# Patient Record
Sex: Female | Born: 1955
Health system: Southern US, Community
[De-identification: ages and names within clinical notes are randomized; demographics above are authoritative.]

## PROBLEM LIST (undated history)

## (undated) DIAGNOSIS — C210 Malignant neoplasm of anus, unspecified: Secondary | ICD-10-CM

## (undated) DIAGNOSIS — C801 Malignant (primary) neoplasm, unspecified: Secondary | ICD-10-CM

## (undated) DIAGNOSIS — Z98811 Dental restoration status: Secondary | ICD-10-CM

## (undated) DIAGNOSIS — K219 Gastro-esophageal reflux disease without esophagitis: Secondary | ICD-10-CM

## (undated) DIAGNOSIS — M65331 Trigger finger, right middle finger: Secondary | ICD-10-CM

## (undated) DIAGNOSIS — Z923 Personal history of irradiation: Secondary | ICD-10-CM

## (undated) HISTORY — PX: TONSILLECTOMY: SUR1361

## (undated) HISTORY — DX: Malignant neoplasm of anus, unspecified: C21.0

## (undated) HISTORY — PX: TUBAL LIGATION: SHX77

## (undated) HISTORY — DX: Gastro-esophageal reflux disease without esophagitis: K21.9

## (undated) HISTORY — PX: ECTOPIC PREGNANCY SURGERY: SHX613

---

## 1999-01-05 ENCOUNTER — Other Ambulatory Visit: Admission: RE | Admit: 1999-01-05 | Discharge: 1999-01-05 | Payer: Self-pay | Admitting: Obstetrics & Gynecology

## 2000-02-07 ENCOUNTER — Other Ambulatory Visit: Admission: RE | Admit: 2000-02-07 | Discharge: 2000-02-07 | Payer: Self-pay | Admitting: Obstetrics & Gynecology

## 2001-10-15 ENCOUNTER — Other Ambulatory Visit: Admission: RE | Admit: 2001-10-15 | Discharge: 2001-10-15 | Payer: Self-pay | Admitting: Obstetrics & Gynecology

## 2002-03-29 ENCOUNTER — Encounter: Payer: Self-pay | Admitting: *Deleted

## 2002-03-29 ENCOUNTER — Ambulatory Visit (HOSPITAL_COMMUNITY): Admission: RE | Admit: 2002-03-29 | Discharge: 2002-03-29 | Payer: Self-pay | Admitting: *Deleted

## 2002-04-11 ENCOUNTER — Encounter: Payer: Self-pay | Admitting: Gastroenterology

## 2002-05-03 ENCOUNTER — Encounter: Payer: Self-pay | Admitting: Gastroenterology

## 2003-05-02 ENCOUNTER — Other Ambulatory Visit: Admission: RE | Admit: 2003-05-02 | Discharge: 2003-05-02 | Payer: Self-pay | Admitting: Obstetrics & Gynecology

## 2003-07-23 ENCOUNTER — Encounter: Payer: Self-pay | Admitting: Gastroenterology

## 2003-09-08 ENCOUNTER — Encounter: Payer: Self-pay | Admitting: Gastroenterology

## 2004-05-18 ENCOUNTER — Other Ambulatory Visit: Admission: RE | Admit: 2004-05-18 | Discharge: 2004-05-18 | Payer: Self-pay | Admitting: Obstetrics & Gynecology

## 2005-09-05 ENCOUNTER — Other Ambulatory Visit: Admission: RE | Admit: 2005-09-05 | Discharge: 2005-09-05 | Payer: Self-pay | Admitting: Obstetrics & Gynecology

## 2005-09-09 ENCOUNTER — Encounter: Payer: Self-pay | Admitting: Gastroenterology

## 2007-12-18 ENCOUNTER — Encounter
Admission: RE | Admit: 2007-12-18 | Discharge: 2007-12-18 | Payer: Self-pay | Admitting: Physical Medicine and Rehabilitation

## 2007-12-27 ENCOUNTER — Encounter
Admission: RE | Admit: 2007-12-27 | Discharge: 2007-12-27 | Payer: Self-pay | Admitting: Physical Medicine and Rehabilitation

## 2009-01-14 LAB — CONVERTED CEMR LAB: Pap Smear: NORMAL

## 2009-04-23 ENCOUNTER — Encounter (INDEPENDENT_AMBULATORY_CARE_PROVIDER_SITE_OTHER): Payer: Self-pay | Admitting: *Deleted

## 2009-04-23 ENCOUNTER — Emergency Department (HOSPITAL_BASED_OUTPATIENT_CLINIC_OR_DEPARTMENT_OTHER): Admission: EM | Admit: 2009-04-23 | Discharge: 2009-04-24 | Payer: Self-pay | Admitting: Emergency Medicine

## 2009-04-24 ENCOUNTER — Encounter (INDEPENDENT_AMBULATORY_CARE_PROVIDER_SITE_OTHER): Payer: Self-pay | Admitting: *Deleted

## 2009-04-24 ENCOUNTER — Ambulatory Visit: Payer: Self-pay | Admitting: Diagnostic Radiology

## 2009-06-08 ENCOUNTER — Ambulatory Visit: Payer: Self-pay | Admitting: Gastroenterology

## 2009-06-08 DIAGNOSIS — Z860100 Personal history of colon polyps, unspecified: Secondary | ICD-10-CM | POA: Insufficient documentation

## 2009-06-18 ENCOUNTER — Ambulatory Visit: Payer: Self-pay | Admitting: Internal Medicine

## 2009-06-18 DIAGNOSIS — K219 Gastro-esophageal reflux disease without esophagitis: Secondary | ICD-10-CM | POA: Insufficient documentation

## 2009-06-18 DIAGNOSIS — R059 Cough, unspecified: Secondary | ICD-10-CM | POA: Insufficient documentation

## 2009-06-18 DIAGNOSIS — R51 Headache: Secondary | ICD-10-CM | POA: Insufficient documentation

## 2009-06-18 DIAGNOSIS — R519 Headache, unspecified: Secondary | ICD-10-CM | POA: Insufficient documentation

## 2009-06-18 DIAGNOSIS — R05 Cough: Secondary | ICD-10-CM

## 2009-06-19 ENCOUNTER — Encounter: Payer: Self-pay | Admitting: Internal Medicine

## 2009-10-14 ENCOUNTER — Encounter: Payer: Self-pay | Admitting: Internal Medicine

## 2010-05-19 ENCOUNTER — Encounter: Payer: Self-pay | Admitting: Internal Medicine

## 2010-05-19 ENCOUNTER — Other Ambulatory Visit: Admission: RE | Admit: 2010-05-19 | Discharge: 2010-05-19 | Payer: Self-pay | Admitting: Internal Medicine

## 2010-05-19 ENCOUNTER — Telehealth: Payer: Self-pay | Admitting: Internal Medicine

## 2010-05-19 ENCOUNTER — Ambulatory Visit: Payer: Self-pay | Admitting: Internal Medicine

## 2010-05-19 DIAGNOSIS — R109 Unspecified abdominal pain: Secondary | ICD-10-CM | POA: Insufficient documentation

## 2010-05-19 LAB — CONVERTED CEMR LAB
BUN: 14 mg/dL (ref 6–23)
Basophils Absolute: 0 10*3/uL (ref 0.0–0.1)
Bilirubin, Direct: 0 mg/dL (ref 0.0–0.3)
Chloride: 108 meq/L (ref 96–112)
Cholesterol: 274 mg/dL — ABNORMAL HIGH (ref 0–200)
Creatinine, Ser: 0.7 mg/dL (ref 0.4–1.2)
Direct LDL: 174.8 mg/dL
Eosinophils Absolute: 0.1 10*3/uL (ref 0.0–0.7)
Leukocytes, UA: NEGATIVE
MCHC: 33.9 g/dL (ref 30.0–36.0)
MCV: 88.3 fL (ref 78.0–100.0)
Monocytes Absolute: 0.6 10*3/uL (ref 0.1–1.0)
Neutrophils Relative %: 63.7 % (ref 43.0–77.0)
Nitrite: NEGATIVE
Platelets: 276 10*3/uL (ref 150.0–400.0)
RDW: 14 % (ref 11.5–14.6)
Total Bilirubin: 0.6 mg/dL (ref 0.3–1.2)
Total Protein, Urine: NEGATIVE mg/dL
VLDL: 17.4 mg/dL (ref 0.0–40.0)
WBC: 6.2 10*3/uL (ref 4.5–10.5)
pH: 5 (ref 5.0–8.0)

## 2010-05-21 ENCOUNTER — Ambulatory Visit: Payer: Self-pay | Admitting: Internal Medicine

## 2010-05-21 LAB — CONVERTED CEMR LAB: Potassium: 4.5 meq/L (ref 3.5–5.1)

## 2010-05-24 ENCOUNTER — Encounter: Payer: Self-pay | Admitting: Internal Medicine

## 2010-08-22 ENCOUNTER — Encounter: Payer: Self-pay | Admitting: Physical Medicine and Rehabilitation

## 2010-09-02 NOTE — Letter (Signed)
Summary: [facility]  [facility]   Imported By: Sherian Rein 10/27/2009 09:12:37  _____________________________________________________________________  External Attachment:    Type:   Image     Comment:   External Document

## 2010-09-02 NOTE — Letter (Signed)
Summary: Lipid Letter  [facility]  [address] [place], [place] [address]   Phone: [phone]  Fax: [phone]    05/19/2010  Emily Compton [address] [place], [place]  [address]  Dear Emily Compton:  We have carefully reviewed your last lipid profile from  and the results are noted below with a summary of recommendations for lipid management.    Cholesterol:       274     Goal: <   HDL "good" Cholesterol:   57.84     Goal: >   LDL "bad" Cholesterol:       Goal: <   Triglycerides:       87.0     Goal: <        TLC Diet (Therapeutic Lifestyle Change): Saturated Fats & Transfatty acids should be kept < 7% of total calories ***Reduce Saturated Fats Polyunstaurated Fat can be up to 10% of total calories Monounsaturated Fat Fat can be up to 20% of total calories Total Fat should be no greater than 25-35% of total calories Carbohydrates should be 50-60% of total calories Protein should be approximately 15% of total calories Fiber should be at least 20-30 grams a day ***Increased fiber may help lower LDL Total Cholesterol should be < 200mg /day Consider adding plant stanol/sterols to diet (example: Benacol spread) ***A higher intake of unsaturated fat may reduce Triglycerides and Increase HDL    Adjunctive Measures (may lower LIPIDS and reduce risk of Heart Attack) include: Aerobic Exercise (20-30 minutes 3-4 times a week) Limit Alcohol Consumption Weight Reduction Aspirin 75-81 mg a day by mouth (if not allergic or contraindicated) Dietary Fiber 20-30 grams a day by mouth     Current Medications: 1)    Diazepam 10 Mg Tabs (Diazepam) .... 1/2  tablet by mouth as needed 2)    Oxycodone-acetaminophen 5-325 Mg Tabs (Oxycodone-acetaminophen) .... 1/2 tablet by mouth as needed for pain 3)    Prilosec Otc 20 Mg Tbec (Omeprazole magnesium) .... One tablet by mouth by mouth as needed for acid reflux  If you have any questions, please call. We appreciate being  able to work with you.   Sincerely,    [facility] Etta Grandchild MD

## 2010-09-02 NOTE — Letter (Signed)
Summary: Results Follow-up Letter  [facility]  [address] [place], [place] [address]   Phone: [phone]  Fax: [phone]    05/24/2010  [address] [place], [place]  [address]  Dear Ms. Ovidio Kin,   The following are the results of your recent test(s):  Test     Result     Pap Smear    Normal___XX____  Not Normal_____       Comments:    _________________________________________________________  Please call for an appointment as directed _________________________________________________________ _________________________________________________________ _________________________________________________________  Sincerely,  Sanda Linger MD [facility]

## 2010-09-02 NOTE — Progress Notes (Signed)
Summary: need lab visit.  ---- Converted from flag ---- ---- 05/19/2010 2:15 PM, Etta Grandchild MD wrote: yes  ---- 05/19/2010 2:15 PM, Rock Nephew CMA wrote: this week?  ---- 05/19/2010 1:53 PM, Etta Grandchild MD wrote: ask her to come in for a repeat K+ level  ---- 05/19/2010 1:50 PM, Rock Nephew CMA wrote: Per lab, pt potassium resulted a 6.3 ------------------------------  Phone Note Outgoing Call   Call placed by: Alysia Penna,  May 19, 2010 2:38 PM Call placed to: Patient Summary of Call: left message with husband to have pt call back.  Initial call taken by: Alysia Penna,  May 19, 2010 2:38 PM  Follow-up for Phone Call        Patient notified per MD and will come back and have lab recheck. Order placed in IDX.Marland KitchenAlvy Beal Archie CMA  May 19, 2010 2:58 PM

## 2010-09-02 NOTE — Letter (Signed)
Summary: Lipid Letter  [facility]  [address] [place], [place] [address]   Phone: [phone]  Fax: [phone]    05/19/2010  Emily Compton [address] [place], [place]  [address]  Dear Emily Compton:  We have carefully reviewed your last lipid profile from  and the results are noted below with a summary of recommendations for lipid management.    Cholesterol:       274     Goal: <200   HDL "good" Cholesterol:   16.01     Goal: >40   LDL "bad" Cholesterol:   175     Goal: <130   Triglycerides:       87.0     Goal: <150        TLC Diet (Therapeutic Lifestyle Change): Saturated Fats & Transfatty acids should be kept < 7% of total calories ***Reduce Saturated Fats Polyunstaurated Fat can be up to 10% of total calories Monounsaturated Fat Fat can be up to 20% of total calories Total Fat should be no greater than 25-35% of total calories Carbohydrates should be 50-60% of total calories Protein should be approximately 15% of total calories Fiber should be at least 20-30 grams a day ***Increased fiber may help lower LDL Total Cholesterol should be < 200mg /day Consider adding plant stanol/sterols to diet (example: Benacol spread) ***A higher intake of unsaturated fat may reduce Triglycerides and Increase HDL    Adjunctive Measures (may lower LIPIDS and reduce risk of Heart Attack) include: Aerobic Exercise (20-30 minutes 3-4 times a week) Limit Alcohol Consumption Weight Reduction Aspirin 75-81 mg a day by mouth (if not allergic or contraindicated) Dietary Fiber 20-30 grams a day by mouth     Current Medications: 1)    Diazepam 10 Mg Tabs (Diazepam) .... 1/2  tablet by mouth as needed 2)    Oxycodone-acetaminophen 5-325 Mg Tabs (Oxycodone-acetaminophen) .... 1/2 tablet by mouth as needed for pain 3)    Prilosec Otc 20 Mg Tbec (Omeprazole magnesium) .... One tablet by mouth by mouth as needed for acid reflux  If you have any questions, please call. We  appreciate being able to work with you.   Sincerely,    [facility] Emily Grandchild MD

## 2010-09-02 NOTE — Assessment & Plan Note (Signed)
Summary: Cpx/will come fasting, wants Chest xray/Cd   Vital Signs:  Patient profile:   55 year old female Menstrual status:  postmenopausal Height:      61 inches Weight:      122 pounds BMI:     23.14 O2 Sat:      98 % on Room air Temp:     97.6 degrees F oral Pulse rate:   68 / minute Pulse rhythm:   regular Resp:     16 per minute BP sitting:   100 / 68  (left arm) Cuff size:   large  Vitals Entered By: Rock Nephew CMA (May 19, 2010 8:13 AM)  O2 Flow:  Room air CC: Patient here for CPX w/ labs, Preventive Care Is Patient Diabetic? No Pain Assessment Patient in pain? no       Does patient need assistance? Functional Status Self care Ambulation Normal     Menstrual Status postmenopausal Last PAP Result Normal   Primary Care Emily Compton:  Etta Grandchild MD  CC:  Patient here for CPX w/ labs and Preventive Care.  History of Present Illness: She returns for a complete physical but she also complains of right posterior flank pain  along the lower rib cage for 2 weeks.  Dyspepsia History:      She has no alarm features of dyspepsia including no history of melena, hematochezia, dysphagia, persistent vomiting, or involuntary weight loss > 5%.  There is a prior history of GERD.  The patient does not have a prior history of documented ulcer disease.  The dominant symptom is heartburn or acid reflux.  An H-2 blocker medication is currently being taken.  She notes that the symptoms have improved with the H-2 blocker therapy.  Symptoms have not persisted after 4 weeks of H-2 blocker treatment.     Current Medications (verified): 1)  Diazepam 10 Mg Tabs (Diazepam) .... 1/2  Tablet By Mouth As Needed 2)  Oxycodone-Acetaminophen 5-325 Mg Tabs (Oxycodone-Acetaminophen) .... 1/2 Tablet By Mouth As Needed For Pain 3)  Prilosec Otc 20 Mg Tbec (Omeprazole Magnesium) .... One Tablet By Mouth By Mouth As Needed For Acid Reflux  Allergies (verified): No Known Drug  Allergies  Past History:  Past Medical History: Last updated: 06/18/2009 Adenomatous Colon Polyps Stenosis in back  Colonic polyps, hx of GERD Headache  Past Surgical History: Last updated: 06/18/2009 fallopian tube surgery  Tonsillectomy  Family History: Last updated: 06/18/2009 No FH of Colon Cancer: Family History Breast cancer 1st degree relative <50 Family History Uterine cancer  Social History: Last updated: 06/08/2009 Occupation: Office---Middle School Married No childern Patient has never smoked.  Alcohol Use - yes: one daily  Daily Caffeine Use: 2 daily  Illicit Drug Use - no  Risk Factors: Alcohol Use: 1 (06/18/2009) >5 drinks/d w/in last 3 months: no (06/18/2009)  Risk Factors: Smoking Status: never (06/18/2009)  Family History: Reviewed history from 06/18/2009 and no changes required. No FH of Colon Cancer: Family History Breast cancer 1st degree relative <50 Family History Uterine cancer  Social History: Reviewed history from 06/08/2009 and no changes required. 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  Alcohol Use - yes: one daily  Daily Caffeine Use: 2 daily  Illicit Drug Use - no  Review of Systems  The patient denies anorexia, fever, weight loss, weight gain, chest pain, syncope, dyspnea on exertion, peripheral edema, headaches, hemoptysis, abdominal pain, melena, hematochezia, severe indigestion/heartburn, hematuria, suspicious skin lesions, difficulty walking, depression, abnormal bleeding,  enlarged lymph nodes, angioedema, and breast masses.   GI:  Denies abdominal pain, bloody stools, constipation, diarrhea, excessive appetite, gas, indigestion, loss of appetite, nausea, vomiting, and yellowish skin color. GU:  Complains of dysuria and urinary hesitancy; denies abnormal vaginal bleeding, decreased libido, discharge, hematuria, incontinence, nocturia, and urinary frequency.  Physical  Exam  General:  alert, well-developed, well-nourished, well-hydrated, appropriate dress, normal appearance, healthy-appearing, cooperative to examination, good hygiene, and overweight-appearing.   Head:  normocephalic, atraumatic, no abnormalities observed, and no abnormalities palpated.   Eyes:  No corneal or conjunctival inflammation noted. EOMI. Perrla. Funduscopic exam benign, without hemorrhages, exudates or papilledema. Vision grossly normal. Mouth:  Oral mucosa and oropharynx without lesions or exudates.  Teeth in good repair. Neck:  supple, full ROM, no masses, no thyromegaly, no thyroid nodules or tenderness, no JVD, normal carotid upstroke, no cervical lymphadenopathy, and no neck tenderness.   Lungs:  normal respiratory effort, no intercostal retractions, no accessory muscle use, normal breath sounds, no dullness, no fremitus, no crackles, and no wheezes.   Heart:  normal rate, regular rhythm, no murmur, no gallop, no rub, and no JVD.   Abdomen:  soft, non-tender, normal bowel sounds, no distention, no masses, no guarding, no rigidity, no rebound tenderness, no abdominal hernia, no inguinal hernia, no hepatomegaly, and no splenomegaly.   Rectal:  No external abnormalities noted. Normal sphincter tone. No rectal masses or tenderness. Genitalia:  Normal introitus for age, no external lesions, no vaginal discharge, mucosa pink and moist, no vaginal or cervical lesions, no vaginal atrophy, no friaility or hemorrhage, normal uterus size and position, no adnexal masses or tenderness Msk:  No deformity or scoliosis noted of thoracic or lumbar spine.   Pulses:  R and L carotid,radial,femoral,dorsalis pedis and posterior tibial pulses are full and equal bilaterally Extremities:  No clubbing, cyanosis, edema, or deformity noted with normal full range of motion of all joints.   Neurologic:  No cranial nerve deficits noted. Station and gait are normal. Plantar reflexes are down-going bilaterally. DTRs  are symmetrical throughout. Sensory, motor and coordinative functions appear intact. Skin:  turgor normal, color normal, no rashes, no suspicious lesions, no ecchymoses, no petechiae, no purpura, no ulcerations, and no edema.   Cervical Nodes:  no anterior cervical adenopathy and no posterior cervical adenopathy.   Axillary Nodes:  no R axillary adenopathy and no L axillary adenopathy.   Inguinal Nodes:  no R inguinal adenopathy and no L inguinal adenopathy.   Psych:  Cognition and judgment appear intact. Alert and cooperative with normal attention span and concentration. No apparent delusions, illusions, hallucinations   Impression & Recommendations:  Problem # 1:  ROUTINE GENERAL MEDICAL EXAM@HEALTH  CARE FACL (ICD-V70.0) Assessment New  Orders: Venipuncture ([address]) TLB-Lipid Panel (80061-LIPID) TLB-BMP (Basic Metabolic Panel-BMET) (80048-METABOL) TLB-CBC Platelet - w/Differential (85025-CBCD) TLB-Hepatic/Liver Function Pnl (80076-HEPATIC) TLB-TSH (Thyroid Stimulating Hormone) (84443-TSH) TLB-Udip w/ Micro (81001-URINE) Hemoccult Guaiac-1 spec.(in office) ([address])  Mammogram: normal (04/01/2010) Pap smear: Normal (01/14/2009) Colonoscopy: Normal (09/09/2005) Td Booster: Historical (08/01/2006)   Flu Vax: Historical (05/12/2010)   Next mammogram due:: 04/2011 (05/19/2010)  Discussed using sunscreen, use of alcohol, drug use, self breast exam, routine dental care, routine eye care, schedule for GYN exam, routine physical exam, seat belts, multiple vitamins, osteoporosis prevention, adequate calcium intake in diet, recommendations for immunizations, mammograms and Pap smears.  Discussed exercise and checking cholesterol.    Problem # 2:  FLANK PAIN, RIGHT (ICD-789.09) Assessment: New will check for UTI, kidney stones, proteinuria, xray for rib  abnormalities Her updated medication list for this problem includes:    Oxycodone-acetaminophen 5-325 Mg Tabs (Oxycodone-acetaminophen) .Marland Kitchen...  1/2 tablet by mouth as needed for pain  Orders: Venipuncture ([address]) TLB-Lipid Panel (80061-LIPID) TLB-BMP (Basic Metabolic Panel-BMET) (80048-METABOL) TLB-CBC Platelet - w/Differential (85025-CBCD) TLB-Hepatic/Liver Function Pnl (80076-HEPATIC) TLB-TSH (Thyroid Stimulating Hormone) (84443-TSH) TLB-Udip w/ Micro (81001-URINE)  Problem # 3:  COUGH (ICD-786.2) Assessment: Improved  Orders: T-2 View CXR (71020TC)  Problem # 4:  GERD (ICD-530.81) Assessment: Improved  The following medications were removed from the medication list:    Hyomax-sl 0.125 Mg Subl (Hyoscyamine sulfate) .Marland Kitchen... Take 2 tablets sublingual q.4 h. p.r.n. Her updated medication list for this problem includes:    Prilosec Otc 20 Mg Tbec (Omeprazole magnesium) ..... One tablet by mouth by mouth as needed for acid reflux  Orders: Venipuncture (98119) TLB-Lipid Panel (80061-LIPID) TLB-BMP (Basic Metabolic Panel-BMET) (80048-METABOL) TLB-CBC Platelet - w/Differential (85025-CBCD) TLB-Hepatic/Liver Function Pnl (80076-HEPATIC) TLB-TSH (Thyroid Stimulating Hormone) (84443-TSH) TLB-Udip w/ Micro (81001-URINE) Hemoccult Guaiac-1 spec.(in office) (82270)  Complete Medication List: 1)  Diazepam 10 Mg Tabs (Diazepam) .... 1/2  tablet by mouth as needed 2)  Oxycodone-acetaminophen 5-325 Mg Tabs (Oxycodone-acetaminophen) .... 1/2 tablet by mouth as needed for pain 3)  Prilosec Otc 20 Mg Tbec (Omeprazole magnesium) .... One tablet by mouth by mouth as needed for acid reflux  Other Orders: Specimen Handling (14782)  Colorectal Screening:  Current Recommendations:    Hemoccult: NEG X 1 today; Given X 3    Colonoscopy recommended: scheduled with G.I.  Colonoscopy Results:    Date of Exam: 09/09/2005    Results: Normal  PAP Screening:    Hx Cervical Dysplasia in last 5 yrs? No    3 normal PAP smears in last 5 yrs? Yes    Last PAP smear:  01/14/2009    Reviewed PAP smear recommendations:  PAP smear  done  Mammogram Screening:    Last Mammogram:  04/01/2010    Reviewed Mammogram recommendations:  mammogram not due yet  Osteoporosis Risk Assessment:  Risk Factors for Fracture or Low Bone Density:   Race (White or Asian):     yes   Hx of Fractures:       no   FH of Osteoporosis:     no   Hx of falls:       no   Physically inactive:     no   Smoking status:       never   High alcohol use:     no   Low calcium/Vit. D intake:   no   Corticosteroid use:     no   Heparin use:       no   Thyroid disease:     no   Rheumatoid Arthritis:     no  Immunization & Chemoprophylaxis:    Tetanus vaccine: Historical  (08/01/2006)    Influenza vaccine: Historical  (05/12/2010)  Patient Instructions: 1)  Please schedule a follow-up appointment in 2 months. 2)  It is important that you exercise regularly at least 20 minutes 5 times a week. If you develop chest pain, have severe difficulty breathing, or feel very tired , stop exercising immediately and seek medical attention. 3)  You need to lose weight. Consider a lower calorie diet and regular exercise.  4)  Schedule your mammogram. 5)  Schedule a colonoscopy/sigmoidoscopy to help detect colon cancer. 6)  You need to have a Pap Smear to prevent cervical cancer.   Orders Added: 1)  T-2 View  CXR [71020TC] 2)  Venipuncture [36415] 3)  TLB-Lipid Panel [80061-LIPID] 4)  TLB-BMP (Basic Metabolic Panel-BMET) [80048-METABOL] 5)  TLB-CBC Platelet - w/Differential [85025-CBCD] 6)  TLB-Hepatic/Liver Function Pnl [80076-HEPATIC] 7)  TLB-TSH (Thyroid Stimulating Hormone) [84443-TSH] 8)  TLB-Udip w/ Micro [81001-URINE] 9)  Specimen Handling [99000] 10)  Hemoccult Guaiac-1 spec.(in office) [82270] 11)  Est. Patient Level IV [[address]] 12)  Est. Patient 40-64 years [99396]   Immunization History:  Influenza Immunization History:    Influenza:  historical (05/12/2010)   Immunization History:  Influenza Immunization History:    Influenza:   Historical (05/12/2010)  Preventive Care Screening  Mammogram:    Date:  04/01/2010    Next Due:  04/2011    Results:  normal

## 2010-09-23 ENCOUNTER — Emergency Department (HOSPITAL_COMMUNITY)
Admission: EM | Admit: 2010-09-23 | Discharge: 2010-09-23 | Disposition: A | Discharge status: Home / Self Care | Payer: BC Managed Care – PPO | Attending: Emergency Medicine | Admitting: Emergency Medicine

## 2010-09-23 DIAGNOSIS — R5381 Other malaise: Secondary | ICD-10-CM | POA: Insufficient documentation

## 2010-09-23 DIAGNOSIS — R002 Palpitations: Secondary | ICD-10-CM | POA: Insufficient documentation

## 2010-09-23 DIAGNOSIS — R42 Dizziness and giddiness: Secondary | ICD-10-CM | POA: Insufficient documentation

## 2010-09-23 LAB — BASIC METABOLIC PANEL
CO2: 25 mEq/L (ref 19–32)
GFR calc Af Amer: >60 mL/min (ref >60)
GFR calc non Af Amer: >60 mL/min (ref >60)
Glucose, Bld: 100 mg/dL — ABNORMAL HIGH (ref 70–99)
Potassium: 3.9 mEq/L (ref 3.5–5.1)
Sodium: 138 mEq/L (ref 135–145)

## 2010-09-23 LAB — POCT CARDIAC MARKERS: CKMB, poc: <1 ng/mL — ABNORMAL LOW (ref 1.0–8.0)

## 2010-09-23 LAB — DIFFERENTIAL
Basophils Absolute: 0 10*3/uL (ref 0.0–0.1)
Eosinophils Relative: 1 % (ref 0–5)
Lymphocytes Relative: 22 % (ref 12–46)
Lymphs Abs: 1.9 10*3/uL (ref 0.7–4.0)
Neutro Abs: 6.1 10*3/uL (ref 1.7–7.7)

## 2010-09-23 LAB — CBC
HCT: 40.2 % (ref 36.0–46.0)
Hemoglobin: 13 g/dL (ref 12.0–15.0)
MCV: 85.9 fL (ref 78.0–100.0)
RDW: 13 % (ref 11.5–15.5)
WBC: 8.8 10*3/uL (ref 4.0–10.5)

## 2010-09-28 ENCOUNTER — Encounter: Payer: Self-pay | Admitting: Internal Medicine

## 2010-10-12 NOTE — Letter (Signed)
Summary: [facility]  [facility]   Imported By: Sherian Rein 10/05/2010 15:02:39  _____________________________________________________________________  External Attachment:    Type:   Image     Comment:   External Document

## 2010-10-29 ENCOUNTER — Ambulatory Visit: Payer: Self-pay | Admitting: Gastroenterology

## 2010-11-05 LAB — DIFFERENTIAL
Basophils Absolute: 0 10*3/uL (ref 0.0–0.1)
Lymphocytes Relative: 36 % (ref 12–46)
Lymphs Abs: 2.3 10*3/uL (ref 0.7–4.0)
Monocytes Absolute: 0.6 10*3/uL (ref 0.1–1.0)
Neutro Abs: 3.5 10*3/uL (ref 1.7–7.7)

## 2010-11-05 LAB — COMPREHENSIVE METABOLIC PANEL
AST: 28 U/L (ref 0–37)
Albumin: 4.5 g/dL (ref 3.5–5.2)
BUN: 12 mg/dL (ref 6–23)
Calcium: 10.2 mg/dL (ref 8.4–10.5)
Chloride: 100 mEq/L (ref 96–112)
Creatinine, Ser: 0.8 mg/dL (ref 0.4–1.2)
GFR calc Af Amer: >60 mL/min (ref >60)
GFR calc non Af Amer: >60 mL/min (ref >60)
Total Bilirubin: 0.5 mg/dL (ref 0.3–1.2)

## 2010-11-05 LAB — URINE CULTURE: Colony Count: 2000

## 2010-11-05 LAB — URINE MICROSCOPIC-ADD ON

## 2010-11-05 LAB — URINALYSIS, ROUTINE W REFLEX MICROSCOPIC
Bilirubin Urine: NEGATIVE
Glucose, UA: NEGATIVE mg/dL
Hgb urine dipstick: NEGATIVE
Specific Gravity, Urine: 1.003 — ABNORMAL LOW (ref 1.005–1.030)
Urobilinogen, UA: 0.2 mg/dL (ref 0.0–1.0)
pH: 6.5 (ref 5.0–8.0)

## 2010-11-05 LAB — CBC
HCT: 40.5 % (ref 36.0–46.0)
MCHC: 33.5 g/dL (ref 30.0–36.0)
MCV: 88.5 fL (ref 78.0–100.0)
Platelets: 274 10*3/uL (ref 150–400)
WBC: 6.5 10*3/uL (ref 4.0–10.5)

## 2010-11-05 LAB — LIPASE, BLOOD: Lipase: 75 U/L (ref 23–300)

## 2010-11-10 ENCOUNTER — Encounter: Payer: Self-pay | Admitting: Internal Medicine

## 2010-11-10 ENCOUNTER — Ambulatory Visit (INDEPENDENT_AMBULATORY_CARE_PROVIDER_SITE_OTHER): Payer: BC Managed Care – PPO | Admitting: Internal Medicine

## 2010-11-10 VITALS — BP 106/72 | HR 59 | Temp 97.8°F | Resp 12 | Ht 61.0 in | Wt 133.2 lb

## 2010-11-10 DIAGNOSIS — M549 Dorsalgia, unspecified: Secondary | ICD-10-CM

## 2010-11-10 DIAGNOSIS — M48061 Spinal stenosis, lumbar region without neurogenic claudication: Secondary | ICD-10-CM

## 2010-11-10 MED ORDER — OXYCODONE-ACETAMINOPHEN 5-325 MG PO TABS: 1.0000 | ORAL_TABLET | Freq: Four times a day (QID) | ORAL | Status: DC | PRN

## 2010-11-10 MED ORDER — DIAZEPAM 10 MG PO TABS: 10.0000 mg | ORAL_TABLET | Freq: Four times a day (QID) | ORAL | Status: DC | PRN

## 2010-11-10 NOTE — Patient Instructions (Signed)
Back Pain & Injury Your back pain is most likely caused by a strain of the muscles or ligaments supporting the spine. Back strains cause pain and trouble moving because of muscle spasms. They may take several weeks to heal. Usually they are better in days.  Treatment for back pain includes:  Rest - Get bed rest as needed over the next day or two. Use a firm mattress and lie on your side with your knees slightly bent. If you lie on your back, put a pillow under your knees.   Early movement - Back pain improves most rapidly if you remain active. It is much more stressful on the back to sit or stand in one place. Do not sit, drive or stand in one place for more than 30 minutes at a time. Take short walks on level surfaces as soon as pain allows.   Limit bending and lifting - Do not bend over or lift anything over 20 pounds until instructed otherwise. Lift by bending your knees. Use your leg muscles to help. Keep the load close to your body and avoid twisting. Do not reach or do overhead work.   Medicines - Medicine to reduce pain and inflammation are helpful. Muscle-relaxing drugs may be prescribed.   Therapy - Put ice packs on your back every few hours for the first 2-3 days after your injury or as instructed. After that ice or heat may be alternated to reduce pain and spasm. Back exercises and gentle massage may be of some benefit. You should be examined again if your back pain is not better in one week.  SEEK IMMEDIATE MEDICAL CARE IF:  You have pain that radiates from your back into your legs.   You develop new bowel or bladder control problems.   You have unusual weakness or numbness in your arms or legs.   You develop nausea or vomiting.   You develop abdominal pain.   You feel faint.  Document Released: 07/18/2005 Document Re-Released: 04/26/2008 [employer] Patient Information 2011 [employer]. 

## 2010-11-10 NOTE — Progress Notes (Signed)
  Subjective:    Patient ID: Emily Compton, female    DOB: September 10, 1955, 54 y.o.   MRN: [id number]  HPI She returns for f/up and she tells me that her pain doctor does not prescribe meds anymore and he sent her to me for pain med prescriptions. Her low back pain is stable by her report and she is getting significant relief with ESI's but she does occasionally take meds for pain and spasm when she is very active.    Review of Systems  Constitutional: Negative for fever, chills, diaphoresis, activity change, appetite change, fatigue and unexpected weight change.  Respiratory: Negative for cough, shortness of breath, wheezing and stridor.   Cardiovascular: Negative for chest pain, palpitations and leg swelling.  Gastrointestinal: Negative for nausea, vomiting, abdominal pain, diarrhea, constipation, blood in stool and abdominal distention.  Genitourinary: Negative for dysuria, urgency, frequency, hematuria, flank pain, decreased urine volume, difficulty urinating and dyspareunia.  Musculoskeletal: Positive for back pain. Negative for joint swelling, arthralgias and gait problem.  Skin: Negative for color change, pallor and rash.  Neurological: Negative for dizziness, tremors, weakness, numbness and headaches.  Hematological: Negative for adenopathy. Does not bruise/bleed easily.  Psychiatric/Behavioral: Negative for hallucinations, behavioral problems, confusion, self-injury, dysphoric mood, decreased concentration and agitation. The patient is not nervous/anxious.    Lab Results  Component Value Date   WBC 8.8 09/23/2010   HGB 13.0 09/23/2010   HCT 40.2 09/23/2010   PLT 247 09/23/2010   CHOL 274* 05/19/2010   TRIG 87.0 05/19/2010   HDL 77.80 05/19/2010   LDLDIRECT 174.8 05/19/2010   ALT 24 05/19/2010   AST 26 05/19/2010   NA 138 09/23/2010   K 3.9 09/23/2010   CL 107 09/23/2010   CREATININE 0.69 09/23/2010   BUN 9 09/23/2010   CO2 25 09/23/2010   TSH 1.45 05/19/2010      Objective:   Physical Exam  Constitutional: She appears well-developed and well-nourished. No distress.  HENT:  Head: Normocephalic and atraumatic.  Right Ear: External ear normal.  Left Ear: External ear normal.  Nose: Nose normal.  Mouth/Throat: No oropharyngeal exudate.  Eyes: Conjunctivae and EOM are normal. Pupils are equal, round, and reactive to light. Right eye exhibits no discharge. Left eye exhibits no discharge. No scleral icterus.  Neck: Normal range of motion. Neck supple. No JVD present. No thyromegaly present.  Cardiovascular: Normal rate, regular rhythm, normal heart sounds and intact distal pulses.  Exam reveals no gallop and no friction rub.   No murmur heard. Pulmonary/Chest: Effort normal and breath sounds normal. No respiratory distress. She has no wheezes. She has no rales. She exhibits no tenderness.  Abdominal: Soft. Bowel sounds are normal. She exhibits no distension and no mass. There is no tenderness. There is no rebound and no guarding.  Musculoskeletal: Normal range of motion. She exhibits no tenderness.  Lymphadenopathy:    She has no cervical adenopathy.  Neurological: She is alert. She has normal reflexes. She displays normal reflexes. She exhibits normal muscle tone. Coordination normal.  Skin: Skin is warm and dry. No rash noted. She is not diaphoretic. No erythema. No pallor.  Psychiatric: She has a normal mood and affect. Her behavior is normal. Judgment and thought content normal.          Assessment & Plan:

## 2010-11-11 DIAGNOSIS — M48061 Spinal stenosis, lumbar region without neurogenic claudication: Secondary | ICD-10-CM | POA: Insufficient documentation

## 2010-11-11 NOTE — Assessment & Plan Note (Signed)
Continue meds for low back pain and let me know if she develops any new or worsening symptoms

## 2010-11-11 NOTE — Assessment & Plan Note (Signed)
Prescriptions were written

## 2011-02-01 ENCOUNTER — Telehealth: Payer: Self-pay

## 2011-02-01 NOTE — Telephone Encounter (Signed)
Patient called requesting refill for pain medication. She asked is there something similar to the OXY 5-325 that does not contain APAP.  She is concerned about too much bleeding. Please advise Thanks

## 2011-02-01 NOTE — Telephone Encounter (Signed)
PT NEEDS RF of OXY 5-325  Spoke w/pt - advised her to keep apt w/GI. Also told her that she is currently taking a low dose of APAP and this is safer than NSAID w/her concern about rectal bleeding. She sometimes takes asprin when oxy does not cover pain - suggested she may want to try tylenol (not to exceed 3000 mg/day) in place of Asprin w/current concerns. She will keep apt with GI and call office w/further concerns.

## 2011-02-02 ENCOUNTER — Other Ambulatory Visit: Payer: Self-pay | Admitting: Internal Medicine

## 2011-02-02 DIAGNOSIS — M549 Dorsalgia, unspecified: Secondary | ICD-10-CM

## 2011-02-02 DIAGNOSIS — M48061 Spinal stenosis, lumbar region without neurogenic claudication: Secondary | ICD-10-CM

## 2011-02-02 MED ORDER — OXYCODONE-ACETAMINOPHEN 5-325 MG PO TABS: 1.0000 | ORAL_TABLET | Freq: Four times a day (QID) | ORAL | Status: DC | PRN

## 2011-02-02 NOTE — Telephone Encounter (Signed)
done

## 2011-02-03 NOTE — Telephone Encounter (Signed)
Patient informed. 

## 2011-02-09 ENCOUNTER — Telehealth: Payer: Self-pay | Admitting: *Deleted

## 2011-02-09 NOTE — Telephone Encounter (Signed)
Pt left vm - Says that pharmacy has a problem/question w/percocet RX written last week. Spoke w/pharm and clarified sig per what patient takes 1/2 to 1 q 6 prn.

## 2011-02-14 ENCOUNTER — Ambulatory Visit
Admission: RE | Admit: 2011-02-14 | Discharge: 2011-02-14 | Disposition: A | Discharge status: Home / Self Care | Payer: BC Managed Care – PPO | Source: Ambulatory Visit | Attending: Orthopedic Surgery | Admitting: Orthopedic Surgery

## 2011-02-14 ENCOUNTER — Other Ambulatory Visit: Payer: Self-pay | Admitting: Orthopedic Surgery

## 2011-02-14 DIAGNOSIS — M545 Low back pain, unspecified: Secondary | ICD-10-CM

## 2011-02-21 ENCOUNTER — Ambulatory Visit: Payer: Self-pay | Admitting: Gastroenterology

## 2011-02-24 ENCOUNTER — Encounter (HOSPITAL_COMMUNITY)
Admission: RE | Admit: 2011-02-24 | Discharge: 2011-02-24 | Disposition: A | Discharge status: Home / Self Care | Payer: BC Managed Care – PPO | Source: Ambulatory Visit | Attending: Orthopedic Surgery | Admitting: Orthopedic Surgery

## 2011-02-24 LAB — CBC
HCT: 41.7 % (ref 36.0–46.0)
Hemoglobin: 14.1 g/dL (ref 12.0–15.0)
MCV: 84.2 fL (ref 78.0–100.0)
WBC: 6.7 10*3/uL (ref 4.0–10.5)

## 2011-02-24 LAB — URINALYSIS, ROUTINE W REFLEX MICROSCOPIC
Bilirubin Urine: NEGATIVE
Hgb urine dipstick: NEGATIVE
Ketones, ur: NEGATIVE mg/dL
Nitrite: NEGATIVE
Protein, ur: NEGATIVE mg/dL
Urobilinogen, UA: 0.2 mg/dL (ref 0.0–1.0)

## 2011-02-24 LAB — PROTIME-INR
INR: 0.95 (ref 0.00–1.49)
Prothrombin Time: 12.9 seconds (ref 11.6–15.2)

## 2011-02-24 LAB — DIFFERENTIAL
Basophils Absolute: 0 10*3/uL (ref 0.0–0.1)
Lymphocytes Relative: 33 % (ref 12–46)
Lymphs Abs: 2.2 10*3/uL (ref 0.7–4.0)
Neutro Abs: 3.4 10*3/uL (ref 1.7–7.7)

## 2011-02-24 LAB — COMPREHENSIVE METABOLIC PANEL
Albumin: 4.1 g/dL (ref 3.5–5.2)
Alkaline Phosphatase: 93 U/L (ref 39–117)
BUN: 11 mg/dL (ref 6–23)
Calcium: 10.4 mg/dL (ref 8.4–10.5)
Creatinine, Ser: 0.75 mg/dL (ref 0.50–1.10)
GFR calc Af Amer: >60 mL/min (ref >60)
Glucose, Bld: 93 mg/dL (ref 70–99)
Potassium: 4 mEq/L (ref 3.5–5.1)
Total Protein: 7.3 g/dL (ref 6.0–8.3)

## 2011-02-24 LAB — APTT: aPTT: 29 seconds (ref 24–37)

## 2011-02-24 LAB — TYPE AND SCREEN
ABO/RH(D): A POS
Antibody Screen: NEGATIVE

## 2011-02-24 LAB — SURGICAL PCR SCREEN: MRSA, PCR: NEGATIVE

## 2011-02-25 LAB — URINE CULTURE: Culture: NO GROWTH

## 2011-03-02 ENCOUNTER — Inpatient Hospital Stay (HOSPITAL_COMMUNITY): Payer: BC Managed Care – PPO

## 2011-03-02 ENCOUNTER — Inpatient Hospital Stay (HOSPITAL_COMMUNITY)
Admission: RE | Admit: 2011-03-02 | Discharge: 2011-03-04 | DRG: 755 | Disposition: A | Discharge status: Home / Self Care | Payer: BC Managed Care – PPO | Source: Ambulatory Visit | Attending: Orthopedic Surgery | Admitting: Orthopedic Surgery

## 2011-03-02 DIAGNOSIS — Q762 Congenital spondylolisthesis: Secondary | ICD-10-CM

## 2011-03-02 DIAGNOSIS — J449 Chronic obstructive pulmonary disease, unspecified: Secondary | ICD-10-CM | POA: Diagnosis present

## 2011-03-02 DIAGNOSIS — Z01812 Encounter for preprocedural laboratory examination: Secondary | ICD-10-CM

## 2011-03-02 DIAGNOSIS — J4489 Other specified chronic obstructive pulmonary disease: Secondary | ICD-10-CM | POA: Diagnosis present

## 2011-03-02 DIAGNOSIS — K219 Gastro-esophageal reflux disease without esophagitis: Secondary | ICD-10-CM | POA: Diagnosis present

## 2011-03-02 DIAGNOSIS — IMO0002 Reserved for concepts with insufficient information to code with codable children: Secondary | ICD-10-CM | POA: Diagnosis present

## 2011-03-02 DIAGNOSIS — M48061 Spinal stenosis, lumbar region without neurogenic claudication: Principal | ICD-10-CM | POA: Diagnosis present

## 2011-03-02 HISTORY — PX: LUMBAR FUSION: SHX111

## 2011-03-08 NOTE — Op Note (Signed)
NAME:  Emily Compton, Emily Compton NO.:  [id number]  MEDICAL RECORD NO.:  [id number]  LOCATION:  5021                         FACILITY:  [facility]  PHYSICIAN:  Estill Bamberg, MD      DATE OF BIRTH:  03/14/1956  DATE OF PROCEDURE:  03/02/2011 DATE OF DISCHARGE:                              OPERATIVE REPORT   PREOPERATIVE DIAGNOSES: 1. L4-L5 spinal stenosis. 2. Grade 1 L4-L5 spondylolisthesis. 3. Lateral recess and neural foraminal stenosis on the right side at     L4-L5, causing left-sided L4 and L5 radiculopathy.  POSTOPERATIVE DIAGNOSES: 1. L4-L5 spinal stenosis. 2. Grade 1 L4-L5 spondylolisthesis. 3. Lateral recess and neural foraminal stenosis on the right side at     L4-L5, causing left-sided L4 and L5 radiculopathy.  PROCEDURES: 1. Right-sided transforaminal lumbar interbody fusion L4-L5. 2. Insertion of intervertebral device L4-L5. 3. Placement of posterior instrumentation L4, L5. 4. Left-sided posterolateral fusion L4-L5. 5. Use of local autograft. 6. Intraoperative bone marrow aspiration using a separate incision     from the patient's left iliac crest. 7. Intraoperative use of fluoroscopy.  SURGEON:  Estill Bamberg, MD  ASSISTANT:  Janace Litten, OPA  ANESTHESIA:  General endotracheal anesthesia.  COMPLICATIONS:  None.  DISPOSITION:  Stable.  ESTIMATED BLOOD LOSS:  200 mL.  DRAINS:  Blake x1.  INDICATIONS FOR PROCEDURE:  Briefly, Ms. Osterberg is an extremely pleasant 55 year old female who initially presented to my office in February 2012, with right greater than left leg pain.  The patient was treated with conservative measures including injections, anti- inflammatories, and physical therapy.  She did go on to have improvement of her pain, but then her pain did recur to a rather severe degree.  I did review an MRI which is notable for a grade 1 L4-L5 spondylolisthesis with abundant fluid noted in the L4-L5 facet joints bilaterally.  There was also  noted to be a right-sided L4-L5 disk herniation causing lateral recess neural foraminal stenosis in addition to spinal stenosis.  After extensive discussion with the patient, we did agree to go forward with a right-sided L4-L5 transforaminal lumbar interbody fusion.  The patient fully understood the risks and limitations of the procedure as outlined in my preoperative note.  OPERATIVE DETAILS:  On March 02, 2011, the patient was brought to Surgery and general endotracheal anesthesia was administered.  The patient was placed prone on a well-padded Wilson frame.  SCDs were placed.  All bony prominences were meticulously padded.  Ancef was given.  The back was then prepped and draped in usual sterile fashion. I did place two 18-gauge spinal needles over approximately the L4 and L5 spinous processes.  This did help me optimize the location of my incision.  I then made an incision from approximately spinous process of L3 to approximately spinous process of L5.  The paraspinal musculature was gently swept laterally bilaterally.  Self-retaining retractors were placed.  I then exposed the posterior elements of L4-L5 and the transverse processes of L4 and L5 bilaterally.  It was readily noted that the bilateral facet joints were noted to be significantly hypertrophied.  The overgrowth of the facet joints were removed using a rongeur.  The posterolateral gutters were then packed with  Ray-Tec soaked with thrombin.  I then went forward with cannulating the L4 and L5 screws.  I did use a 4-mm bur to gain access to the pedicles on both the right and the left sides.  I then used a curved gearshift probe and at the L4  pedicle, I tapped the hole using a 5-mm tap.  A 6-mm tap was used at L5.  I then placed bone wax over the holes.  This was done bilaterally.  I then turned my attention towards the decompressive aspect of the procedure.  Using an osteotome, I did remove the inferior aspect of L4 and the  inferior articular process of L4.  I then used a Kerrison to remove the superior articular process of L5.  The ligamentum flavum was then removed.  After doing so, I was readily able to identify the intervertebral disk and the traversing L5 nerve and the exiting L4 nerve.  It was also readily apparent that there was a large-sized disk herniation located in the neural foraminal region and this was removed using a pituitary.  With assistant holding medial retraction on the traversing L5 nerve, I did use a 15 blade knife to perform an annulotomy and I did use a series of curettes and paddle scrapers to perform a thorough diskectomy.  Various pituitaries were used remove to multiple fragments of disk.  I was very happy with the final diskectomy and I did encounter punctate bleeding bone at the L4 and L5 endplates.  I then went forward to placing a series of trials.  I did feel that a 13-mm trial would be the most appropriate fit.  At this point, I made a separate incision over the patient's left iliac crest and a total of 16 mL of bone marrow aspirate was obtained.  This was mixed with 20 mL of Vitoss BA.  The Vitoss BA was mixed with the autograft obtained from the decompression and the intervertebral space was abundantly packed with bone graft and Vitoss BA with bone marrow aspirate.  I then packed the 13-mm interbody spacer with the bone marrow aspirate/Vitoss/autograft mixture and this was tamped into position under live fluoroscopy.  I was very happy with the final press fit.  I then packed the remainder of the interbody space with the Vitoss and autograft.  I then placed L4 and L5 screws on the right side.  6 x 45-mm screws were placed at L4 and 7 x 45- mm screws were placed at L5.  I then placed a 35-mm rod and a gentle compression was applied across the rod and a final tightening procedure was performed.  I then turned my attention towards the patient's left side.  At this point, I  copiously irrigated the wound with approximately 1 L of normal saline.  I then used a high-speed bur to decorticate the transverse processes of L4 and L5 in addition to the posterior elements of L4 and L5 in the lateral aspect of the pars interarticularis.  I then placed L4 and L5 screws as previously noted.  Prior to doing so, I did place remainder of the Vitoss/bone marrow aspirate/autograft mixture in the posterolateral gutter and across the posterior elements.  I then placed the screws and a 35-mm rod was placed.  I did perform a gentle compression maneuver across the interspace and a final locking procedure was performed.  I was very happy with the final appearance of the construct on both AP and lateral fluoroscopic views.  At this point, a  Blake drain was placed overlying the epidural space.  A #1 Vicryl was used to close the fascia, 2-0 Vicryl was used to close the subcutaneous layer, and 3-0 Monocryl was used to close the skin and the separate incision for the iliac crest on the left side.  I was very happy with the final appearance on both AP and lateral fluoroscopic views.  All instrument counts were correct at the termination of the procedure.  Of note, Janace Litten was my assistant throughout the procedure and aided in essential retraction and suctioning required throughout the procedure.     Estill Bamberg, MD     MD/MEDQ  D:  03/02/2011  T:  03/02/2011  Job:  [id number]  cc:   Sanda Linger, MD  Electronically Signed by Estill Bamberg  on 03/08/2011 05:44:50 PM

## 2011-03-08 NOTE — Discharge Summary (Signed)
  NAME:  Emily Compton, Emily Compton NO.:  [id number]  MEDICAL RECORD NO.:  [id number]  LOCATION:  5021                         FACILITY:  [facility]  PHYSICIAN:  Estill Bamberg, MD      DATE OF BIRTH:  August 06, 1955  DATE OF ADMISSION:  03/02/2011 DATE OF DISCHARGE:                              DISCHARGE SUMMARY   ADMISSION DIAGNOSIS:  L4-5 spinal stenosis with a grade 1 L4-5 spondylolisthesis.  DISCHARGE DIAGNOSIS:  L4-5 spinal stenosis with a grade 1 L4-5 spondylolisthesis.  ADMITTING PHYSICIAN:  Estill Bamberg, MD  ADMISSION HISTORY:  Briefly, [name] is a pleasant 55 year old female, who presented to me with severe pain in bilateral legs.  This was felt to be secondary to spinal stenosis.  We did go forward with conservative care and the patient did improve.  However, the patient went on to have a recurrence of her left-sided leg pain.  After extensive conservative care, we did make a decision to go forward with surgical intervention.  Specifically, the patient was admitted on March 02, 2011, for a left-sided L4-5 transforaminal lumbar interbody fusion with instrumentation.  HOSPITAL COURSE:  On March 02, 2011, the patient was admitted to the hospital and underwent the procedure noted above.  The patient tolerated the procedure well and was transferred to recovery in stable condition. On the morning of postoperative day #1, the patient reported complete resolution of her left leg pain.  She did have some minimal back discomfort, but did make a excellent gains with physical therapy.  On the morning of postoperative day #2, the patient was noted to be neurovascularly intact and her pain was very well-controlled with oral pain medications.  The patient was doing extremely well with physical therapy and we did make a decision to go ahead and discharge her home on the morning of postoperative day #2.  DISCHARGE INSTRUCTIONS:  The patient will take Percocet for pain  and Valium for spasms.  She will adhere to back precautions at all times.  I will see her back in my office in approximately 2 weeks for followup. She understands to contact me at any point prior to then with any questions or concerns.     Estill Bamberg, MD     MD/MEDQ  D:  03/04/2011  T:  03/04/2011  Job:  [id number]  cc:   Sanda Linger, MD  Electronically Signed by Estill Bamberg  on 03/08/2011 05:44:51 PM

## 2011-05-10 ENCOUNTER — Encounter: Payer: Self-pay | Admitting: Gastroenterology

## 2011-05-10 ENCOUNTER — Ambulatory Visit (INDEPENDENT_AMBULATORY_CARE_PROVIDER_SITE_OTHER): Payer: BC Managed Care – PPO | Admitting: Gastroenterology

## 2011-05-10 DIAGNOSIS — K625 Hemorrhage of anus and rectum: Secondary | ICD-10-CM | POA: Insufficient documentation

## 2011-05-10 DIAGNOSIS — M549 Dorsalgia, unspecified: Secondary | ICD-10-CM

## 2011-05-10 MED ORDER — PEG-KCL-NACL-NASULF-NA ASC-C 100 G PO SOLR: 1.0000 | Freq: Once | ORAL | Status: DC

## 2011-05-10 NOTE — Assessment & Plan Note (Signed)
Improve following surgery for spinal stenosis

## 2011-05-10 NOTE — Patient Instructions (Signed)
Colonoscopy A colonoscopy is an exam to evaluate your entire colon. In this exam, your colon is cleansed. A long fiberoptic tube is inserted through your rectum and into your colon. The fiberoptic scope (endoscope) is a long bundle of enclosed and very flexible fibers. These fibers transmit light to the area examined and send images from that area to your caregiver. Discomfort is usually minimal. You may be given a drug to help you sleep (sedative) during or prior to the procedure. This exam helps to detect lumps (tumors), polyps, inflammation, and areas of bleeding. Your caregiver may also take a small piece of tissue (biopsy) that will be examined under a microscope. BEFORE THE PROCEDURE  A clear liquid diet may be required for 2 days before the exam.   Liquid injections (enemas) or laxatives may be required.   A large amount of electrolyte solution may be given to you to drink over a short period of time. This solution is used to clean out your colon.   You should be present 1 prior to your procedure or as directed by your caregiver.   Check in at the admissions desk to fill out necessary forms if not preregistered. There will be consent forms to sign prior to the procedure. If accompanied by friends or family, there is a waiting area for them while you are having your procedure.  LET YOUR CAREGIVER KNOW ABOUT:  Allergies to food or medicine.  Medicines taken, including vitamins, herbs, eyedrops, over-the-counter medicines, and creams.   Use of steroids (by mouth or creams).   Previous problems with anesthetics or numbing medicines.   History of bleeding problems or blood clots.  Previous surgery.   Other health problems, including diabetes and kidney problems.   Possibility of pregnancy, if this applies.   AFTER THE PROCEDURE  If you received a sedative and/or pain medicine, you will need to arrange for someone to drive you home.   Occasionally, there is a little blood passed  with the first bowel movement. DO NOT be concerned.  HOME CARE INSTRUCTIONS  It is not unusual to pass moderate amounts of gas and experience mild abdominal cramping following the procedure. This is due to air being used to inflate your colon during the exam. Walking or a warm pack on your belly (abdomen) may help.   You may resume all normal meals and activities after sedatives and medicines have worn off.   Only take over-the-counter or prescription medicines for pain, discomfort, or fever as directed by your caregiver. DO NOT use aspirin or blood thinners if a biopsy was taken. Consult your caregiver for medicine usage if biopsies were taken.  FINDING OUT THE RESULTS OF YOUR TEST Not all test results are available during your visit. If your test results are not back during the visit, make an appointment with your caregiver to find out the results. Do not assume everything is normal if you have not heard from your caregiver or the medical facility. It is important for you to follow up on all of your test results. SEEK IMMEDIATE MEDICAL CARE IF:  You have an oral temperature above 100, not controlled by medicine.   You pass large blood clots or fill a toilet with blood following the procedure. This may also occur 10 to 14 days following the procedure. This is more likely if a biopsy was taken.   You develop abdominal pain that keeps getting worse and cannot be relieved with medicine.  Document Released: 07/15/2000 Document Re-Released: 10/12/2009   [employer] Patient Information 2011 [employer]. 

## 2011-05-10 NOTE — Progress Notes (Signed)
History of Present Illness:  Mrs. Longhi has returned for evaluation of rectal bleeding.   Over the past 6 months she has noted intermittent bright red blood per rectum consisting of blood on the toilet tissue. She is without abdominal or rectal pain or change in bowel habits. She has a history of adenomatous colon polyps. Last colonoscopy 2007 was normal. She has requested followup colonoscopy for bleeding.    Review of Systems: Pertinent positive and negative review of systems were noted in the above HPI section. All other review of systems were otherwise negative.    Current Medications, Allergies, Past Medical History, Past Surgical History, Family History and Social History were reviewed in [employer] [facility] electronic medical record  Vital signs were reviewed in today's medical record. Physical Exam: General: Well developed , well nourished, no acute distress Head: Normocephalic and atraumatic Eyes:  sclerae anicteric, EOMI Ears: Normal auditory acuity Mouth: No deformity or lesions Lungs: Clear throughout to auscultation Heart: Regular rate and rhythm; no murmurs, rubs or bruits Abdomen: Soft, non tender and non distended. No masses, hepatosplenomegaly or hernias noted. Normal Bowel sounds Rectal: There are no external rectal abnormalities Musculoskeletal: Symmetrical with no gross deformities  Pulses:  Normal pulses noted Extremities: No clubbing, cyanosis, edema or deformities noted Neurological: Alert oriented x 4, grossly nonfocal Psychological:  Alert and cooperative. Normal mood and affect

## 2011-05-10 NOTE — Assessment & Plan Note (Signed)
Patient's bleeding is most likely due to hemorrhoids. In view of the patient's history of adenomatous polyps and her request for colonoscopy I will proceed with this procedure.  Risks, alternatives, and complications of the procedure, including bleeding, perforation, and possible need for surgery, were explained to the patient.  Patient's questions were answered.

## 2011-05-11 ENCOUNTER — Other Ambulatory Visit: Payer: Self-pay | Admitting: Dermatology

## 2011-05-12 LAB — HM MAMMOGRAPHY: HM Mammogram: NORMAL

## 2011-06-07 ENCOUNTER — Encounter: Payer: Self-pay | Admitting: Gastroenterology

## 2011-06-07 ENCOUNTER — Ambulatory Visit (AMBULATORY_SURGERY_CENTER): Payer: BC Managed Care – PPO | Admitting: Gastroenterology

## 2011-06-07 DIAGNOSIS — Z1211 Encounter for screening for malignant neoplasm of colon: Secondary | ICD-10-CM

## 2011-06-07 DIAGNOSIS — Z86010 Personal history of colonic polyps: Secondary | ICD-10-CM

## 2011-06-07 DIAGNOSIS — M549 Dorsalgia, unspecified: Secondary | ICD-10-CM

## 2011-06-07 DIAGNOSIS — K5730 Diverticulosis of large intestine without perforation or abscess without bleeding: Secondary | ICD-10-CM

## 2011-06-07 DIAGNOSIS — K625 Hemorrhage of anus and rectum: Secondary | ICD-10-CM

## 2011-06-07 MED ORDER — SODIUM CHLORIDE 0.9 % IV SOLN: 500.0000 mL | INTRAVENOUS | Status: DC

## 2011-06-07 MED ORDER — HYDROCORTISONE ACETATE 25 MG RE SUPP: 25.0000 mg | Freq: Two times a day (BID) | RECTAL | Status: DC

## 2011-06-08 ENCOUNTER — Telehealth: Payer: Self-pay

## 2011-06-08 NOTE — Telephone Encounter (Signed)
Left message

## 2011-08-04 ENCOUNTER — Ambulatory Visit (INDEPENDENT_AMBULATORY_CARE_PROVIDER_SITE_OTHER)
Admission: RE | Admit: 2011-08-04 | Discharge: 2011-08-04 | Disposition: A | Discharge status: Home / Self Care | Payer: BC Managed Care – PPO | Source: Ambulatory Visit | Attending: Internal Medicine | Admitting: Internal Medicine

## 2011-08-04 ENCOUNTER — Telehealth: Payer: Self-pay | Admitting: Internal Medicine

## 2011-08-04 ENCOUNTER — Encounter: Payer: Self-pay | Admitting: Internal Medicine

## 2011-08-04 ENCOUNTER — Ambulatory Visit (INDEPENDENT_AMBULATORY_CARE_PROVIDER_SITE_OTHER): Payer: BC Managed Care – PPO | Admitting: Internal Medicine

## 2011-08-04 VITALS — BP 118/74 | HR 96 | Temp 99.0°F | Resp 16

## 2011-08-04 DIAGNOSIS — J209 Acute bronchitis, unspecified: Secondary | ICD-10-CM

## 2011-08-04 DIAGNOSIS — R05 Cough: Secondary | ICD-10-CM | POA: Insufficient documentation

## 2011-08-04 DIAGNOSIS — Z23 Encounter for immunization: Secondary | ICD-10-CM

## 2011-08-04 DIAGNOSIS — R059 Cough, unspecified: Secondary | ICD-10-CM | POA: Insufficient documentation

## 2011-08-04 MED ORDER — CEFUROXIME AXETIL 500 MG PO TABS: 500.0000 mg | ORAL_TABLET | Freq: Two times a day (BID) | ORAL | Status: AC

## 2011-08-04 MED ORDER — PSEUDOEPH-CHLORPHEN-HYDROCOD 60-4-5 MG/5ML PO SOLN: 5.0000 mL | Freq: Four times a day (QID) | ORAL | Status: DC | PRN

## 2011-08-04 NOTE — Telephone Encounter (Signed)
yes

## 2011-08-04 NOTE — Telephone Encounter (Signed)
The pt called and is requesting a same day appt for fever and cough.  Please advise if i can double book.   Thanks!

## 2011-08-04 NOTE — Patient Instructions (Signed)

## 2011-08-05 ENCOUNTER — Encounter: Payer: Self-pay | Admitting: Internal Medicine

## 2011-08-05 NOTE — Assessment & Plan Note (Signed)
Start ceftin for the infection and zutripro for the cough

## 2011-08-05 NOTE — Progress Notes (Signed)
Subjective:    Patient ID: Emily Compton, female    DOB: [birth year], 56 y.o.   MRN: [id number]  Cough This is a new problem. Episode onset: for 2-3 weeks. The problem has been gradually worsening. The problem occurs every few hours. The cough is productive of purulent sputum. Associated symptoms include chills, a fever, myalgias, nasal congestion, postnasal drip, a sore throat and sweats. Pertinent negatives include no chest pain, ear congestion, ear pain, headaches, heartburn, hemoptysis, rash, rhinorrhea, shortness of breath, weight loss or wheezing. The symptoms are aggravated by nothing. She has tried OTC cough suppressant for the symptoms. The treatment provided no relief.      Review of Systems  Constitutional: Positive for fever and chills. Negative for weight loss, diaphoresis, activity change, appetite change, fatigue and unexpected weight change.  HENT: Positive for congestion, sore throat, voice change and postnasal drip. Negative for hearing loss, ear pain, facial swelling, rhinorrhea, sneezing, drooling, mouth sores, trouble swallowing, neck pain, neck stiffness, dental problem, sinus pressure, tinnitus and ear discharge.   Eyes: Negative.   Respiratory: Positive for cough. Negative for apnea, hemoptysis, choking, chest tightness, shortness of breath, wheezing and stridor.   Cardiovascular: Negative for chest pain, palpitations and leg swelling.  Gastrointestinal: Negative for heartburn, nausea, vomiting, abdominal pain, diarrhea, constipation and blood in stool.  Genitourinary: Negative for dysuria, urgency, frequency, hematuria, decreased urine volume, enuresis, difficulty urinating and dyspareunia.  Musculoskeletal: Positive for myalgias. Negative for back pain, joint swelling, arthralgias and gait problem.  Skin: Negative for color change, pallor, rash and wound.  Neurological: Negative for dizziness, tremors, seizures, syncope, facial asymmetry, speech difficulty,  weakness, light-headedness, numbness and headaches.  Hematological: Negative for adenopathy. Does not bruise/bleed easily.  Psychiatric/Behavioral: Negative.        Objective:   Physical Exam  Vitals reviewed. Constitutional: She is oriented to person, place, and time. She appears well-developed and well-nourished. No distress.  HENT:  Head: Normocephalic and atraumatic. No trismus in the jaw.  Right Ear: Hearing, tympanic membrane, external ear and ear canal normal.  Left Ear: Hearing, tympanic membrane, external ear and ear canal normal.  Nose: Mucosal edema and rhinorrhea present. No nose lacerations, sinus tenderness, nasal deformity, septal deviation or nasal septal hematoma. No epistaxis.  No foreign bodies. Right sinus exhibits no maxillary sinus tenderness and no frontal sinus tenderness. Left sinus exhibits no maxillary sinus tenderness and no frontal sinus tenderness.  Mouth/Throat: Mucous membranes are normal. Mucous membranes are not pale, not dry and not cyanotic. No uvula swelling. Posterior oropharyngeal erythema present. No oropharyngeal exudate, posterior oropharyngeal edema or tonsillar abscesses.  Eyes: Conjunctivae are normal. Right eye exhibits no discharge. Left eye exhibits no discharge. No scleral icterus.  Neck: Normal range of motion. Neck supple. No JVD present. No tracheal deviation present. No thyromegaly present.  Cardiovascular: Normal rate, regular rhythm, normal heart sounds and intact distal pulses.  Exam reveals no gallop and no friction rub.   No murmur heard. Pulmonary/Chest: Effort normal and breath sounds normal. No stridor. No respiratory distress. She has no wheezes. She has no rales. She exhibits no tenderness.  Abdominal: Soft. Bowel sounds are normal. She exhibits no distension and no mass. There is no tenderness. There is no rebound and no guarding.  Musculoskeletal: Normal range of motion. She exhibits no edema and no tenderness.  Lymphadenopathy:      She has no cervical adenopathy.  Neurological: She is oriented to person, place, and time.  Skin: Skin is warm and  dry. No rash noted. She is not diaphoretic. No erythema. No pallor.  Psychiatric: She has a normal mood and affect. Her behavior is normal. Judgment and thought content normal.          Assessment & Plan:

## 2011-08-05 NOTE — Assessment & Plan Note (Signed)
I will check a CXR to look for pna, mass, edema, etc. 

## 2011-09-07 ENCOUNTER — Other Ambulatory Visit: Payer: Self-pay | Admitting: Internal Medicine

## 2011-09-08 ENCOUNTER — Telehealth: Payer: Self-pay | Admitting: *Deleted

## 2011-09-08 MED ORDER — DIAZEPAM 10 MG PO TABS: 10.0000 mg | ORAL_TABLET | Freq: Four times a day (QID) | ORAL | Status: DC | PRN

## 2011-09-08 NOTE — Telephone Encounter (Signed)
Pt called left msg on vm pharmacy stated diazepam was denied and she was wanting to know reason. Ask Dr. Yetta Barre pt last seen md 08/04/11. Per md was ok to call in 6 months worth.Called pharmacy spoke with Eugi/pharmacist and, notified pt will send refills into pharmacy... 09/08/11@2 :58pm/LMB

## 2011-09-27 ENCOUNTER — Other Ambulatory Visit: Payer: Self-pay | Admitting: Gastroenterology

## 2011-12-31 DIAGNOSIS — M65331 Trigger finger, right middle finger: Secondary | ICD-10-CM

## 2011-12-31 HISTORY — DX: Trigger finger, right middle finger: M65.331

## 2012-01-03 ENCOUNTER — Other Ambulatory Visit: Payer: Self-pay | Admitting: Orthopedic Surgery

## 2012-01-05 ENCOUNTER — Encounter (HOSPITAL_BASED_OUTPATIENT_CLINIC_OR_DEPARTMENT_OTHER): Payer: Self-pay | Admitting: *Deleted

## 2012-01-05 ENCOUNTER — Other Ambulatory Visit: Payer: Self-pay | Admitting: Orthopedic Surgery

## 2012-01-12 ENCOUNTER — Encounter (HOSPITAL_BASED_OUTPATIENT_CLINIC_OR_DEPARTMENT_OTHER): Payer: Self-pay | Admitting: Anesthesiology

## 2012-01-12 ENCOUNTER — Encounter (HOSPITAL_BASED_OUTPATIENT_CLINIC_OR_DEPARTMENT_OTHER): Payer: Self-pay | Admitting: Orthopedic Surgery

## 2012-01-12 ENCOUNTER — Encounter (HOSPITAL_BASED_OUTPATIENT_CLINIC_OR_DEPARTMENT_OTHER): Payer: Self-pay | Admitting: *Deleted

## 2012-01-12 ENCOUNTER — Ambulatory Visit (HOSPITAL_BASED_OUTPATIENT_CLINIC_OR_DEPARTMENT_OTHER)
Admission: RE | Admit: 2012-01-12 | Discharge: 2012-01-12 | Disposition: A | Discharge status: Home / Self Care | Payer: BC Managed Care – PPO | Source: Ambulatory Visit | Attending: Orthopedic Surgery | Admitting: Orthopedic Surgery

## 2012-01-12 ENCOUNTER — Encounter (HOSPITAL_BASED_OUTPATIENT_CLINIC_OR_DEPARTMENT_OTHER)
Admission: RE | Disposition: A | Discharge status: Home / Self Care | Payer: Self-pay | Source: Ambulatory Visit | Attending: Orthopedic Surgery

## 2012-01-12 ENCOUNTER — Ambulatory Visit (HOSPITAL_BASED_OUTPATIENT_CLINIC_OR_DEPARTMENT_OTHER): Payer: BC Managed Care – PPO | Admitting: Anesthesiology

## 2012-01-12 DIAGNOSIS — M6530 Trigger finger, unspecified finger: Secondary | ICD-10-CM | POA: Insufficient documentation

## 2012-01-12 DIAGNOSIS — K219 Gastro-esophageal reflux disease without esophagitis: Secondary | ICD-10-CM | POA: Insufficient documentation

## 2012-01-12 HISTORY — PX: TRIGGER FINGER RELEASE: SHX641

## 2012-01-12 HISTORY — DX: Dental restoration status: Z98.811

## 2012-01-12 HISTORY — DX: Trigger finger, right middle finger: M65.331

## 2012-01-12 SURGERY — RELEASE, A1 PULLEY, FOR TRIGGER FINGER
Anesthesia: Monitor Anesthesia Care | Site: Hand | Laterality: Right | Wound class: Clean

## 2012-01-12 MED ORDER — METOCLOPRAMIDE HCL 5 MG/ML IJ SOLN: 10.0000 mg | Freq: Once | INTRAMUSCULAR | Status: DC | PRN

## 2012-01-12 MED ORDER — CHLORHEXIDINE GLUCONATE 4 % EX LIQD: 60.0000 mL | Freq: Once | CUTANEOUS | Status: DC

## 2012-01-12 MED ORDER — ONDANSETRON HCL 4 MG/2ML IJ SOLN
INTRAMUSCULAR | Status: DC | PRN
  Administered 2012-01-12: 4 mg via INTRAVENOUS

## 2012-01-12 MED ORDER — FENTANYL CITRATE 0.05 MG/ML IJ SOLN
INTRAMUSCULAR | Status: DC | PRN
  Administered 2012-01-12: 50 ug via INTRAVENOUS

## 2012-01-12 MED ORDER — OXYCODONE-ACETAMINOPHEN 5-325 MG PO TABS: ORAL_TABLET | ORAL | Status: DC

## 2012-01-12 MED ORDER — PROPOFOL 10 MG/ML IV EMUL: INTRAVENOUS | Status: DC | PRN

## 2012-01-12 MED ORDER — LACTATED RINGERS IV SOLN
INTRAVENOUS | Status: DC
  Administered 2012-01-12 (×2): via INTRAVENOUS

## 2012-01-12 MED ORDER — PROPOFOL 10 MG/ML IV EMUL
INTRAVENOUS | Status: DC | PRN
  Administered 2012-01-12: 65 ug/kg/min via INTRAVENOUS

## 2012-01-12 MED ORDER — LIDOCAINE HCL (CARDIAC) 20 MG/ML IV SOLN
INTRAVENOUS | Status: DC | PRN
  Administered 2012-01-12: 10 mg via INTRAVENOUS

## 2012-01-12 MED ORDER — OXYCODONE HCL 5 MG PO TABS: 5.0000 mg | ORAL_TABLET | Freq: Once | ORAL | Status: DC | PRN

## 2012-01-12 MED ORDER — CEFAZOLIN SODIUM 1-5 GM-% IV SOLN
1.0000 g | INTRAVENOUS | Status: AC
  Administered 2012-01-12: 1 g via INTRAVENOUS

## 2012-01-12 MED ORDER — LIDOCAINE HCL (PF) 0.5 % IJ SOLN
INTRAMUSCULAR | Status: DC | PRN
  Administered 2012-01-12: 30 mL via INTRATHECAL

## 2012-01-12 MED ORDER — FENTANYL CITRATE 0.05 MG/ML IJ SOLN: 25.0000 ug | INTRAMUSCULAR | Status: DC | PRN

## 2012-01-12 MED ORDER — MIDAZOLAM HCL 5 MG/5ML IJ SOLN
INTRAMUSCULAR | Status: DC | PRN
  Administered 2012-01-12: 1 mg via INTRAVENOUS

## 2012-01-12 MED ORDER — BUPIVACAINE HCL (PF) 0.25 % IJ SOLN
INTRAMUSCULAR | Status: DC | PRN
  Administered 2012-01-12: 5 mL

## 2012-01-12 SURGICAL SUPPLY — 38 items
BANDAGE COBAN STERILE 2 (GAUZE/BANDAGES/DRESSINGS) ×2 IMPLANT
BANDAGE CONFORM 2  STR LF (GAUZE/BANDAGES/DRESSINGS) ×2 IMPLANT
BLADE MINI RND TIP GREEN BEAV (BLADE) IMPLANT
BLADE SURG 15 STRL LF DISP TIS (BLADE) ×2 IMPLANT
BLADE SURG 15 STRL SS (BLADE) ×4
BNDG CMPR 9X4 STRL LF SNTH (GAUZE/BANDAGES/DRESSINGS)
BNDG CMPR MD 5X2 ELC HKLP STRL (GAUZE/BANDAGES/DRESSINGS)
BNDG ELASTIC 2 VLCR STRL LF (GAUZE/BANDAGES/DRESSINGS) IMPLANT
BNDG ESMARK 4X9 LF (GAUZE/BANDAGES/DRESSINGS) IMPLANT
CHLORAPREP W/TINT 26ML (MISCELLANEOUS) ×2 IMPLANT
CLOTH BEACON ORANGE TIMEOUT ST (SAFETY) ×2 IMPLANT
CORDS BIPOLAR (ELECTRODE) ×2 IMPLANT
COVER MAYO STAND STRL (DRAPES) ×2 IMPLANT
COVER TABLE BACK 60X90 (DRAPES) ×2 IMPLANT
CUFF TOURNIQUET SINGLE 18IN (TOURNIQUET CUFF) ×2 IMPLANT
DRAPE EXTREMITY T 121X128X90 (DRAPE) ×2 IMPLANT
DRAPE SURG 17X23 STRL (DRAPES) ×2 IMPLANT
GAUZE XEROFORM 1X8 LF (GAUZE/BANDAGES/DRESSINGS) ×2 IMPLANT
GLOVE BIO SURGEON STRL SZ7.5 (GLOVE) ×3 IMPLANT
GLOVE BIOGEL M 7.0 STRL (GLOVE) ×1 IMPLANT
GLOVE BIOGEL PI IND STRL 7.5 (GLOVE) ×1 IMPLANT
GLOVE BIOGEL PI INDICATOR 7.5 (GLOVE) ×1
GOWN PREVENTION PLUS XLARGE (GOWN DISPOSABLE) ×2 IMPLANT
GOWN STRL REIN XL XLG (GOWN DISPOSABLE) ×2 IMPLANT
NDL HYPO 25X1 1.5 SAFETY (NEEDLE) IMPLANT
NEEDLE HYPO 25X1 1.5 SAFETY (NEEDLE) ×2 IMPLANT
NS IRRIG 1000ML POUR BTL (IV SOLUTION) ×2 IMPLANT
PACK BASIN DAY SURGERY FS (CUSTOM PROCEDURE TRAY) ×2 IMPLANT
PADDING CAST ABS 4INX4YD NS (CAST SUPPLIES) ×1
PADDING CAST ABS COTTON 4X4 ST (CAST SUPPLIES) ×1 IMPLANT
SPONGE GAUZE 4X4 12PLY (GAUZE/BANDAGES/DRESSINGS) ×2 IMPLANT
STOCKINETTE 4X48 STRL (DRAPES) ×2 IMPLANT
SUT ETHILON 4 0 PS 2 18 (SUTURE) ×2 IMPLANT
SYR BULB 3OZ (MISCELLANEOUS) ×2 IMPLANT
SYR CONTROL 10ML LL (SYRINGE) ×1 IMPLANT
TOWEL OR 17X24 6PK STRL BLUE (TOWEL DISPOSABLE) ×3 IMPLANT
UNDERPAD 30X30 INCONTINENT (UNDERPADS AND DIAPERS) ×2 IMPLANT
WATER STERILE IRR 1000ML POUR (IV SOLUTION) ×1 IMPLANT

## 2012-01-12 NOTE — Op Note (Signed)
Dictation [id number]

## 2012-01-12 NOTE — Discharge Instructions (Addendum)

## 2012-01-12 NOTE — Op Note (Signed)
NAME:  Emily Compton, Emily Compton NO.:  [id number]  MEDICAL RECORD NO.:  [id number]  LOCATION:                                 FACILITY:  PHYSICIAN:  Betha Loa, MD             DATE OF BIRTH:  DATE OF PROCEDURE:  01/12/2012 DATE OF DISCHARGE:                              OPERATIVE REPORT   PREOPERATIVE DIAGNOSIS:  Right long finger trigger digit.  POSTOP DIAGNOSIS:  Right long finger trigger digit.  PROCEDURE:  Right long finger trigger release.  SURGEON:  Betha Loa, MD.  ASSISTANT:  None.  ANESTHESIA:  Bier block.  IV FLUIDS:  Per anesthesia flow sheet.  ESTIMATED BLOOD LOSS:  Minimal.  COMPLICATIONS:  None.  SPECIMENS:  None.  TOURNIQUET TIME:  31 minutes.  DISPOSITION:  Stable to PACU.  INDICATIONS:  Ms. Mogg is a 56 year old right-hand dominant female, who has had triggering of the right long finger for many months.  She has tried injections twice with relief after the first but recurrence of the triggering.  She wished to have a right long finger trigger digit release for management of symptoms.  Risks, benefits, alternatives of surgery were discussed including the risk of blood loss, infection, damage to nerves, vessels, tendons, ligaments, bone, failure of surgery, need for additional surgery, complications with wound healing, continued pain, continued triggering.  She voiced understanding of these risks and elected to proceed.  OPERATIVE COURSE:  After being identified preoperatively by myself, the Patient and I agreed upon procedure and site of procedure.  Surgical site was marked.  The risks, benefits, alternatives of surgery were reviewed, and she wished to proceed.  Surgical consent was signed.  She was given 1 g of IV Ancef as preoperative antibiotic prophylaxis.  She was transferred to the operative room, placed on the operating table in supine position with the right upper extremity on arm board.  Bier block anesthesia was induced by  the anesthesiologist.  Right upper extremity was prepped and draped in the normal sterile orthopedic fashion.  A surgical pause was performed between surgeons, anesthesia, operating staff, and all were in agreement with the patient, procedure, and site of procedure.  Incision was made between the proximal and distal palmar flexion creases.  This was carried into subcutaneous tissues by spreading technique.  The flexor tendons  were easily identified.  The Radial and ulnar neurovascular bundles were protected throughout the case.  The A1 pulley was identified and incised sharply.  The proximal aspect of the A2 pulley was incised for a couple of millimeters to aid in tendon gliding.  The tendons were expressed through the wound, and there was no triggering.  The patient was awoken enough to flex her hand down into a tight fist.  She was able to make a complete fist.  She held this tightly and then slowly opened her fingers, and there was no triggering. She was able to fully extend her fingers.  The wound was copiously irrigated.  It was closed with 4-0 nylon in a horizontal mattress fashion.  She was injected with 5 mL of 0.25% plain Marcaine to aid in postoperative analgesia.  It was then dressed with  sterile Xeroform, 4x4s, and wrapped with a Coban dressing lightly.  Tourniquet was deflated at 31 minutes.  The fingertips were pink with brisk capillary refill after deflation of tourniquet.  Operative drapes were broken down.  The patient was awoken from anesthesia safely.  She was transferred back to stretcher and taken to PACU in stable condition.  I will see her back in the office in 1 week for postoperative followup.  I will give Percocet 5/325 one to two p.o. q.6 hours p.r.n. pain, dispensed #30.     Betha Loa, MD     KK/MEDQ  D:  01/12/2012  T:  01/12/2012  Job:  [id number]

## 2012-01-12 NOTE — Anesthesia Preprocedure Evaluation (Signed)
Anesthesia Evaluation  Patient identified by MRN, date of birth, ID band Patient awake    Reviewed: Allergy & Precautions, H&P , NPO status , Patient's Chart, lab work & pertinent test results, reviewed documented beta blocker date and time   Airway Mallampati: II TM Distance: >3 FB Neck ROM: full    Dental   Pulmonary neg pulmonary ROS,          Cardiovascular negative cardio ROS      Neuro/Psych  Neuromuscular disease negative psych ROS   GI/Hepatic Neg liver ROS, GERD-  Medicated and Controlled,  Endo/Other  negative endocrine ROS  Renal/GU negative Renal ROS  negative genitourinary   Musculoskeletal   Abdominal   Peds  Hematology negative hematology ROS (+)   Anesthesia Other Findings See surgeon's H&P   Reproductive/Obstetrics negative OB ROS                           Anesthesia Physical Anesthesia Plan  ASA: I  Anesthesia Plan: MAC and Bier Block   Post-op Pain Management:    Induction: Intravenous  Airway Management Planned: Simple Face Mask  Additional Equipment:   Intra-op Plan:   Post-operative Plan:   Informed Consent: I have reviewed the patients History and Physical, chart, labs and discussed the procedure including the risks, benefits and alternatives for the proposed anesthesia with the patient or authorized representative who has indicated his/her understanding and acceptance.   Dental Advisory Given  Plan Discussed with: CRNA and Surgeon  Anesthesia Plan Comments:         Anesthesia Quick Evaluation

## 2012-01-12 NOTE — Transfer of Care (Signed)
Immediate Anesthesia Transfer of Care Note  Patient: Emily Compton  Procedure(s) Performed: Procedure(s) (LRB): RELEASE TRIGGER FINGER/A-1 PULLEY (Right)  Patient Location: PACU  Anesthesia Type: Bier block  Level of Consciousness: awake, alert , oriented and patient cooperative  Airway & Oxygen Therapy: Patient Spontanous Breathing and Patient connected to face mask oxygen  Post-op Assessment: Report given to PACU RN and Post -op Vital signs reviewed and stable  Post vital signs: Reviewed and stable  Complications: No apparent anesthesia complications

## 2012-01-12 NOTE — Anesthesia Postprocedure Evaluation (Signed)
Anesthesia Post Note  Patient: Emily Compton  Procedure(s) Performed: Procedure(s) (LRB): RELEASE TRIGGER FINGER/A-1 PULLEY (Right)  Anesthesia type: MAC  Patient location: PACU  Post pain: Pain level controlled  Post assessment: Patient's Cardiovascular Status Stable  Last Vitals:  Filed Vitals:   01/12/12 1100  BP: 107/58  Pulse: 60  Temp: 36.7 C  Resp: 14    Post vital signs: Reviewed and stable  Level of consciousness: alert  Complications: No apparent anesthesia complications

## 2012-01-12 NOTE — H&P (Signed)
  Emily Compton is an 56 y.o. female.   Chief Complaint: right long trigger digit HPI: 56 yo rhd female with triggering of right long finger.  Very bothersome to her.  Has had injections x 2 with relief from first, but recurrence of triggering.  She would like a trigger release of the right long finger.  Past Medical History  Diagnosis Date  . Dental crowns present     and caps  . Trigger middle finger of right hand 12/2011  . GERD (gastroesophageal reflux disease)     prn OTC    Past Surgical History  Procedure Date  . Tubal ligation   . Tonsillectomy age 45  . Lumbar fusion 03/02/2011    right transforaminal lumbar fusion L4-5; left posterolateral fusion L4-5    Family History  Problem Relation Age of Onset  . Breast cancer Mother   . Uterine cancer Mother   . Colon cancer Neg Hx   . Brain cancer Mother    Social History:  reports that she has never smoked. She has never used smokeless tobacco. She reports that she drinks alcohol. She reports that she does not use illicit drugs.  Allergies:  Allergies  Allergen Reactions  . Adhesive (Tape) Rash    Medications Prior to Admission  Medication Sig Dispense Refill  . Multiple Vitamin (MULTIVITAMIN) tablet Take 1 tablet by mouth daily.      Marland Kitchen omeprazole (PRILOSEC OTC) 20 MG tablet Take 20 mg by mouth daily. Take one tablet as needed for acid reflux         Results for orders placed during the hospital encounter of 01/12/12 (from the past 48 hour(s))  POCT HEMOGLOBIN-HEMACUE     Status: Normal   Collection Time   01/12/12  8:12 AM      Component Value Range Comment   Hemoglobin 13.7  12.0 - 15.0 g/dL     No results found.   A comprehensive review of systems was negative except for: Eyes: positive for contacts/glasses  Blood pressure 105/76, pulse 73, temperature 97.8 F (36.6 C), temperature source Oral, resp. rate 16, height 5\' 1"  (1.549 m), weight 56.246 kg (124 lb), SpO2 97.00%.  General appearance: alert,  cooperative and appears stated age Head: Normocephalic, without obvious abnormality, atraumatic Neck: supple, symmetrical, trachea midline Resp: clear to auscultation bilaterally Cardio: regular rate and rhythm GI: soft, non-tender; bowel sounds normal; no masses,  no organomegaly Extremities: light touch sensation and capillary refill intact all digits.  +epl/fpl/io.  ttp volar aspect right long finger. demonstrable triggering. Pulses: 2+ and symmetric Skin: Skin color, texture, turgor normal. No rashes or lesions Neurologic: Grossly normal Incision/Wound: na  Assessment/Plan Right long finger trigger digit.  Discussed non operative and operative treatment options with patient.  She would like a trigger release of right long finger.  Risks, benefits, and alternatives of surgery were discussed and the patient agrees with the plan of care.   Kaylen Motl R 01/12/2012, 8:50 AM

## 2012-01-13 ENCOUNTER — Encounter (HOSPITAL_BASED_OUTPATIENT_CLINIC_OR_DEPARTMENT_OTHER): Payer: Self-pay | Admitting: Orthopedic Surgery

## 2012-08-22 ENCOUNTER — Encounter: Payer: Self-pay | Admitting: Gastroenterology

## 2012-08-22 ENCOUNTER — Ambulatory Visit (INDEPENDENT_AMBULATORY_CARE_PROVIDER_SITE_OTHER): Payer: Self-pay | Admitting: Gastroenterology

## 2012-08-22 ENCOUNTER — Telehealth: Payer: Self-pay | Admitting: Gastroenterology

## 2012-08-22 VITALS — BP 90/64 | HR 76 | Ht 60.0 in | Wt 133.1 lb

## 2012-08-22 DIAGNOSIS — K625 Hemorrhage of anus and rectum: Secondary | ICD-10-CM

## 2012-08-22 MED ORDER — DIAZEPAM 10 MG PO TABS: 10.0000 mg | ORAL_TABLET | ORAL | Status: DC | PRN

## 2012-08-22 NOTE — Telephone Encounter (Signed)
Pt has been having increasing problems with her hemorrhoids. She requested to be seen to discuss her options for treatment and possible banding. Pt scheduled to see Dr. Arlyce Dice today at 2:30pm. Pt aware of appt date and time.

## 2012-08-22 NOTE — Progress Notes (Signed)
History of Present Illness:  Pleasant 57 year old white female referred at the request of Dr. Yetta Barre for evaluation of rectal bleeding. She was seen for similar complaints in October, 2012 where is was determined that she was bleeding from internal hemorrhoids. Colonoscopy confirmed this. Despite various medications including topicals and suppositories she has continued to experience frequent rectal bleeding consisting of bright red blood with a bowel movement. She occasionally has rectal discomfort.  Colonoscopy in October, 2012 demonstrated diverticulosis and internal hemorrhoids.    Review of Systems: Pertinent positive and negative review of systems were noted in the above HPI section. All other review of systems were otherwise negative.    Current Medications, Allergies, Past Medical History, Past Surgical History, Family History and Social History were reviewed in [employer] electronic medical record  Vital signs were reviewed in today's medical record. Physical Exam: General: Well developed , well nourished, no acute distress Visual inspection the rectum shows no abnormalities

## 2012-08-22 NOTE — Patient Instructions (Addendum)
You have been scheduled for a flexible sigmoidoscopy. Please follow the written instructions given to you at your visit today. If you use inhalers (even only as needed), please bring them with you on the day of your procedure. CC:  Sanda Linger MD

## 2012-08-22 NOTE — Assessment & Plan Note (Addendum)
Patient continues to have symptomatic internal hemorrhoids characterized by frequent heavy rectal bleeding despite medical therapy. Therapeutic options were discussed with the patient including hemorrhoidal banding and surgical hemorrhoidectomy. She has decided to proceed with banding as a first step  Risks, alternatives, and complications of the procedure, including bleeding, perforation, and possible need for surgery, were explained to the patient.  Patient's questions were answered.

## 2012-08-29 ENCOUNTER — Ambulatory Visit (HOSPITAL_COMMUNITY)
Admission: RE | Admit: 2012-08-29 | Discharge: 2012-08-29 | Disposition: A | Discharge status: Home / Self Care | Payer: Self-pay | Source: Ambulatory Visit | Attending: Gastroenterology | Admitting: Gastroenterology

## 2012-08-29 ENCOUNTER — Encounter (HOSPITAL_COMMUNITY): Payer: Self-pay

## 2012-08-29 ENCOUNTER — Encounter (HOSPITAL_COMMUNITY)
Admission: RE | Disposition: A | Discharge status: Home / Self Care | Payer: Self-pay | Source: Ambulatory Visit | Attending: Gastroenterology

## 2012-08-29 DIAGNOSIS — K625 Hemorrhage of anus and rectum: Secondary | ICD-10-CM

## 2012-08-29 DIAGNOSIS — K648 Other hemorrhoids: Secondary | ICD-10-CM

## 2012-08-29 HISTORY — PX: FLEXIBLE SIGMOIDOSCOPY: SHX5431

## 2012-08-29 HISTORY — PX: HEMORRHOID BANDING: SHX5850

## 2012-08-29 SURGERY — LIGATION, HEMORRHOID
Anesthesia: Moderate Sedation

## 2012-08-29 MED ORDER — FENTANYL CITRATE 0.05 MG/ML IJ SOLN
INTRAMUSCULAR | Status: AC
  Filled 2012-08-29: qty 2

## 2012-08-29 MED ORDER — DIPHENHYDRAMINE HCL 50 MG/ML IJ SOLN
INTRAMUSCULAR | Status: DC | PRN
  Administered 2012-08-29 (×2): 25 mg via INTRAVENOUS

## 2012-08-29 MED ORDER — FENTANYL CITRATE 0.05 MG/ML IJ SOLN
INTRAMUSCULAR | Status: DC | PRN
  Administered 2012-08-29 (×3): 25 ug via INTRAVENOUS

## 2012-08-29 MED ORDER — SODIUM CHLORIDE 0.9 % IV SOLN
INTRAVENOUS | Status: DC
  Administered 2012-08-29: 20 mL/h via INTRAVENOUS

## 2012-08-29 MED ORDER — DIPHENHYDRAMINE HCL 50 MG/ML IJ SOLN
INTRAMUSCULAR | Status: AC
  Filled 2012-08-29: qty 1

## 2012-08-29 MED ORDER — MIDAZOLAM HCL 10 MG/2ML IJ SOLN
INTRAMUSCULAR | Status: DC | PRN
  Administered 2012-08-29 (×4): 2 mg via INTRAVENOUS

## 2012-08-29 MED ORDER — MIDAZOLAM HCL 10 MG/2ML IJ SOLN
INTRAMUSCULAR | Status: AC
  Filled 2012-08-29: qty 2

## 2012-08-29 NOTE — Interval H&P Note (Signed)
History and Physical Interval Note:  08/29/2012 12:44 PM  Emily Compton  has presented today for surgery, with the diagnosis of Hemorrhoids [455.6]  The various methods of treatment have been discussed with the patient and family. After consideration of risks, benefits and other options for treatment, the patient has consented to  Procedure(s) (LRB) with comments: HEMORRHOID BANDING (N/A) FLEXIBLE SIGMOIDOSCOPY (N/A) as a surgical intervention .  The patient's history has been reviewed, patient examined, no change in status, stable for surgery.  I have reviewed the patient's chart and labs.  Questions were answered to the patient's satisfaction.    The recent H&P (dated *08/22/12**) was reviewed, the patient was examined and there is no change in the patients condition since that H&P was completed.   [name]  08/29/2012, 12:44 PM    [name]

## 2012-08-29 NOTE — H&P (View-Only) (Signed)
History of Present Illness:  Pleasant 57-year-old white female referred at the request of Dr. Jones for evaluation of rectal bleeding. She was seen for similar complaints in October, 2012 where is was determined that she was bleeding from internal hemorrhoids. Colonoscopy confirmed this. Despite various medications including topicals and suppositories she has continued to experience frequent rectal bleeding consisting of bright red blood with a bowel movement. She occasionally has rectal discomfort.  Colonoscopy in October, 2012 demonstrated diverticulosis and internal hemorrhoids.    Review of Systems: Pertinent positive and negative review of systems were noted in the above HPI section. All other review of systems were otherwise negative.    Current Medications, Allergies, Past Medical History, Past Surgical History, Family History and Social History were reviewed in [facility] electronic medical record  Vital signs were reviewed in today's medical record. Physical Exam: General: Well developed , well nourished, no acute distress Visual inspection the rectum shows no abnormalities    

## 2012-08-29 NOTE — Op Note (Signed)
[facility] [address] [place], [address]   FLEXIBLE SIGMOIDOSCOPY PROCEDURE REPORT  PATIENT: Emily Compton, Emily Compton  MR#: [id number] BIRTHDATE: 1955/11/16 , 56  yrs. old GENDER: Female ENDOSCOPIST: Louis Meckel, MD REFERRED BY: Etta Grandchild, M.D. PROCEDURE DATE:  08/29/2012 PROCEDURE:   Hemorrhoidectomy via banding, clips or ligation ASA CLASS:   Class II INDICATIONS:rectal bleeding.   therapy of for previously diagnosed hemorrhoids. MEDICATIONS: These medications were titrated to patient response per physician's verbal order, Versed 8 mg IV, Fentanyl 75 mcg IV, and Benadryl 50 mg IV  DESCRIPTION OF PROCEDURE:   After the risks benefits and alternatives of the procedure were thoroughly explained, informed consent was obtained.  revealed no abnormalities of the rectum. The endoscope was introduced through the anus  and advanced to the sigmoid colon , limited by No adverse events experienced.   The quality of the prep was    .  The instrument was then slowly withdrawn as the mucosa was fully examined.         COLON FINDINGS: Internal hemorrhoids were found.  2 large internal hemorrhoid bundles were identified. Hemorrhoids were friable. 3 bands were placed over the 2 bundles just above the dentate line The scope was then withdrawn from the patient and the procedure terminated.  COMPLICATIONS: There were no complications.  ENDOSCOPIC IMPRESSION: Bleeding internal hemorrhoids-status post band ligation  RECOMMENDATIONS: Office visit one month  REPEAT EXAM:   _______________________________ eSignedLouis Meckel, MD 08/29/2012 1:01 PM   CC:

## 2012-08-30 ENCOUNTER — Encounter (HOSPITAL_COMMUNITY): Payer: Self-pay | Admitting: Gastroenterology

## 2012-09-15 ENCOUNTER — Other Ambulatory Visit: Payer: Self-pay

## 2012-10-09 LAB — HM MAMMOGRAPHY: HM Mammogram: NORMAL

## 2012-10-29 ENCOUNTER — Encounter: Payer: Self-pay | Admitting: Gastroenterology

## 2012-10-29 ENCOUNTER — Ambulatory Visit (INDEPENDENT_AMBULATORY_CARE_PROVIDER_SITE_OTHER): Payer: BC Managed Care – PPO | Admitting: Gastroenterology

## 2012-10-29 VITALS — BP 108/62 | HR 60 | Ht 60.0 in | Wt 127.8 lb

## 2012-10-29 DIAGNOSIS — K648 Other hemorrhoids: Secondary | ICD-10-CM

## 2012-10-29 DIAGNOSIS — K625 Hemorrhage of anus and rectum: Secondary | ICD-10-CM

## 2012-10-29 NOTE — Patient Instructions (Addendum)
Follow up as needed

## 2012-10-29 NOTE — Assessment & Plan Note (Signed)
Status post band ligation with excellent results.

## 2012-10-29 NOTE — Progress Notes (Signed)
  History of Present Illness:  Emily Compton has returned following band ligation of internal hemorrhoids. She was having severe bleeding prior to the procedure. At this point she has occasional spotting on the tissue only. She is without pain.    Review of Systems: Pertinent positive and negative review of systems were noted in the above HPI section. All other review of systems were otherwise negative.    Current Medications, Allergies, Past Medical History, Past Surgical History, Family History and Social History were reviewed in [employer] electronic medical record  Vital signs were reviewed in today's medical record. Physical Exam: General: Well developed , well nourished, no acute distress

## 2013-02-25 ENCOUNTER — Encounter: Payer: Self-pay | Admitting: Internal Medicine

## 2013-02-25 ENCOUNTER — Ambulatory Visit (INDEPENDENT_AMBULATORY_CARE_PROVIDER_SITE_OTHER): Payer: BC Managed Care – PPO

## 2013-02-25 ENCOUNTER — Ambulatory Visit (INDEPENDENT_AMBULATORY_CARE_PROVIDER_SITE_OTHER): Payer: BC Managed Care – PPO | Admitting: Internal Medicine

## 2013-02-25 VITALS — BP 96/60 | HR 58 | Temp 97.7°F | Resp 16 | Wt 134.8 lb

## 2013-02-25 DIAGNOSIS — M549 Dorsalgia, unspecified: Secondary | ICD-10-CM

## 2013-02-25 DIAGNOSIS — Z0000 Encounter for general adult medical examination without abnormal findings: Secondary | ICD-10-CM | POA: Insufficient documentation

## 2013-02-25 DIAGNOSIS — K648 Other hemorrhoids: Secondary | ICD-10-CM

## 2013-02-25 DIAGNOSIS — K219 Gastro-esophageal reflux disease without esophagitis: Secondary | ICD-10-CM

## 2013-02-25 LAB — LIPID PANEL
HDL: 90.6 mg/dL (ref >39.00)
Total CHOL/HDL Ratio: 3
VLDL: 12 mg/dL (ref 0.0–40.0)

## 2013-02-25 LAB — CBC WITH DIFFERENTIAL/PLATELET
Basophils Relative: 0.6 % (ref 0.0–3.0)
Eosinophils Absolute: 0.2 10*3/uL (ref 0.0–0.7)
MCHC: 33.9 g/dL (ref 30.0–36.0)
MCV: 86.4 fl (ref 78.0–100.0)
Monocytes Absolute: 0.5 10*3/uL (ref 0.1–1.0)
Neutrophils Relative %: 48.4 % (ref 43.0–77.0)
Platelets: 244 10*3/uL (ref 150.0–400.0)

## 2013-02-25 LAB — COMPREHENSIVE METABOLIC PANEL
AST: 22 U/L (ref 0–37)
Albumin: 4.2 g/dL (ref 3.5–5.2)
Alkaline Phosphatase: 57 U/L (ref 39–117)
Potassium: 4 mEq/L (ref 3.5–5.1)
Sodium: 139 mEq/L (ref 135–145)
Total Protein: 7.3 g/dL (ref 6.0–8.3)

## 2013-02-25 LAB — HEPATITIS C ANTIBODY: HCV Ab: NEGATIVE

## 2013-02-25 MED ORDER — DIAZEPAM 10 MG PO TABS: 10.0000 mg | ORAL_TABLET | Freq: Two times a day (BID) | ORAL | Status: DC | PRN

## 2013-02-25 MED ORDER — OXYCODONE-ACETAMINOPHEN 5-325 MG PO TABS: ORAL_TABLET | ORAL | Status: DC

## 2013-02-25 NOTE — Patient Instructions (Signed)
Preventive Care for Adults, Female A healthy lifestyle and preventive care can promote health and wellness. Preventive health guidelines for women include the following key practices.  A routine yearly physical is a good way to check with your caregiver about your health and preventive screening. It is a chance to share any concerns and updates on your health, and to receive a thorough exam.  Visit your dentist for a routine exam and preventive care every 6 months. Brush your teeth twice a day and floss once a day. Good oral hygiene prevents tooth decay and gum disease.  The frequency of eye exams is based on your age, health, family medical history, use of contact lenses, and other factors. Follow your caregiver's recommendations for frequency of eye exams.  Eat a healthy diet. Foods like vegetables, fruits, whole grains, low-fat dairy products, and lean protein foods contain the nutrients you need without too many calories. Decrease your intake of foods high in solid fats, added sugars, and salt. Eat the right amount of calories for you.Get information about a proper diet from your caregiver, if necessary.  Regular physical exercise is one of the most important things you can do for your health. Most adults should get at least 150 minutes of moderate-intensity exercise (any activity that increases your heart rate and causes you to sweat) each week. In addition, most adults need muscle-strengthening exercises on 2 or more days a week.  Maintain a healthy weight. The body mass index (BMI) is a screening tool to identify possible weight problems. It provides an estimate of body fat based on height and weight. Your caregiver can help determine your BMI, and can help you achieve or maintain a healthy weight.For adults 20 years and older:  A BMI below 18.5 is considered underweight.  A BMI of 18.5 to 24.9 is normal.  A BMI of 25 to 29.9 is considered overweight.  A BMI of 30 and above is  considered obese.  Maintain normal blood lipids and cholesterol levels by exercising and minimizing your intake of saturated fat. Eat a balanced diet with plenty of fruit and vegetables. Blood tests for lipids and cholesterol should begin at age 20 and be repeated every 5 years. If your lipid or cholesterol levels are high, you are over 50, or you are at high risk for heart disease, you may need your cholesterol levels checked more frequently.Ongoing high lipid and cholesterol levels should be treated with medicines if diet and exercise are not effective.  If you smoke, find out from your caregiver how to quit. If you do not use tobacco, do not start.  If you are pregnant, do not drink alcohol. If you are breastfeeding, be very cautious about drinking alcohol. If you are not pregnant and choose to drink alcohol, do not exceed 1 drink per day. One drink is considered to be 12 ounces (355 mL) of beer, 5 ounces (148 mL) of wine, or 1.5 ounces (44 mL) of liquor.  Avoid use of street drugs. Do not share needles with anyone. Ask for help if you need support or instructions about stopping the use of drugs.  High blood pressure causes heart disease and increases the risk of stroke. Your blood pressure should be checked at least every 1 to 2 years. Ongoing high blood pressure should be treated with medicines if weight loss and exercise are not effective.  If you are 55 to 59 years old, ask your caregiver if you should take aspirin to prevent strokes.  Diabetes   screening involves taking a blood sample to check your fasting blood sugar level. This should be done once every 3 years, after age 45, if you are within normal weight and without risk factors for diabetes. Testing should be considered at a younger age or be carried out more frequently if you are overweight and have at least 1 risk factor for diabetes.  Breast cancer screening is essential preventive care for women. You should practice "breast  self-awareness." This means understanding the normal appearance and feel of your breasts and may include breast self-examination. Any changes detected, no matter how small, should be reported to a caregiver. Women in their 20s and 30s should have a clinical breast exam (CBE) by a caregiver as part of a regular health exam every 1 to 3 years. After age 40, women should have a CBE every year. Starting at age 40, women should consider having a mammography (breast X-ray test) every year. Women who have a family history of breast cancer should talk to their caregiver about genetic screening. Women at a high risk of breast cancer should talk to their caregivers about having magnetic resonance imaging (MRI) and a mammography every year.  The Pap test is a screening test for cervical cancer. A Pap test can show cell changes on the cervix that might become cervical cancer if left untreated. A Pap test is a procedure in which cells are obtained and examined from the lower end of the uterus (cervix).  Women should have a Pap test starting at age 21.  Between ages 21 and 29, Pap tests should be repeated every 2 years.  Beginning at age 30, you should have a Pap test every 3 years as long as the past 3 Pap tests have been normal.  Some women have medical problems that increase the chance of getting cervical cancer. Talk to your caregiver about these problems. It is especially important to talk to your caregiver if a new problem develops soon after your last Pap test. In these cases, your caregiver may recommend more frequent screening and Pap tests.  The above recommendations are the same for women who have or have not gotten the vaccine for human papillomavirus (HPV).  If you had a hysterectomy for a problem that was not cancer or a condition that could lead to cancer, then you no longer need Pap tests. Even if you no longer need a Pap test, a regular exam is a good idea to make sure no other problems are  starting.  If you are between ages 65 and 70, and you have had normal Pap tests going back 10 years, you no longer need Pap tests. Even if you no longer need a Pap test, a regular exam is a good idea to make sure no other problems are starting.  If you have had past treatment for cervical cancer or a condition that could lead to cancer, you need Pap tests and screening for cancer for at least 20 years after your treatment.  If Pap tests have been discontinued, risk factors (such as a new sexual partner) need to be reassessed to determine if screening should be resumed.  The HPV test is an additional test that may be used for cervical cancer screening. The HPV test looks for the virus that can cause the cell changes on the cervix. The cells collected during the Pap test can be tested for HPV. The HPV test could be used to screen women aged 30 years and older, and should   be used in women of any age who have unclear Pap test results. After the age of 30, women should have HPV testing at the same frequency as a Pap test.  Colorectal cancer can be detected and often prevented. Most routine colorectal cancer screening begins at the age of 50 and continues through age 75. However, your caregiver may recommend screening at an earlier age if you have risk factors for colon cancer. On a yearly basis, your caregiver may provide home test kits to check for hidden blood in the stool. Use of a small camera at the end of a tube, to directly examine the colon (sigmoidoscopy or colonoscopy), can detect the earliest forms of colorectal cancer. Talk to your caregiver about this at age 50, when routine screening begins. Direct examination of the colon should be repeated every 5 to 10 years through age 75, unless early forms of pre-cancerous polyps or small growths are found.  Hepatitis C blood testing is recommended for all people born from 1945 through 1965 and any individual with known risks for hepatitis C.  Practice  safe sex. Use condoms and avoid high-risk sexual practices to reduce the spread of sexually transmitted infections (STIs). STIs include gonorrhea, chlamydia, syphilis, trichomonas, herpes, HPV, and human immunodeficiency virus (HIV). Herpes, HIV, and HPV are viral illnesses that have no cure. They can result in disability, cancer, and death. Sexually active women aged 25 and younger should be checked for chlamydia. Older women with new or multiple partners should also be tested for chlamydia. Testing for other STIs is recommended if you are sexually active and at increased risk.  Osteoporosis is a disease in which the bones lose minerals and strength with aging. This can result in serious bone fractures. The risk of osteoporosis can be identified using a bone density scan. Women ages 65 and over and women at risk for fractures or osteoporosis should discuss screening with their caregivers. Ask your caregiver whether you should take a calcium supplement or vitamin D to reduce the rate of osteoporosis.  Menopause can be associated with physical symptoms and risks. Hormone replacement therapy is available to decrease symptoms and risks. You should talk to your caregiver about whether hormone replacement therapy is right for you.  Use sunscreen with sun protection factor (SPF) of 30 or more. Apply sunscreen liberally and repeatedly throughout the day. You should seek shade when your shadow is shorter than you. Protect yourself by wearing long sleeves, pants, a wide-brimmed hat, and sunglasses year round, whenever you are outdoors.  Once a month, do a whole body skin exam, using a mirror to look at the skin on your back. Notify your caregiver of new moles, moles that have irregular borders, moles that are larger than a pencil eraser, or moles that have changed in shape or color.  Stay current with required immunizations.  Influenza. You need a dose every fall (or winter). The composition of the flu vaccine  changes each year, so being vaccinated once is not enough.  Pneumococcal polysaccharide. You need 1 to 2 doses if you smoke cigarettes or if you have certain chronic medical conditions. You need 1 dose at age 65 (or older) if you have never been vaccinated.  Tetanus, diphtheria, pertussis (Tdap, Td). Get 1 dose of Tdap vaccine if you are younger than age 65, are over 65 and have contact with an infant, are a healthcare worker, are pregnant, or simply want to be protected from whooping cough. After that, you need a Td   booster dose every 10 years. Consult your caregiver if you have not had at least 3 tetanus and diphtheria-containing shots sometime in your life or have a deep or dirty wound.  HPV. You need this vaccine if you are a woman age 26 or younger. The vaccine is given in 3 doses over 6 months.  Measles, mumps, rubella (MMR). You need at least 1 dose of MMR if you were born in 1957 or later. You may also need a second dose.  Meningococcal. If you are age 19 to 21 and a first-year college student living in a residence hall, or have one of several medical conditions, you need to get vaccinated against meningococcal disease. You may also need additional booster doses.  Zoster (shingles). If you are age 60 or older, you should get this vaccine.  Varicella (chickenpox). If you have never had chickenpox or you were vaccinated but received only 1 dose, talk to your caregiver to find out if you need this vaccine.  Hepatitis A. You need this vaccine if you have a specific risk factor for hepatitis A virus infection or you simply wish to be protected from this disease. The vaccine is usually given as 2 doses, 6 to 18 months apart.  Hepatitis B. You need this vaccine if you have a specific risk factor for hepatitis B virus infection or you simply wish to be protected from this disease. The vaccine is given in 3 doses, usually over 6 months. Preventive Services / Frequency Ages 19 to 39  Blood  pressure check.** / Every 1 to 2 years.  Lipid and cholesterol check.** / Every 5 years beginning at age 20.  Clinical breast exam.** / Every 3 years for women in their 20s and 30s.  Pap test.** / Every 2 years from ages 21 through 29. Every 3 years starting at age 30 through age 65 or 70 with a history of 3 consecutive normal Pap tests.  HPV screening.** / Every 3 years from ages 30 through ages 65 to 70 with a history of 3 consecutive normal Pap tests.  Hepatitis C blood test.** / For any individual with known risks for hepatitis C.  Skin self-exam. / Monthly.  Influenza immunization.** / Every year.  Pneumococcal polysaccharide immunization.** / 1 to 2 doses if you smoke cigarettes or if you have certain chronic medical conditions.  Tetanus, diphtheria, pertussis (Tdap, Td) immunization. / A one-time dose of Tdap vaccine. After that, you need a Td booster dose every 10 years.  HPV immunization. / 3 doses over 6 months, if you are 26 and younger.  Measles, mumps, rubella (MMR) immunization. / You need at least 1 dose of MMR if you were born in 1957 or later. You may also need a second dose.  Meningococcal immunization. / 1 dose if you are age 19 to 21 and a first-year college student living in a residence hall, or have one of several medical conditions, you need to get vaccinated against meningococcal disease. You may also need additional booster doses.  Varicella immunization.** / Consult your caregiver.  Hepatitis A immunization.** / Consult your caregiver. 2 doses, 6 to 18 months apart.  Hepatitis B immunization.** / Consult your caregiver. 3 doses usually over 6 months. Ages 40 to 64  Blood pressure check.** / Every 1 to 2 years.  Lipid and cholesterol check.** / Every 5 years beginning at age 20.  Clinical breast exam.** / Every year after age 40.  Mammogram.** / Every year beginning at age 40   and continuing for as long as you are in good health. Consult with your  caregiver.  Pap test.** / Every 3 years starting at age 30 through age 65 or 70 with a history of 3 consecutive normal Pap tests.  HPV screening.** / Every 3 years from ages 30 through ages 65 to 70 with a history of 3 consecutive normal Pap tests.  Fecal occult blood test (FOBT) of stool. / Every year beginning at age 50 and continuing until age 75. You may not need to do this test if you get a colonoscopy every 10 years.  Flexible sigmoidoscopy or colonoscopy.** / Every 5 years for a flexible sigmoidoscopy or every 10 years for a colonoscopy beginning at age 50 and continuing until age 75.  Hepatitis C blood test.** / For all people born from 1945 through 1965 and any individual with known risks for hepatitis C.  Skin self-exam. / Monthly.  Influenza immunization.** / Every year.  Pneumococcal polysaccharide immunization.** / 1 to 2 doses if you smoke cigarettes or if you have certain chronic medical conditions.  Tetanus, diphtheria, pertussis (Tdap, Td) immunization.** / A one-time dose of Tdap vaccine. After that, you need a Td booster dose every 10 years.  Measles, mumps, rubella (MMR) immunization. / You need at least 1 dose of MMR if you were born in 1957 or later. You may also need a second dose.  Varicella immunization.** / Consult your caregiver.  Meningococcal immunization.** / Consult your caregiver.  Hepatitis A immunization.** / Consult your caregiver. 2 doses, 6 to 18 months apart.  Hepatitis B immunization.** / Consult your caregiver. 3 doses, usually over 6 months. Ages 65 and over  Blood pressure check.** / Every 1 to 2 years.  Lipid and cholesterol check.** / Every 5 years beginning at age 20.  Clinical breast exam.** / Every year after age 40.  Mammogram.** / Every year beginning at age 40 and continuing for as long as you are in good health. Consult with your caregiver.  Pap test.** / Every 3 years starting at age 30 through age 65 or 70 with a 3  consecutive normal Pap tests. Testing can be stopped between 65 and 70 with 3 consecutive normal Pap tests and no abnormal Pap or HPV tests in the past 10 years.  HPV screening.** / Every 3 years from ages 30 through ages 65 or 70 with a history of 3 consecutive normal Pap tests. Testing can be stopped between 65 and 70 with 3 consecutive normal Pap tests and no abnormal Pap or HPV tests in the past 10 years.  Fecal occult blood test (FOBT) of stool. / Every year beginning at age 50 and continuing until age 75. You may not need to do this test if you get a colonoscopy every 10 years.  Flexible sigmoidoscopy or colonoscopy.** / Every 5 years for a flexible sigmoidoscopy or every 10 years for a colonoscopy beginning at age 50 and continuing until age 75.  Hepatitis C blood test.** / For all people born from 1945 through 1965 and any individual with known risks for hepatitis C.  Osteoporosis screening.** / A one-time screening for women ages 65 and over and women at risk for fractures or osteoporosis.  Skin self-exam. / Monthly.  Influenza immunization.** / Every year.  Pneumococcal polysaccharide immunization.** / 1 dose at age 65 (or older) if you have never been vaccinated.  Tetanus, diphtheria, pertussis (Tdap, Td) immunization. / A one-time dose of Tdap vaccine if you are over   65 and have contact with an infant, are a Research scientist (physical sciences), or simply want to be protected from whooping cough. After that, you need a Td booster dose every 10 years.  Varicella immunization.** / Consult your caregiver.  Meningococcal immunization.** / Consult your caregiver.  Hepatitis A immunization.** / Consult your caregiver. 2 doses, 6 to 18 months apart.  Hepatitis B immunization.** / Check with your caregiver. 3 doses, usually over 6 months. ** Family history and personal history of risk and conditions may change your caregiver's recommendations. Document Released: 09/13/2001 Document Revised: 10/10/2011  Document Reviewed: 12/13/2010 [facility] Patient Information 2014 [place], [place]. Back Pain, Adult Low back pain is very common. About 1 in 5 people have back pain.The cause of low back pain is rarely dangerous. The pain often gets better over time.About half of people with a sudden onset of back pain feel better in just 2 weeks. About 8 in 10 people feel better by 6 weeks.  CAUSES Some common causes of back pain include:  Strain of the muscles or ligaments supporting the spine.  Wear and tear (degeneration) of the spinal discs.  Arthritis.  Direct injury to the back. DIAGNOSIS Most of the time, the direct cause of low back pain is not known.However, back pain can be treated effectively even when the exact cause of the pain is unknown.Answering your caregiver's questions about your overall health and symptoms is one of the most accurate ways to make sure the cause of your pain is not dangerous. If your caregiver needs more information, he or she may order lab work or imaging tests (X-rays or MRIs).However, even if imaging tests show changes in your back, this usually does not require surgery. HOME CARE INSTRUCTIONS For many people, back pain returns.Since low back pain is rarely dangerous, it is often a condition that people can learn to [facility] their own.   Remain active. It is stressful on the back to sit or stand in one place. Do not sit, drive, or stand in one place for more than 30 minutes at a time. Take short walks on level surfaces as soon as pain allows.Try to increase the length of time you walk each day.  Do not stay in bed.Resting more than 1 or 2 days can delay your recovery.  Do not avoid exercise or work.Your body is made to move.It is not dangerous to be active, even though your back may hurt.Your back will likely heal faster if you return to being active before your pain is gone.  Pay attention to your body when you bend and lift. Many people have less  discomfortwhen lifting if they bend their knees, keep the load close to their bodies,and avoid twisting. Often, the most comfortable positions are those that put less stress on your recovering back.  Find a comfortable position to sleep. Use a firm mattress and lie on your side with your knees slightly bent. If you lie on your back, put a pillow under your knees.  Only take over-the-counter or prescription medicines as directed by your caregiver. Over-the-counter medicines to reduce pain and inflammation are often the most helpful.Your caregiver may prescribe muscle relaxant drugs.These medicines help dull your pain so you can more quickly return to your normal activities and healthy exercise.  Put ice on the injured area.  Put ice in a plastic bag.  Place a towel between your skin and the bag.  Leave the ice on for 15-20 minutes, 3-4 times a day for the first 2 to  3 days. After that, ice and heat may be alternated to reduce pain and spasms.  Ask your caregiver about trying back exercises and gentle massage. This may be of some benefit.  Avoid feeling anxious or stressed.Stress increases muscle tension and can worsen back pain.It is important to recognize when you are anxious or stressed and learn ways to manage it.Exercise is a great option. SEEK MEDICAL CARE IF:  You have pain that is not relieved with rest or medicine.  You have pain that does not improve in 1 week.  You have new symptoms.  You are generally not feeling well. SEEK IMMEDIATE MEDICAL CARE IF:   You have pain that radiates from your back into your legs.  You develop new bowel or bladder control problems.  You have unusual weakness or numbness in your arms or legs.  You develop nausea or vomiting.  You develop abdominal pain.  You feel faint. Document Released: 07/18/2005 Document Revised: 01/17/2012 Document Reviewed: 12/06/2010 [facility] Patient Information 2014 [place], [place].

## 2013-02-25 NOTE — Progress Notes (Signed)
Subjective:    Patient ID: Emily Compton, female    DOB: Dec 02, 1955, 57 y.o.   MRN: [id number]  Back Pain This is a chronic problem. The current episode started more than 1 year ago. The problem occurs intermittently. The problem is unchanged. The pain is present in the lumbar spine. The quality of the pain is described as aching. The pain does not radiate. The pain is at a severity of 5/10. The pain is moderate. The pain is worse during the day. The symptoms are aggravated by bending, position and standing. Pertinent negatives include no abdominal pain, bladder incontinence, bowel incontinence, chest pain, dysuria, fever, headaches, leg pain, numbness, paresis, paresthesias, pelvic pain, perianal numbness, tingling, weakness or weight loss. Treatments tried: valium and percocet provide excellent relief. The treatment provided significant relief.      Review of Systems  Constitutional: Negative.  Negative for fever, chills, weight loss, diaphoresis, activity change, appetite change, fatigue and unexpected weight change.  HENT: Negative.   Eyes: Negative.   Respiratory: Negative.  Negative for cough, chest tightness, shortness of breath, wheezing and stridor.   Cardiovascular: Negative.  Negative for chest pain, palpitations and leg swelling.  Gastrointestinal: Negative.  Negative for nausea, vomiting, abdominal pain, diarrhea, constipation and bowel incontinence.  Endocrine: Negative.   Genitourinary: Negative.  Negative for bladder incontinence, dysuria and pelvic pain.  Musculoskeletal: Positive for back pain. Negative for myalgias, joint swelling and gait problem.  Skin: Negative.   Allergic/Immunologic: Negative.   Neurological: Negative for dizziness, tingling, weakness, numbness, headaches and paresthesias.  Hematological: Negative.  Negative for adenopathy. Does not bruise/bleed easily.  Psychiatric/Behavioral: Negative.        Objective:   Physical Exam  Vitals  reviewed. Constitutional: She is oriented to person, place, and time. She appears well-developed and well-nourished. No distress.  HENT:  Head: Normocephalic and atraumatic.  Mouth/Throat: Oropharynx is clear and moist. No oropharyngeal exudate.  Eyes: Conjunctivae are normal. Right eye exhibits no discharge. Left eye exhibits no discharge. No scleral icterus.  Neck: Normal range of motion. Neck supple. No JVD present. No tracheal deviation present. No thyromegaly present.  Cardiovascular: Normal rate, regular rhythm, normal heart sounds and intact distal pulses.  Exam reveals no gallop and no friction rub.   No murmur heard. Pulmonary/Chest: Effort normal and breath sounds normal. No stridor. No respiratory distress. She has no wheezes. She has no rales. She exhibits no tenderness.  Abdominal: Soft. Bowel sounds are normal. She exhibits no distension and no mass. There is no tenderness. There is no rebound and no guarding.  Musculoskeletal: Normal range of motion. She exhibits no edema and no tenderness.       Lumbar back: Normal. She exhibits normal range of motion, no tenderness, no bony tenderness and no swelling.  Lymphadenopathy:    She has no cervical adenopathy.  Neurological: She is alert and oriented to person, place, and time. She has normal strength. She displays no atrophy, no tremor and normal reflexes. No cranial nerve deficit or sensory deficit. She exhibits normal muscle tone. She displays a negative Romberg sign. She displays no seizure activity. Coordination and gait normal. She displays no Babinski's sign on the right side. She displays no Babinski's sign on the left side.  Reflex Scores:      Tricep reflexes are 1+ on the right side and 1+ on the left side.      Bicep reflexes are 1+ on the right side and 1+ on the left side.  Brachioradialis reflexes are 1+ on the right side and 1+ on the left side.      Patellar reflexes are 1+ on the right side and 1+ on the left  side.      Achilles reflexes are 1+ on the right side and 1+ on the left side. Neg SLR in BLE  Skin: Skin is warm and dry. No rash noted. She is not diaphoretic. No erythema. No pallor.  Psychiatric: She has a normal mood and affect. Her behavior is normal. Judgment and thought content normal.      Lab Results  Component Value Date   WBC 6.7 02/24/2011   HGB 13.7 01/12/2012   HCT 41.7 02/24/2011   PLT 317 02/24/2011   GLUCOSE 93 02/24/2011   CHOL 274* 05/19/2010   TRIG 87.0 05/19/2010   HDL 77.80 05/19/2010   LDLDIRECT 174.8 05/19/2010   ALT 26 02/24/2011   AST 25 02/24/2011   NA 139 02/24/2011   K 4.0 02/24/2011   CL 100 02/24/2011   CREATININE 0.75 02/24/2011   BUN 11 02/24/2011   CO2 29 02/24/2011   TSH 1.45 05/19/2010   INR 0.95 02/24/2011      Assessment & Plan:

## 2013-02-25 NOTE — Assessment & Plan Note (Signed)
She has intermittent pain with no worsening s/s or radicular s/s Will continue percocet and valium as needed whenever she has a flare of pain

## 2013-02-25 NOTE — Assessment & Plan Note (Signed)
She has persistent rectal bleeding and has decided to see Dr. Madilyn Fireman about this, she has an appt. Next week

## 2013-02-25 NOTE — Assessment & Plan Note (Signed)
Exam done Vaccines were reviewed She was referred for a DEXA scan Labs ordered Pt ed material was given

## 2013-02-25 NOTE — Assessment & Plan Note (Signed)
She is doing well in PPI therapy

## 2013-02-26 ENCOUNTER — Encounter: Payer: Self-pay | Admitting: Internal Medicine

## 2013-02-26 LAB — VITAMIN D 25 HYDROXY (VIT D DEFICIENCY, FRACTURES): Vit D, 25-Hydroxy: 61 ng/mL (ref 30–89)

## 2013-04-08 ENCOUNTER — Encounter: Payer: Self-pay | Admitting: Gastroenterology

## 2013-04-08 ENCOUNTER — Ambulatory Visit (INDEPENDENT_AMBULATORY_CARE_PROVIDER_SITE_OTHER): Payer: BC Managed Care – PPO | Admitting: Gastroenterology

## 2013-04-08 VITALS — BP 100/80 | HR 64 | Ht 60.0 in | Wt 136.6 lb

## 2013-04-08 DIAGNOSIS — K625 Hemorrhage of anus and rectum: Secondary | ICD-10-CM

## 2013-04-08 NOTE — Patient Instructions (Addendum)
You have been scheduled for an appointment with ___________ at [facility]. Your appointment is on __________ at ___________. Please arrive at ______________ for registration. Make certain to bring a list of current medications, including any over the counter medications or vitamins. Also bring your co-pay if you have one as well as your insurance cards. [facility] is located at [address], [address]. Should you need to reschedule your appointment, please contact them at [phone].  

## 2013-04-08 NOTE — Assessment & Plan Note (Addendum)
Rectal bleeding is clearly from the lesion in the rectum which I believe is a protruding hemorrhoid.  I am not certain that this is amenable to band ligation.  I will seek a second opinion with general surgery regarding diagnosis and treatment.

## 2013-04-08 NOTE — Progress Notes (Signed)
History of Present Illness:  The patient has returned for reevaluation of rectal bleeding.  She underwent band ligation of 2 hemorrhoidal bundles in January, 2014.  Bleeding was significantly improved but has since returned.  Bleeding consists of blood on the toilet tissue and in minimal amounts coating the stool.  She denies rectal pain or pruritus.  Colonoscopy in 2012 demonstrated enlarged anal papillae and internal hemorrhoids as well as diverticulosis.  She moves her bowels regularly.    Review of Systems: Pertinent positive and negative review of systems were noted in the above HPI section. All other review of systems were otherwise negative.    Current Medications, Allergies, Past Medical History, Past Surgical History, Family History and Social History were reviewed in [employer] electronic medical record  Vital signs were reviewed in today's medical record. Physical Exam: General: Well developed , well nourished, no acute distress On rectal exam there is a grade 3-4 prolapsing slightly friable hemorrhoid

## 2013-04-30 ENCOUNTER — Other Ambulatory Visit (INDEPENDENT_AMBULATORY_CARE_PROVIDER_SITE_OTHER): Payer: Self-pay

## 2013-04-30 ENCOUNTER — Ambulatory Visit (INDEPENDENT_AMBULATORY_CARE_PROVIDER_SITE_OTHER): Payer: BC Managed Care – PPO | Admitting: General Surgery

## 2013-04-30 ENCOUNTER — Encounter (INDEPENDENT_AMBULATORY_CARE_PROVIDER_SITE_OTHER): Payer: Self-pay | Admitting: General Surgery

## 2013-04-30 VITALS — BP 120/66 | HR 64 | Temp 97.8°F | Resp 14 | Ht 60.0 in | Wt 135.8 lb

## 2013-04-30 DIAGNOSIS — K625 Hemorrhage of anus and rectum: Secondary | ICD-10-CM

## 2013-04-30 DIAGNOSIS — C801 Malignant (primary) neoplasm, unspecified: Secondary | ICD-10-CM

## 2013-04-30 HISTORY — DX: Malignant (primary) neoplasm, unspecified: C80.1

## 2013-04-30 NOTE — Patient Instructions (Signed)
We will call you with the biopsy results.

## 2013-04-30 NOTE — Progress Notes (Signed)
Chief Complaint  Patient presents with  . New Evaluation    eval bleeding hems    HISTORY: Emily Compton is a 57 y.o. female who presents to the office with rectal bleeding.  Other symptoms include occasional pain and burning.  This had been occurring for since 2012.  She has tried Rx creams and suppositories in the past with no success.  High fiber diet makes the symptoms better.   It is intermittent in nature.  Her bowel habits are regular and her bowel movements are soft.  Her fiber intake is dietary.  Her last colonoscopy was in 2012 and showed int hemorrhoids and diverticulosis.  She underwent banding with Dr Arlyce Dice in Jan of 2014.   She has been using a high fiber smoothie every day.       Past Medical History  Diagnosis Date  . Dental crowns present     and caps  . GERD (gastroesophageal reflux disease)     prn OTC  . Trigger middle finger of right hand 12/2011      Past Surgical History  Procedure Laterality Date  . Tubal ligation    . Tonsillectomy  age 31  . Lumbar fusion  03/02/2011    right transforaminal lumbar fusion L4-5; left posterolateral fusion L4-5  . Trigger finger release  01/12/2012    Procedure: RELEASE TRIGGER FINGER/A-1 PULLEY;  Surgeon: Tami Ribas, MD;  Location: [facility];  Service: Orthopedics;  Laterality: Right;  right long trigger release  . Hemorrhoid banding  08/29/2012    Procedure: HEMORRHOID BANDING;  Surgeon: Louis Meckel, MD;  Location: [facility];  Service: Endoscopy;  Laterality: N/A;  . Flexible sigmoidoscopy  08/29/2012    Procedure: FLEXIBLE SIGMOIDOSCOPY;  Surgeon: Louis Meckel, MD;  Location: [facility];  Service: Endoscopy;  Laterality: N/A;        Current Outpatient Prescriptions  Medication Sig Dispense Refill  . diazepam (VALIUM) 10 MG tablet Take 1 tablet (10 mg total) by mouth every 12 (twelve) hours as needed.  60 tablet  3  . omeprazole (PRILOSEC OTC) 20 MG tablet Take 20 mg by mouth daily. Take one  tablet as needed for acid reflux       . oxyCODONE-acetaminophen (PERCOCET) 10-325 MG per tablet Take 1 tablet by mouth every 4 (four) hours as needed for pain.       No current facility-administered medications for this visit.      Allergies  Allergen Reactions  . Adhesive [Tape] Rash      Family History  Problem Relation Age of Onset  . Breast cancer Mother   . Uterine cancer Mother   . Brain cancer Mother   . Cancer Mother     breast & uterin & brain  . Colon cancer Neg Hx     History   Social History  . Marital Status: Married    Spouse Name: N/A    Number of Children: 0  . Years of Education: N/A   Occupational History  . office     middle school  .  [employer]   Social History Main Topics  . Smoking status: Never Smoker   . Smokeless tobacco: Never Used  . Alcohol Use: 0.0 oz/week     Comment: occasionally  . Drug Use: No  . Sexual Activity: Not Currently   Other Topics Concern  . None   Social History Narrative  . None      REVIEW OF SYSTEMS -  PERTINENT POSITIVES ONLY: Review of Systems - General ROS: negative for - chills, fever or weight loss Hematological and Lymphatic ROS: negative for - bleeding problems, blood clots or bruising Respiratory ROS: no cough, shortness of breath, or wheezing Cardiovascular ROS: no chest pain or dyspnea on exertion Gastrointestinal ROS: positive for - blood in stools negative for - abdominal pain, change in bowel habits, constipation or diarrhea Genito-Urinary ROS: no dysuria, trouble voiding, or hematuria  EXAM: Filed Vitals:   04/30/13 1054  BP: 120/66  Pulse: 64  Temp: 97.8 F (36.6 C)  Resp: 14    General appearance: alert and cooperative Resp: clear to auscultation bilaterally Cardio: regular rate and rhythm GI: normal findings: soft, non-tender   Procedure: Anoscopy Surgeon: Maisie Fus Diagnosis: rectal bleeding  Assistant: Meghan MA After the risks and benefits were explained, verbal  consent was obtained for above procedure  Anesthesia: none Findings: L lateral anal mass, concerning for neoplasia, fixed to underlying tissue, biopsy performed.  Hemostasis achieved with silver nitrate    ASSESSMENT AND PLAN: Emily Compton is a 57 y.o. F with rectal bleeding and has been treated for internal hemorrhoids with banding and medical management.  On exam she has a L lateral anal mass that is fixed to underlying tissue.  I am concerned that this is an anal cancer.  I will await biopsy results.  If this is inconclusive, she will need an EUA with further biopsies.  If this shows anal cancer, then she will needs referrals to oncology and radiation oncology as well as staging PET CT Scans.      Vanita Panda, MD Colon and Rectal Surgery / General Surgery [facility]      Visit Diagnoses: 1. Anal bleeding     Primary Care Physician: Sanda Linger, MD

## 2013-05-02 ENCOUNTER — Other Ambulatory Visit (INDEPENDENT_AMBULATORY_CARE_PROVIDER_SITE_OTHER): Payer: Self-pay | Admitting: General Surgery

## 2013-05-02 ENCOUNTER — Telehealth (INDEPENDENT_AMBULATORY_CARE_PROVIDER_SITE_OTHER): Payer: Self-pay | Admitting: General Surgery

## 2013-05-02 DIAGNOSIS — C210 Malignant neoplasm of anus, unspecified: Secondary | ICD-10-CM

## 2013-05-02 NOTE — Telephone Encounter (Signed)
Referral and test information given to the referral coordinator to arrange.

## 2013-05-02 NOTE — Telephone Encounter (Signed)
Discussed anal cancer results with pt.  Told her we will set up CT scans and radiation onc and oncology apts will be made.  She should hear back from [place] within the next 24 h.  She appeared to understand and asked appropriate questions.

## 2013-05-03 ENCOUNTER — Telehealth (INDEPENDENT_AMBULATORY_CARE_PROVIDER_SITE_OTHER): Payer: Self-pay | Admitting: *Deleted

## 2013-05-03 ENCOUNTER — Encounter: Payer: Self-pay | Admitting: Radiation Oncology

## 2013-05-03 NOTE — Telephone Encounter (Signed)
I spoke with pt to inform her of her appt for her CT scan at [facility] radiology on 10/6 with an arrival time of 2:15pm.  Instructed pt to pick up her contrast today from [facility] radiology.  Pt states understanding.

## 2013-05-03 NOTE — Progress Notes (Signed)
GU Location of Tumor / Histology:Rectal left lateral anal canal  Patient presented  months ago with signs/symptoms of: 2 years rectal bleeding since 2012,occasioanl pain and burning,intermittent in nature  Biopsies of 04/30/13: Diagnosis Rectum, biopsy, left lateral anal canal- POORLY DIFFERENTIATED SQUAMOUS CELL CARCINOMA.  Past/Anticipated interventions by urology, if any: no  Past/Anticipated interventions by medical oncology, if any: seen 04/30/13, Lonna Cobb, MD, appt for CT chest/abd/pelvis 05/06/13  Results in   Weight changes, if any: no Bowel/Bladder complaints, if any: bowels regular /soft,on high fiber diet, ,  Nausea/Vomiting, if any:no  Pain issues, if any: 1-2 soreness SAFETY ISSUES:  Prior radiation?no  Pacemaker/ICD? no Is the patient on methotrexate? no Current Complaints / other details: Married, no children,   last colonoscopy 2012 =enlarged anal papillae and internal hemorrhoids, , hemorrhoid  banding with Dr.Kaplan January 1/29/ 2014, sigmoidoscopy 08/29/12 Never smoker, occasional alcohol use weekly. No drug use Mother deceased, breast/uterine and brain  cancer

## 2013-05-06 ENCOUNTER — Other Ambulatory Visit (HOSPITAL_COMMUNITY): Payer: BC Managed Care – PPO

## 2013-05-06 ENCOUNTER — Encounter (HOSPITAL_COMMUNITY): Payer: Self-pay

## 2013-05-06 ENCOUNTER — Ambulatory Visit
Admission: RE | Admit: 2013-05-06 | Discharge: 2013-05-06 | Disposition: A | Discharge status: Home / Self Care | Payer: BC Managed Care – PPO | Source: Ambulatory Visit | Attending: Radiation Oncology | Admitting: Radiation Oncology

## 2013-05-06 ENCOUNTER — Telehealth (INDEPENDENT_AMBULATORY_CARE_PROVIDER_SITE_OTHER): Payer: Self-pay

## 2013-05-06 ENCOUNTER — Telehealth: Payer: Self-pay | Admitting: *Deleted

## 2013-05-06 ENCOUNTER — Ambulatory Visit (HOSPITAL_COMMUNITY): Payer: BC Managed Care – PPO

## 2013-05-06 ENCOUNTER — Ambulatory Visit (HOSPITAL_COMMUNITY)
Admission: RE | Admit: 2013-05-06 | Discharge: 2013-05-06 | Disposition: A | Discharge status: Home / Self Care | Payer: BC Managed Care – PPO | Source: Ambulatory Visit | Attending: General Surgery | Admitting: General Surgery

## 2013-05-06 ENCOUNTER — Encounter: Payer: Self-pay | Admitting: Radiation Oncology

## 2013-05-06 VITALS — BP 111/79 | HR 72 | Temp 98.3°F | Resp 20 | Ht 60.0 in | Wt 134.9 lb

## 2013-05-06 DIAGNOSIS — K5730 Diverticulosis of large intestine without perforation or abscess without bleeding: Secondary | ICD-10-CM | POA: Insufficient documentation

## 2013-05-06 DIAGNOSIS — C210 Malignant neoplasm of anus, unspecified: Secondary | ICD-10-CM | POA: Insufficient documentation

## 2013-05-06 DIAGNOSIS — K219 Gastro-esophageal reflux disease without esophagitis: Secondary | ICD-10-CM | POA: Insufficient documentation

## 2013-05-06 DIAGNOSIS — C20 Malignant neoplasm of rectum: Secondary | ICD-10-CM

## 2013-05-06 DIAGNOSIS — C211 Malignant neoplasm of anal canal: Secondary | ICD-10-CM | POA: Insufficient documentation

## 2013-05-06 HISTORY — DX: Malignant (primary) neoplasm, unspecified: C80.1

## 2013-05-06 MED ORDER — IOHEXOL 300 MG/ML  SOLN
100.0000 mL | Freq: Once | INTRAMUSCULAR | Status: AC | PRN
  Administered 2013-05-06: 100 mL via INTRAVENOUS

## 2013-05-06 NOTE — Progress Notes (Addendum)
Radiation Oncology         [phone] ________________________________  Name: Emily Compton MRN: [id number]  Date: 05/06/2013  DOB: 08-23-1955  CC:Sanda Linger, MD  Etta Grandchild, MD     REFERRING PHYSICIAN: Etta Grandchild, MD   DIAGNOSIS: Squamous cell carcinoma of the anal canal  HISTORY OF PRESENT ILLNESS::Emily Compton is a 57 y.o. female who is seen for an initial consultation visit. The patient describes a history of rectal bleeding and as well as some occasional pain and burning. The patient indicates that she has had a fairly long history of rectal bleeding extending back to 2012. She had a colonoscopy at that time which was negative for malignancy. She had some hemorrhoids and diverticulosis. More recently the patient was treated for hemorrhoids with a banding procedure. She indicates that her bleeding did significantly improved. However over the last several months rectal bleeding has returned and she sought additional medical attention for this.  Evaluation of the patient has revealed a left lateral anal tumor which was suspicious for malignancy. A biopsy was performed and the pathology revealed poorly differentiated squamous cell carcinoma  CT imaging for staging purposes were performed earlier today. A CT scan of the chest abdomen and pelvis was completed. There were no acute findings and no evidence of metastatic disease. A PET scan is pending.   PREVIOUS RADIATION THERAPY: No   PAST MEDICAL HISTORY:  has a past medical history of Dental crowns present; GERD (gastroesophageal reflux disease); Trigger middle finger of right hand (12/2011); and Cancer (04/30/13).     PAST SURGICAL HISTORY: Past Surgical History  Procedure Laterality Date  . Tubal ligation    . Tonsillectomy  age 44  . Lumbar fusion  03/02/2011    right transforaminal lumbar fusion L4-5; left posterolateral fusion L4-5  . Trigger finger release  01/12/2012    Procedure: RELEASE TRIGGER  FINGER/A-1 PULLEY;  Surgeon: Tami Ribas, MD;  Location: [facility];  Service: Orthopedics;  Laterality: Right;  right long trigger release  . Hemorrhoid banding  08/29/2012    Procedure: HEMORRHOID BANDING;  Surgeon: Louis Meckel, MD;  Location: [facility];  Service: Endoscopy;  Laterality: N/A;  . Flexible sigmoidoscopy  08/29/2012    Procedure: FLEXIBLE SIGMOIDOSCOPY;  Surgeon: Louis Meckel, MD;  Location: [facility];  Service: Endoscopy;  Laterality: N/A;     FAMILY HISTORY: family history includes Brain cancer in her mother; Breast cancer in her mother; Cancer in her mother; Uterine cancer in her mother. There is no history of Colon cancer.   SOCIAL HISTORY:  reports that she has never smoked. She has never used smokeless tobacco. She reports that  drinks alcohol. She reports that she does not use illicit drugs.   ALLERGIES: Adhesive   MEDICATIONS:  Current Outpatient Prescriptions  Medication Sig Dispense Refill  . diazepam (VALIUM) 10 MG tablet Take 1 tablet (10 mg total) by mouth every 12 (twelve) hours as needed.  60 tablet  3  . omeprazole (PRILOSEC OTC) 20 MG tablet Take 20 mg by mouth daily. Take one tablet as needed for acid reflux       . oxyCODONE-acetaminophen (PERCOCET) 10-325 MG per tablet Take 1 tablet by mouth every 4 (four) hours as needed for pain.       No current facility-administered medications for this encounter.     REVIEW OF SYSTEMS:  A 15 point review of systems is documented in the electronic medical record. This was  obtained by the nursing staff. However, I reviewed this with the patient to discuss relevant findings and make appropriate changes.  Pertinent items are noted in HPI.    PHYSICAL EXAM:  height is 5' (1.524 m) and weight is 134 lb 14.4 oz (61.19 kg). Her oral temperature is 98.3 F (36.8 C). Her blood pressure is 111/79 and her pulse is 72. Her respiration is 20.   General: Well-developed, in no acute  distress HEENT: Normocephalic, atraumatic; oral cavity clear Neck: Supple without any lymphadenopathy Cardiovascular: Regular rate and rhythm Respiratory: Clear to auscultation bilaterally GI: Soft, nontender, normal bowel sounds Extremities: No edema present Neuro: No focal deficits Lymph nodes: No inguinal lymphadenopathy present Rectal exam:  A palpable tumor is present within the anal canal on the left laterally. This extends for approximately 2 cm. No other suspicious findings externally or on digital rectal exam. Immobilized on exam glove.  LABORATORY DATA:  Lab Results  Component Value Date   WBC 4.5 02/25/2013   HGB 13.6 02/25/2013   HCT 40.1 02/25/2013   MCV 86.4 02/25/2013   PLT 244.0 02/25/2013   Lab Results  Component Value Date   NA 139 02/25/2013   K 4.0 02/25/2013   CL 104 02/25/2013   CO2 27 02/25/2013   Lab Results  Component Value Date   ALT 14 02/25/2013   AST 22 02/25/2013   ALKPHOS 57 02/25/2013   BILITOT 0.5 02/25/2013      RADIOGRAPHY: Ct Chest W Contrast  05/06/2013   CLINICAL DATA:  Recent diagnosis of anal cancer  EXAM: CT CHEST, ABDOMEN, AND PELVIS WITH CONTRAST  TECHNIQUE: Multidetector CT imaging of the chest, abdomen and pelvis was performed following the standard protocol during bolus administration of intravenous contrast.  CONTRAST:  OMNIPAQUE IOHEXOL 300 MG/ML  SOLN  COMPARISON:  None  FINDINGS: CT CHEST FINDINGS  No pleural effusion identified. There is no airspace consolidation identified. No suspicious pulmonary nodule or mass identified. No airspace consolidation identified.  The trachea appears patent and is midline. The heart size is normal. No pericardial effusion. No enlarged mediastinal or hilar lymph nodes.  There is no supraclavicular or axillary adenopathy identified. Review of the visualized bony structures shows no aggressive lytic or sclerotic bone lesions.  CT ABDOMEN AND PELVIS FINDINGS  There is a small hypo attenuating structure  within the anterior left hepatic lobe. This is too small to characterize but likely represents a small cysts. Gallbladder is normal. No biliary dilatation. Normal appearance of the pancreas. The spleen is unremarkable.  The adrenal glands are both normal. Normal appearance of the kidneys. The urinary bladder is normal. The uterus and adnexal structures are on unremarkable.  Normal caliber of the abdominal aorta. There is no retroperitoneal adenopathy identified. No small bowel mesenteric adenopathy noted. There is no pelvic or inguinal adenopathy identified. No ascites. No peritoneal nodule or mass identified.  The stomach is normal. The small bowel loops are on unremarkable. Normal appearance of the colon. The rectum appears grossly unremarkable. No mass identified.  Review of the visualized osseous structures is remarkable for post off change from posterior lumbar fusion at the L4-5 level.  IMPRESSION: CT CHEST IMPRESSION  1. No acute findings. No evidence for metastatic disease.  CT ABDOMEN AND PELVIS IMPRESSION  1. No acute findings.  2. No evidence for metastatic disease.   Electronically Signed   By: Signa Kell M.D.   On: 05/06/2013 13:29   Ct Abdomen Pelvis W Contrast  05/06/2013  CLINICAL DATA:  Recent diagnosis of anal cancer  EXAM: CT CHEST, ABDOMEN, AND PELVIS WITH CONTRAST  TECHNIQUE: Multidetector CT imaging of the chest, abdomen and pelvis was performed following the standard protocol during bolus administration of intravenous contrast.  CONTRAST:  OMNIPAQUE IOHEXOL 300 MG/ML  SOLN  COMPARISON:  None  FINDINGS: CT CHEST FINDINGS  No pleural effusion identified. There is no airspace consolidation identified. No suspicious pulmonary nodule or mass identified. No airspace consolidation identified.  The trachea appears patent and is midline. The heart size is normal. No pericardial effusion. No enlarged mediastinal or hilar lymph nodes.  There is no supraclavicular or axillary adenopathy  identified. Review of the visualized bony structures shows no aggressive lytic or sclerotic bone lesions.  CT ABDOMEN AND PELVIS FINDINGS  There is a small hypo attenuating structure within the anterior left hepatic lobe. This is too small to characterize but likely represents a small cysts. Gallbladder is normal. No biliary dilatation. Normal appearance of the pancreas. The spleen is unremarkable.  The adrenal glands are both normal. Normal appearance of the kidneys. The urinary bladder is normal. The uterus and adnexal structures are on unremarkable.  Normal caliber of the abdominal aorta. There is no retroperitoneal adenopathy identified. No small bowel mesenteric adenopathy noted. There is no pelvic or inguinal adenopathy identified. No ascites. No peritoneal nodule or mass identified.  The stomach is normal. The small bowel loops are on unremarkable. Normal appearance of the colon. The rectum appears grossly unremarkable. No mass identified.  Review of the visualized osseous structures is remarkable for post off change from posterior lumbar fusion at the L4-5 level.  IMPRESSION: CT CHEST IMPRESSION  1. No acute findings. No evidence for metastatic disease.  CT ABDOMEN AND PELVIS IMPRESSION  1. No acute findings.  2. No evidence for metastatic disease.   Electronically Signed   By: Signa Kell M.D.   On: 05/06/2013 13:29       IMPRESSION: The patient has a diagnosis of squamous cell carcinoma of the anus. Staging studies at this time reveal squamous cell carcinoma of the anal canal. I would stage this is a small T2, N0, M0 tumor. Further workup includes a PET scan which is pending.  Notable aspects of the case include a history of rectal bleeding extending back a couple of years. I suspect that this was due to hemorrhoids and the more remote past, more recently anal cancer which has been diagnosed and appears to represent an early stage tumor. On CT imaging, and no regional disease.  Based on the  current information available, the patient appears to be an appropriate candidate for definitve chemoradiotherapy. I therefore discussed a potential 5 1/2 - 6 week course of radiation with the patient. We discussed the rationale of this treatment, including the expected potential benefit. We also discussed the potential side effects and risks of such a treatment as well. All of her questions were answered.   PLAN: Simulation in the near future such that we can proceed with treatment planning. A PET scan will also be completed as soon as possible. At this time, I anticipate beginning the patient's treatment on 05/20/2013.     I spent 60 minutes face to face with the patient and more than 50% of that time was spent in counseling and/or coordination of care.     Addendum:  The patient's PET scan is scheduled for 05/20/2013. We will therefore tentatively plan to start the patient's treatment on 05/27/2013. ________________________________  Radene Gunning, MD, PhD

## 2013-05-06 NOTE — Progress Notes (Signed)
Please see the Nurse Progress Note in the MD Initial Consult Encounter for this patient. 

## 2013-05-06 NOTE — Telephone Encounter (Signed)
Spoke with patient by phone and confirmed all of her appointments with Dr. Truett Perna and Dr. Mitzi Hansen.  Contact names and numbers were provided.  She was without questions.

## 2013-05-06 NOTE — Telephone Encounter (Signed)
Message copied by Ivory Broad on Mon May 06, 2013  4:31 PM ------      Message from: [place], Broadus John.      Created: Mon May 06, 2013  3:47 PM       Please let her know the CT's showed no evidence of metastatic disease.             AT      ----- Message -----         From: Rad Results In Interface         Sent: 05/06/2013   1:31 PM           To: Romie Levee, MD                   ------

## 2013-05-06 NOTE — Telephone Encounter (Signed)
Left message for pt to call and get message below from Dr Maisie Fus about her CT results.

## 2013-05-07 ENCOUNTER — Telehealth: Payer: Self-pay | Admitting: *Deleted

## 2013-05-07 NOTE — Telephone Encounter (Signed)
Called patient to inform of test, lvm for a return call 

## 2013-05-07 NOTE — Telephone Encounter (Signed)
Patient aware of Ct results . Patient states Dr. Mitzi Hansen thought Dr. Maisie Fus was going to order Pet Scan when she had the Ct Please call pt

## 2013-05-09 ENCOUNTER — Ambulatory Visit: Payer: BC Managed Care – PPO | Admitting: Radiation Oncology

## 2013-05-14 ENCOUNTER — Encounter: Payer: Self-pay | Admitting: Oncology

## 2013-05-14 ENCOUNTER — Encounter: Payer: BC Managed Care – PPO | Admitting: Nutrition

## 2013-05-14 ENCOUNTER — Ambulatory Visit: Payer: BC Managed Care – PPO

## 2013-05-14 ENCOUNTER — Other Ambulatory Visit: Payer: Self-pay | Admitting: *Deleted

## 2013-05-14 ENCOUNTER — Telehealth: Payer: Self-pay | Admitting: Oncology

## 2013-05-14 ENCOUNTER — Ambulatory Visit (HOSPITAL_BASED_OUTPATIENT_CLINIC_OR_DEPARTMENT_OTHER): Payer: BC Managed Care – PPO | Admitting: Oncology

## 2013-05-14 ENCOUNTER — Encounter: Payer: Self-pay | Admitting: *Deleted

## 2013-05-14 VITALS — BP 115/76 | HR 68 | Temp 98.2°F | Resp 20 | Ht 60.0 in | Wt 137.3 lb

## 2013-05-14 DIAGNOSIS — K625 Hemorrhage of anus and rectum: Secondary | ICD-10-CM

## 2013-05-14 DIAGNOSIS — C210 Malignant neoplasm of anus, unspecified: Secondary | ICD-10-CM

## 2013-05-14 NOTE — Telephone Encounter (Signed)
gv and printed appt sched an avs for pt for OCT adn NOV...MDal add tx.

## 2013-05-14 NOTE — Progress Notes (Signed)
Clinical Social Worker received referral from physician regarding anxiety associated with a new diagnosis.  CSW met with pt in the exam room to offer support and assess for needs.  Pt was visibly anxious and expressed concerns for the "stigma" associated with her cancer diagnosis.  CSW provided pt with a place to express her concerns, and validated her feelings.  CSW informed pt of the support team and support services available at [facility].  Pt acknowledged the need for emotional support, and expressed interest in "talking with someone" who has had a similar experience.  CSw and pt discussed support group and pt plans to attend the next meeting.  Pt was also open to counseling and other support service, and will contact CSW if she wishes to participate.  CSW provided contact information and encouraged pt to call with any questions or concerns.    Tamala Julian, MSW, LCSW Clinical Social Worker [facility] [phone]

## 2013-05-14 NOTE — Progress Notes (Signed)
Met with Emily Compton. Explained role of nurse navigator. Educational information provided on anal cancer  Referral made to dietician for diet education and to Outpatient Rehab. PT for pre-rehabilitation consult.Marland Kitchen [facility] resources provided to patient, including SW service information.  She was seen by SW today for counseling.  Contact names and phone numbers were provided for entire [facility] team.  Teach back method was used.  No barriers to care identified.  Will continue to follow as needed.

## 2013-05-14 NOTE — Progress Notes (Signed)
Checked in new pt with no financial concerns. °

## 2013-05-14 NOTE — Progress Notes (Signed)
[facility] New Patient Consult   Referring MD: Dayanara Panzy Bubeck Hrivnak 57 y.o.  1956/04/28    Reason for Referral: Anal cancer     HPI: She reports developing rectal bleeding beginning in August of 2012. In January of 2014 she underwent banding of hemorrhoids with partial improvement in the bleeding. The bleeding persisted and she saw Dr. Arlyce Dice on 04/08/2013. A prolapsing hemorrhoid was noted. She was referred to Dr. Maisie Fus on 04/30/2013 and a left lateral anal mass was noted. A biopsy was performed and the pathology ([id number]) revealed a poorly differentiated squamous cell carcinoma, p16 positive.  She was referred to Dr. Mitzi Hansen and is scheduled for radiation simulation later this week.  CTs of the chest, abdomen, and pelvis on 05/06/2013 revealed no evidence of metastatic disease in the chest. Too small to characterize hypoattenuation structure in the anterior left liver. No rectal peritoneal adenopathy. No pelvic or inguinal adenopathy. No ascites. No rectal mass identified.  Past Medical History  Diagnosis Date  . Dental crowns present     and caps  . GERD (gastroesophageal reflux disease)     prn OTC  . Trigger middle finger of right hand 12/2011  . Cancer 04/30/13    anal =poorly diff squamous cell ca     .   G2 P1, tubal pregnancy, one child died at an early age with spina bifida  Past Surgical History  Procedure Laterality Date  . Tubal ligation    . Tonsillectomy  age 29  . Lumbar fusion  03/02/2011    right transforaminal lumbar fusion L4-5; left posterolateral fusion L4-5  . Trigger finger release  01/12/2012    Procedure: RELEASE TRIGGER FINGER/A-1 PULLEY;  Surgeon: Tami Ribas, MD;  Location: [facility];  Service: Orthopedics;  Laterality: Right;  right long trigger release  . Hemorrhoid banding  08/29/2012    Procedure: HEMORRHOID BANDING;  Surgeon: Louis Meckel, MD;  Location: [facility];  Service: Endoscopy;   Laterality: N/A;  . Flexible sigmoidoscopy  08/29/2012    Procedure: FLEXIBLE SIGMOIDOSCOPY;  Surgeon: Louis Meckel, MD;  Location: [facility];  Service: Endoscopy;  Laterality: N/A;    Family History  Problem Relation Age of Onset  . Breast cancer Mother  26   . Uterine cancer Mother  21   . Brain cancer Mother  78   . Cancer Mother     breast & uterin & brain  . Colon cancer Neg Hx   Precancerous bladder lesion in her father. 2 sisters. No other family history of cancer.  Current outpatient prescriptions:diazepam (VALIUM) 10 MG tablet, Take 1 tablet (10 mg total) by mouth every 12 (twelve) hours as needed., Disp: 60 tablet, Rfl: 3;  omeprazole (PRILOSEC OTC) 20 MG tablet, Take 20 mg by mouth daily. Take one tablet as needed for acid reflux , Disp: , Rfl: ;  oxyCODONE-acetaminophen (PERCOCET) 10-325 MG per tablet, Take 1 tablet by mouth every 4 (four) hours as needed for pain., Disp: , Rfl:   Allergies:  Allergies  Allergen Reactions  . Adhesive [Tape] Rash    Social History: She lives with her husband and Willis. She is currently unemployed and previously worked in an office occupation. No cigarette use. Social alcohol use. She was transfused with red cells at the time of the tubal pregnancy. No risk factor for HIV or hepatitis.   ROS:   Positives include: Rectal "pressure ", rectal bleeding  A complete ROS was  otherwise negative.  Physical Exam:  Blood pressure 115/76, pulse 68, temperature 98.2 F (36.8 C), temperature source Oral, resp. rate 20, height 5' (1.524 m), weight 137 lb 4.8 oz (62.279 kg).  HEENT: Oropharynx without visible mass, neck without mass Lungs: Clear bilaterally Cardiac: Regular rate and rhythm Abdomen: No hepatosplenomegaly, nontender, no mass  Vascular: No leg edema Lymph nodes: No cervical, supraclavicular, axillary, or inguinal nodes Neurologic: Alert and oriented, the motor exam appears intact in the upper and lower extremities Skin:  No rash Rectal: Small external hemorrhoids, mass at the left anal canal extending over approximately 2 cm   LAB:  CBC  Lab Results  Component Value Date   WBC 4.5 02/25/2013   HGB 13.6 02/25/2013   HCT 40.1 02/25/2013   MCV 86.4 02/25/2013   PLT 244.0 02/25/2013     CMP      Component Value Date/Time   NA 139 02/25/2013 0919   K 4.0 02/25/2013 0919   CL 104 02/25/2013 0919   CO2 27 02/25/2013 0919   GLUCOSE 90 02/25/2013 0919   BUN 15 02/25/2013 0919   CREATININE 0.8 02/25/2013 0919   CALCIUM 9.4 02/25/2013 0919   PROT 7.3 02/25/2013 0919   ALBUMIN 4.2 02/25/2013 0919   AST 22 02/25/2013 0919   ALT 14 02/25/2013 0919   ALKPHOS 57 02/25/2013 0919   BILITOT 0.5 02/25/2013 0919   GFRNONAA >60 02/24/2011 1228   GFRAA >60 02/24/2011 1228     Radiology: As per history of present illness    Assessment/Plan:   1. Squamous cell carcinoma of the anus, clinical stage II (T2 N0)  2. History of hemorrhoids 3. Rectal bleeding secondary to #1   Disposition:   Ms. Groesbeck has been diagnosed with squamous cell carcinoma of the anus. She appears to have localized disease based on the clinical evaluation to date. She is scheduled for a staging PET scan.  I discussed the treatment of anal cancer with her today. I recommend concurrent chemotherapy and radiation. She is scheduled to begin radiation on 05/27/2013. The plan is to begin concurrent 5-fluorouracil/mitomycin-C on that day. She will receive chemotherapy during weeks 1 and 5 of radiation.  We discussed the potential toxicities associated with the 5-fluorouracil/mitomycin C. regimen including the chance for nausea/vomiting, mucositis, diarrhea, alopecia, and hematologic toxicity. We reviewed the rash, hyperpigmentation, and hand/foot syndrome associated with 5-fluorouracil. We discussed the hemolytic uremic syndrome rarely seen with mitomycin-C. We also discussed the likelihood of skin toxicity at the perineum and labia.  She will attend a  chemotherapy teaching class. We will arrange for placement of a PICC prior to beginning chemotherapy on 05/27/2013. She will be scheduled for an office visit and nadir CBC on 06/06/2013.  She met with the Cancer Center social worker today to discuss support groups and individual counseling. We referred her to the pelvic physical therapy clinic.   Approximately 60 minutes were spent with patient today. The majority of the time was used for counseling and coordination of care. Ladina Shutters 05/14/2013, 5:34 PM

## 2013-05-15 ENCOUNTER — Other Ambulatory Visit: Payer: Self-pay | Admitting: Radiation Oncology

## 2013-05-15 ENCOUNTER — Ambulatory Visit
Admission: RE | Admit: 2013-05-15 | Discharge: 2013-05-15 | Disposition: A | Discharge status: Home / Self Care | Payer: BC Managed Care – PPO | Source: Ambulatory Visit | Attending: Radiation Oncology | Admitting: Radiation Oncology

## 2013-05-15 DIAGNOSIS — L259 Unspecified contact dermatitis, unspecified cause: Secondary | ICD-10-CM | POA: Insufficient documentation

## 2013-05-15 DIAGNOSIS — K644 Residual hemorrhoidal skin tags: Secondary | ICD-10-CM | POA: Insufficient documentation

## 2013-05-15 DIAGNOSIS — Z79899 Other long term (current) drug therapy: Secondary | ICD-10-CM | POA: Insufficient documentation

## 2013-05-15 DIAGNOSIS — Z510 Encounter for antineoplastic radiation therapy: Secondary | ICD-10-CM | POA: Insufficient documentation

## 2013-05-15 DIAGNOSIS — C210 Malignant neoplasm of anus, unspecified: Secondary | ICD-10-CM

## 2013-05-16 ENCOUNTER — Ambulatory Visit: Payer: BC Managed Care – PPO | Attending: Oncology | Admitting: Physical Therapy

## 2013-05-16 DIAGNOSIS — IMO0002 Reserved for concepts with insufficient information to code with codable children: Secondary | ICD-10-CM | POA: Insufficient documentation

## 2013-05-16 DIAGNOSIS — IMO0001 Reserved for inherently not codable concepts without codable children: Secondary | ICD-10-CM | POA: Insufficient documentation

## 2013-05-16 DIAGNOSIS — M62838 Other muscle spasm: Secondary | ICD-10-CM | POA: Insufficient documentation

## 2013-05-20 ENCOUNTER — Encounter (HOSPITAL_COMMUNITY)
Admission: RE | Admit: 2013-05-20 | Discharge: 2013-05-20 | Disposition: A | Discharge status: Home / Self Care | Payer: BC Managed Care – PPO | Source: Ambulatory Visit | Attending: Radiation Oncology | Admitting: Radiation Oncology

## 2013-05-20 ENCOUNTER — Other Ambulatory Visit (HOSPITAL_BASED_OUTPATIENT_CLINIC_OR_DEPARTMENT_OTHER): Payer: BC Managed Care – PPO

## 2013-05-20 ENCOUNTER — Other Ambulatory Visit: Payer: Self-pay | Admitting: *Deleted

## 2013-05-20 ENCOUNTER — Other Ambulatory Visit: Payer: BC Managed Care – PPO

## 2013-05-20 DIAGNOSIS — C210 Malignant neoplasm of anus, unspecified: Secondary | ICD-10-CM

## 2013-05-20 LAB — CBC WITH DIFFERENTIAL/PLATELET
BASO%: 0.5 % (ref 0.0–2.0)
Basophils Absolute: 0 10*3/uL (ref 0.0–0.1)
EOS%: 1.6 % (ref 0.0–7.0)
Eosinophils Absolute: 0.1 10*3/uL (ref 0.0–0.5)
HGB: 13.8 g/dL (ref 11.6–15.9)
LYMPH%: 32.2 % (ref 14.0–49.7)
MCH: 28.7 pg (ref 25.1–34.0)
MCHC: 33 g/dL (ref 31.5–36.0)
MCV: 86.9 fL (ref 79.5–101.0)
MONO#: 0.6 10*3/uL (ref 0.1–0.9)
MONO%: 9.6 % (ref 0.0–14.0)
Platelets: 239 10*3/uL (ref 145–400)
RBC: 4.81 10*6/uL (ref 3.70–5.45)
RDW: 13.8 % (ref 11.2–14.5)
WBC: 5.8 10*3/uL (ref 3.9–10.3)
lymph#: 1.9 10*3/uL (ref 0.9–3.3)

## 2013-05-20 LAB — COMPREHENSIVE METABOLIC PANEL (CC13)
AST: 19 U/L (ref 5–34)
Albumin: 3.8 g/dL (ref 3.5–5.0)
Alkaline Phosphatase: 63 U/L (ref 40–150)
BUN: 12.3 mg/dL (ref 7.0–26.0)
Chloride: 107 mEq/L (ref 98–109)
Glucose: 93 mg/dl (ref 70–140)
Potassium: 4.8 mEq/L (ref 3.5–5.1)
Sodium: 142 mEq/L (ref 136–145)
Total Bilirubin: 0.32 mg/dL (ref 0.20–1.20)
Total Protein: 7.4 g/dL (ref 6.4–8.3)

## 2013-05-20 MED ORDER — FLUDEOXYGLUCOSE F - 18 (FDG) INJECTION
17.3000 | Freq: Once | INTRAVENOUS | Status: AC | PRN
  Administered 2013-05-20: 17.3 via INTRAVENOUS

## 2013-05-20 MED ORDER — PROCHLORPERAZINE MALEATE 10 MG PO TABS: 10.0000 mg | ORAL_TABLET | Freq: Four times a day (QID) | ORAL | Status: DC | PRN

## 2013-05-20 NOTE — Progress Notes (Signed)
  Radiation Oncology         [phone] ________________________________  Name: Emily Compton MRN: [id number]  Date: 05/15/2013  DOB: Mar 07, 1956  SIMULATION AND TREATMENT PLANNING NOTE   CONSENT VERIFIED: yes   SET UP: Patient is set-up supine   IMMOBILIZATION: The following immobilization is used: alpha-cradle. This complex treatment device will be used on a daily basis during the patient's treatment.   Diagnosis: Anal cancer   NARRATIVE: The patient was brought to the CT Simulation planning suite. Identity was confirmed. All relevant records and images related to the planned course of therapy were reviewed. Then, the patient was positioned in a stable reproducible clinical set-up for radiation therapy using an aquaplast mask. Skin markings were placed. The CT images were loaded into the planning software where the target and avoidance structures were contoured.The radiation prescription was entered and confirmed.   The patient will receive 50.4 Gy in 28 fractions to the high-dose target region.  Daily image guidance is ordered, and this will be used on a daily basis. This is necessary to ensure accurate and precise localization of the target in addition to accurate alignment of the normal tissue structures in this region. This is particularly important given the possible motion of the high-dose target.  Treatment planning then occurred.   I have requested : Intensity Modulated Radiotherapy (IMRT) is medically necessary for this case for the following reason: Dose homogeneity; the target is in close proximity to critical normal structures, including the femoral heads, bladder, and small bowel. IMRT is thus medically to appropriately treat the patient.   Special treatment procedure  The patient will receive chemotherapy during the course of radiation treatment. The patient may experience increased or overlapping toxicity due to this combined-modality approach and the patient will  be monitored for such problems. This may include extra lab work as necessary. This therefore constitutes a special treatment procedure.     ________________________________  Radene Gunning, MD, PhD

## 2013-05-21 ENCOUNTER — Ambulatory Visit: Payer: BC Managed Care – PPO | Admitting: Physical Therapy

## 2013-05-22 ENCOUNTER — Other Ambulatory Visit: Payer: BC Managed Care – PPO | Admitting: Lab

## 2013-05-22 ENCOUNTER — Encounter: Payer: Self-pay | Admitting: Radiation Oncology

## 2013-05-22 ENCOUNTER — Encounter: Payer: Self-pay | Admitting: Oncology

## 2013-05-22 ENCOUNTER — Telehealth (INDEPENDENT_AMBULATORY_CARE_PROVIDER_SITE_OTHER): Payer: Self-pay | Admitting: General Surgery

## 2013-05-22 ENCOUNTER — Ambulatory Visit: Payer: BC Managed Care – PPO | Admitting: Physical Therapy

## 2013-05-22 NOTE — Telephone Encounter (Signed)
Pt called to ask for reassurance she is taking chemo and radiation for good reason.  She has recently had a staged CT/ PET scan, so given permission by Dr. Maurine Minister nurse to share PET scan results: Confirmed anal cancer and no evidence of mets.  She was quite relieved and states she will continue the chemo gladly.

## 2013-05-23 ENCOUNTER — Telehealth: Payer: Self-pay | Admitting: *Deleted

## 2013-05-23 NOTE — Telephone Encounter (Signed)
Called patient per Dr.Moody," per Pet scan, is consistent nothing else to biopsy and to start treatment on Monday ", she stated "she had gotten PET scan results from Dr.thomas's office and is ready to roll with this radiation treatment on Monday and to kick its Butt", thanked [place] for the call  9:25 AM

## 2013-05-24 ENCOUNTER — Other Ambulatory Visit: Payer: Self-pay | Admitting: *Deleted

## 2013-05-27 ENCOUNTER — Ambulatory Visit (HOSPITAL_BASED_OUTPATIENT_CLINIC_OR_DEPARTMENT_OTHER): Payer: BC Managed Care – PPO

## 2013-05-27 ENCOUNTER — Ambulatory Visit: Payer: BC Managed Care – PPO | Admitting: Nutrition

## 2013-05-27 ENCOUNTER — Ambulatory Visit (HOSPITAL_COMMUNITY)
Admission: RE | Admit: 2013-05-27 | Discharge: 2013-05-27 | Disposition: A | Discharge status: Home / Self Care | Payer: BC Managed Care – PPO | Source: Ambulatory Visit | Attending: Oncology | Admitting: Oncology

## 2013-05-27 ENCOUNTER — Other Ambulatory Visit: Payer: Self-pay | Admitting: Oncology

## 2013-05-27 ENCOUNTER — Ambulatory Visit
Admission: RE | Admit: 2013-05-27 | Discharge: 2013-05-27 | Disposition: A | Discharge status: Home / Self Care | Payer: BC Managed Care – PPO | Source: Ambulatory Visit | Attending: Radiation Oncology | Admitting: Radiation Oncology

## 2013-05-27 ENCOUNTER — Other Ambulatory Visit: Payer: BC Managed Care – PPO | Admitting: Lab

## 2013-05-27 VITALS — BP 98/72 | HR 97 | Temp 97.8°F

## 2013-05-27 DIAGNOSIS — C210 Malignant neoplasm of anus, unspecified: Secondary | ICD-10-CM | POA: Insufficient documentation

## 2013-05-27 DIAGNOSIS — Z5111 Encounter for antineoplastic chemotherapy: Secondary | ICD-10-CM

## 2013-05-27 MED ORDER — ONDANSETRON 8 MG/50ML IVPB (CHCC)
8.0000 mg | Freq: Once | INTRAVENOUS | Status: AC
  Administered 2013-05-27: 8 mg via INTRAVENOUS

## 2013-05-27 MED ORDER — ONDANSETRON 8 MG/NS 50 ML IVPB
INTRAVENOUS | Status: AC
  Filled 2013-05-27: qty 8

## 2013-05-27 MED ORDER — DEXAMETHASONE SODIUM PHOSPHATE 10 MG/ML IJ SOLN
10.0000 mg | Freq: Once | INTRAMUSCULAR | Status: AC
  Administered 2013-05-27: 10 mg via INTRAVENOUS

## 2013-05-27 MED ORDER — MITOMYCIN CHEMO IV INJECTION 20 MG
10.0000 mg/m2 | Freq: Once | INTRAVENOUS | Status: AC
  Administered 2013-05-27: 16 mg via INTRAVENOUS
  Filled 2013-05-27: qty 32

## 2013-05-27 MED ORDER — LIDOCAINE HCL 1 % IJ SOLN
INTRAMUSCULAR | Status: AC
  Filled 2013-05-27: qty 20

## 2013-05-27 MED ORDER — SODIUM CHLORIDE 0.9 % IV SOLN
1000.0000 mg/m2/d | INTRAVENOUS | Status: DC
  Administered 2013-05-27: 6500 mg via INTRAVENOUS
  Filled 2013-05-27: qty 130

## 2013-05-27 MED ORDER — SODIUM CHLORIDE 0.9 % IV SOLN
Freq: Once | INTRAVENOUS | Status: AC
  Administered 2013-05-27: 15:00:00 via INTRAVENOUS

## 2013-05-27 MED ORDER — DEXAMETHASONE SODIUM PHOSPHATE 10 MG/ML IJ SOLN
INTRAMUSCULAR | Status: AC
  Filled 2013-05-27: qty 1

## 2013-05-27 NOTE — Progress Notes (Signed)
Patient education done, sitz bath and radiation book given to patient, discusses skin irritation, pain, diarrhea,(imodium ad prn) fatigue, bladder changes,  diet,increase protein and water,hydration, use baby wipes,  Water bottle to spray bottom when becomes irritated, for sitz bath only use warm water,showed how to use, teach back given, to see MD weekly/prn, can call for any concerns questions,gave my business card,printed new schedule calander 11:36 AM

## 2013-05-27 NOTE — Progress Notes (Signed)
This is a 57 year old female diagnosed with anal cancer to receive concurrent chemoradiation therapy.  Past medical history includes GERD.  Medications include Prilosec, oxycodone, Valium, Percocet, and Compazine.  Labs were reviewed.  Height: 60 inches. Weight: 137.3 pounds on October 14. Usual body weight between 125-135 pounds. BMI: 26.81.  Patient is receiving her first chemotherapy today.  She reports no issues currently with oral intake.  She eating normally.  She states she typically loses approximately 10 pounds over the winter.  This is because she stops drinking beer in the winter.  Patient typically consumes a smoothie in the morning.  She does not eat a lot throughout the day but does have a regular dinner.  Nutrition diagnosis: Food and nutrition related knowledge deficit related to new diagnosis of anal cancer and associated treatments as evidenced by no prior need for nutrition related information.  Intervention: Patient was educated to consume smaller amounts of food more often to promote weight stability.  Protein foods were encouraged and recommended.  Patient was educated on a low fiber diet if she develops diarrhea.  Fact sheet was provided for reference.  Contact information was given.  Teach back method used.  Monitoring, evaluation, goals:  Patient will tolerate adequate calories and protein to minimize weight loss throughout treatment.  Next visit: Patient agrees to contact me with questions or concerns.

## 2013-05-27 NOTE — Progress Notes (Signed)
Great blood return via PICC. Blood return checked very 5 mls during mitomycin push. Discussed the take home chemo spill kit with pt and her husband. All questions answered. We discussed the infusion pump and how to call for help after closing hours.

## 2013-05-27 NOTE — Patient Instructions (Signed)
[facility] Discharge Instructions for Patients Receiving Chemotherapy  Today you received the following chemotherapy agents mitomycin and fluorouracil continuous infusion.  To help prevent nausea and vomiting after your treatment, we encourage you to take your nausea medication, compazine every 6 hours as needed for nausea.   If you develop nausea and vomiting that is not controlled by your nausea medication, call the clinic.   BELOW ARE SYMPTOMS THAT SHOULD BE REPORTED IMMEDIATELY:  *FEVER GREATER THAN 100.5 F  *CHILLS WITH OR WITHOUT FEVER  NAUSEA AND VOMITING THAT IS NOT CONTROLLED WITH YOUR NAUSEA MEDICATION  *UNUSUAL SHORTNESS OF BREATH  *UNUSUAL BRUISING OR BLEEDING  TENDERNESS IN MOUTH AND THROAT WITH OR WITHOUT PRESENCE OF ULCERS  *URINARY PROBLEMS  *BOWEL PROBLEMS  UNUSUAL RASH Items with * indicate a potential emergency and should be followed up as soon as possible.  Feel free to call the clinic you have any questions or concerns. The clinic phone number is [phone].

## 2013-05-27 NOTE — Procedures (Signed)
Placement of right arm PICC.  Tip in SVC and ready to use.

## 2013-05-28 ENCOUNTER — Ambulatory Visit (HOSPITAL_BASED_OUTPATIENT_CLINIC_OR_DEPARTMENT_OTHER): Payer: BC Managed Care – PPO

## 2013-05-28 ENCOUNTER — Ambulatory Visit
Admission: RE | Admit: 2013-05-28 | Discharge: 2013-05-28 | Disposition: A | Discharge status: Home / Self Care | Payer: BC Managed Care – PPO | Source: Ambulatory Visit | Attending: Radiation Oncology | Admitting: Radiation Oncology

## 2013-05-28 DIAGNOSIS — C801 Malignant (primary) neoplasm, unspecified: Secondary | ICD-10-CM

## 2013-05-28 DIAGNOSIS — C210 Malignant neoplasm of anus, unspecified: Secondary | ICD-10-CM

## 2013-05-28 MED ORDER — HEPARIN SOD (PORK) LOCK FLUSH 100 UNIT/ML IV SOLN
500.0000 [IU] | Freq: Once | INTRAVENOUS | Status: DC
  Filled 2013-05-28: qty 5

## 2013-05-28 MED ORDER — SODIUM CHLORIDE 0.9 % IJ SOLN
10.0000 mL | INTRAMUSCULAR | Status: DC | PRN
  Filled 2013-05-28: qty 10

## 2013-05-28 NOTE — Patient Instructions (Signed)
Peripherally Inserted Central Catheter (PICC) Home Guide A peripherally inserted central catheter (PICC) is a long, thin, flexible tube that is inserted into a vein in the upper arm. It is a form of intravenous (IV) access. It is considered to be a "central" line because the tip of the PICC ends in a large vein in your chest. This large vein is called the superior vena cava (SVC). The PICC tip ends in the SVC because there is a lot of blood flow in the SVC. This allows medicines and IV fluids to be quickly distributed throughout the body. The PICC is inserted using a sterile technique by a specially trained nurse or physician. After the PICC is inserted, a chest X-ray is done to be sure it is in the correct place.  A PICC may be placed for different reasons, such as:  To give medicines and liquid nutrition that can only be given through a central line. Examples are:  Certain antibiotic treatments.  Chemotherapy.  Total parenteral nutrition (TPN).  To take frequent blood samples.  To give IV fluids and blood products.  If there is difficulty placing a peripheral intravenous (PIV) catheter. If taken care of properly, a PICC can remain in place for several months. A PICC can also allow patients to go home early. Medicine and PICC care can be managed at home by a family member or home healthcare team. RISKS AND COMPLICATIONS Possible problems with a PICC can occasionally occur. This may include:  A clot (thrombus) forming in or at the tip of the PICC. This can cause the PICC to become clogged. A "clot-busting" medicine called tissue plasminogen activator (tPA) can be inserted into the PICC to help break up the clot.  Inflammation of the vein (phlebitis) in which the PICC is placed. Signs of inflammation may include redness, pain at the insertion site, red streaks, or being able to feel a "cord" in the vein where the PICC is located.  Infection in the PICC or at the insertion site. Signs of  infection may include fever, chills, redness, swelling, or pus drainage from the PICC insertion site.  PICC movement (malposition). The PICC tip may migrate from its original position due to excessive physical activity, forceful coughing, sneezing, or vomiting.  A break or cut in the PICC. It is important to not use scissors near the PICC.  Nerve or tendon irritation or injury during PICC insertion. HOME CARE INSTRUCTIONS Activity  You may bend your arm and move it freely. If your PICC is near or at the bend of your elbow, avoid activity with repeated motion at the elbow.  Avoid lifting heavy objects as instructed by your caregiver.  Avoid using a crutch with the arm on the same side as your PICC. You may need to use a walker. PICC Dressing  Keep your PICC bandage (dressing) clean and dry to prevent infection.  Ask your caregiver when you may shower. Ask your caregiver to teach you how to wrap the PICC when you do take a shower.  Do not bathe, swim, or use hot tubs when you have a PICC.  Change the PICC dressing as instructed by your caregiver.  Change your PICC dressing if it becomes loose or wet. General PICC Care  Check the PICC insertion site daily for leakage, redness, swelling, or pain.  Flush the PICC as directed by your caregiver. Let your caregiver know right away if the PICC is difficult to flush or does not flush. Do not use force   to flush the PICC.  Do not use a syringe that is less than 10 mLs to flush the PICC.  Never pull or tug on the PICC.  Avoid blood pressure checks on the arm with the PICC.  Keep your PICC identification card with you at all times.  Do not take the PICC out yourself. Only a trained clinical professional should remove the PICC. SEEK IMMEDIATE MEDICAL CARE IF:  Your PICC is accidently pulled all the way out. If this happens, cover the insertion site with a bandage or gauze dressing. Do not throw the PICC away. Your caregiver will need to  inspect it.  Your PICC was tugged or pulled and has partially come out. Do not  push the PICC back in.  There is any type of drainage, redness, or swelling where the PICC enters the skin.  You cannot flush the PICC, it is difficult to flush, or the PICC leaks around the insertion site when it is flushed.  You hear a "flushing" sound when the PICC is flushed.  You have pain, discomfort, or numbness in your arm, shoulder, or jaw on the same side as the PICC .  You feel your heart "racing" or skipping beats.  You notice a hole or tear in the PICC.  You develop chills or a fever. MAKE SURE YOU:   Understand these instructions.  Will watch your condition.  Will get help right away if you are not doing well or get worse. Document Released: 01/22/2003 Document Revised: 10/10/2011 Document Reviewed: 11/22/2010 Exit[employer] Patient Information 2014 [employer].  

## 2013-05-29 ENCOUNTER — Ambulatory Visit
Admission: RE | Admit: 2013-05-29 | Discharge: 2013-05-29 | Disposition: A | Discharge status: Home / Self Care | Payer: BC Managed Care – PPO | Source: Ambulatory Visit | Attending: Radiation Oncology | Admitting: Radiation Oncology

## 2013-05-30 ENCOUNTER — Ambulatory Visit
Admission: RE | Admit: 2013-05-30 | Discharge: 2013-05-30 | Disposition: A | Discharge status: Home / Self Care | Payer: BC Managed Care – PPO | Source: Ambulatory Visit | Attending: Radiation Oncology | Admitting: Radiation Oncology

## 2013-05-30 ENCOUNTER — Encounter: Payer: Self-pay | Admitting: *Deleted

## 2013-05-30 ENCOUNTER — Encounter: Payer: Self-pay | Admitting: Radiation Oncology

## 2013-05-30 VITALS — BP 103/75 | HR 90 | Temp 98.4°F | Resp 20

## 2013-05-30 DIAGNOSIS — C210 Malignant neoplasm of anus, unspecified: Secondary | ICD-10-CM

## 2013-05-30 NOTE — Progress Notes (Unsigned)
[facility] Psychosocial Distress Screening Clinical Social Work  Clinical Social Work was referred by distress screening protocol.  The patient scored a 5 on the Psychosocial Distress Thermometer which indicates moderate distress. Clinical Social Worker met with Pt on 10/14./14 to assess for distress and other psychosocial needs.  CSW provided support on 05/14/13. No other needs noted currently.    Clinical Social Worker follow up needed: no  If yes, follow up plan:   Doreen Salvage, LCSW Clinical Social Worker [facility] Wednesday, Thursday and Friday Phone: [phone] Fax: [phone]

## 2013-05-30 NOTE — Progress Notes (Signed)
Emily Bible has had 4 rad txs anal/pelvis has c/o pain requesting crema already,informed MD usually doesn't give cream to that area because of moistness, will have MD see Compton 10:55 AM ient

## 2013-05-30 NOTE — Progress Notes (Signed)
MD to see Tomorrow after discussing with him his schedule is full at present unless patient wants to wait, she will come back tomorrow, suggested to try her sitz bath as well 11:11 AM

## 2013-05-30 NOTE — Progress Notes (Signed)
    Emily Compton has had 4 rad txs anal/pelvis has c/o pain requesting crema already,informed MD usually doesn't give cream to that area because of moistness, will have MD see patient, checked patient's bottom, reddened between cheeks, no skin breakdown, she is using baby wipes, and spray bottle to cleanse area after bm's, had bm today almost constipated stated patient, has 5FU infusing  Also, no c/o nausea 10:55 AM

## 2013-05-31 ENCOUNTER — Ambulatory Visit (HOSPITAL_BASED_OUTPATIENT_CLINIC_OR_DEPARTMENT_OTHER): Payer: BC Managed Care – PPO

## 2013-05-31 ENCOUNTER — Encounter: Payer: Self-pay | Admitting: Radiation Oncology

## 2013-05-31 ENCOUNTER — Ambulatory Visit
Admission: RE | Admit: 2013-05-31 | Discharge: 2013-05-31 | Disposition: A | Discharge status: Home / Self Care | Payer: BC Managed Care – PPO | Source: Ambulatory Visit | Attending: Radiation Oncology | Admitting: Radiation Oncology

## 2013-05-31 VITALS — BP 102/69 | HR 86

## 2013-05-31 VITALS — Wt 133.5 lb

## 2013-05-31 DIAGNOSIS — C210 Malignant neoplasm of anus, unspecified: Secondary | ICD-10-CM

## 2013-05-31 MED ORDER — LIDOCAINE HCL 2 % EX GEL: CUTANEOUS | Status: DC | PRN

## 2013-05-31 MED ORDER — SODIUM CHLORIDE 0.9 % IJ SOLN
10.0000 mL | INTRAMUSCULAR | Status: DC | PRN
  Filled 2013-05-31: qty 10

## 2013-05-31 NOTE — Progress Notes (Signed)
Pt in for 5FU pump disconnect. Reports Dr. Truett Perna stated she would have PICC removed with pump DC. No order noted in chart. Pt states she does not want to keep PICC line for next treatment in 4 weeks. Reviewed with Dr. Cyndie Chime, OK to DC PICC today.

## 2013-05-31 NOTE — Progress Notes (Signed)
   Department of Radiation Oncology  Phone:  [phone] Fax:        [phone]  Weekly Treatment Note    Name: Emily Compton Date: 05/31/2013 MRN: [id number] DOB: 1956-06-02   Current dose: 9 Gy  Current fraction: 5   MEDICATIONS: Current Outpatient Prescriptions  Medication Sig Dispense Refill  . diazepam (VALIUM) 10 MG tablet Take 1 tablet (10 mg total) by mouth every 12 (twelve) hours as needed.  60 tablet  3  . emollient (BIAFINE) cream Apply topically as needed.      Marland Kitchen omeprazole (PRILOSEC OTC) 20 MG tablet Take 20 mg by mouth daily. Take one tablet as needed for acid reflux       . oxyCODONE-acetaminophen (PERCOCET) 10-325 MG per tablet Take 1 tablet by mouth every 4 (four) hours as needed for pain.      Marland Kitchen prochlorperazine (COMPAZINE) 10 MG tablet Take 1 tablet (10 mg total) by mouth every 6 (six) hours as needed (nausea).  60 tablet  1   No current facility-administered medications for this encounter.     ALLERGIES: Adhesive   LABORATORY DATA:  Lab Results  Component Value Date   WBC 5.8 05/20/2013   HGB 13.8 05/20/2013   HCT 41.8 05/20/2013   MCV 86.9 05/20/2013   PLT 239 05/20/2013   Lab Results  Component Value Date   NA 142 05/20/2013   K 4.8 05/20/2013   CL 104 02/25/2013   CO2 28 05/20/2013   Lab Results  Component Value Date   ALT 11 05/20/2013   AST 19 05/20/2013   ALKPHOS 63 05/20/2013   BILITOT 0.32 05/20/2013     NARRATIVE: Emily Compton was seen today for weekly treatment management. The chart was checked and the patient's films were reviewed. The patient has noticed some redness/irritation thus far. She is interested in some skin cream. She is using baby wipes. No nausea. No complaints of mucositis currently.  PHYSICAL EXAMINATION: weight is 133 lb 8 oz (60.555 kg).        ASSESSMENT: The patient is doing satisfactorily with treatment.  PLAN: We will continue with the patient's radiation treatment as planned. I've  given a prescription for some cream which she can begin using. We discussed the normal timeframe of using this but this will be available if she feels that she needs this to her.

## 2013-05-31 NOTE — Progress Notes (Signed)
PICC removed from right upper arm per protocol. Vaseline guaze and pressure dressing applied. Instructed pt to leave dressing on for 24 hours and to call if any problems with bleeding or signs of infection. Pt voiced understanding.

## 2013-05-31 NOTE — Progress Notes (Signed)
Assessment done yesterday, MD unable to see then, will  See MD today,no changes

## 2013-05-31 NOTE — Progress Notes (Signed)
bolused 2.7 mL

## 2013-05-31 NOTE — Patient Instructions (Signed)
Peripherally Inserted Central Catheter (PICC) Removal and Care After A peripherally inserted catheter (PICC) is removed when it is no longer needed, when it is clotted, or when it may be infected.  PROCEDURE  The removal of a PICC is usually painless. Removing the tape that holds the PICC in place may be the most discomfort you have.  A physicians order needs to be obtained to have the PICC removed.  A PICC can be removed in the hospital or in an outpatient setting.  Never remove or take out the PICC yourself. Only a trained clinical professional, such as a PICC nurse, should remove the PICC.  If a PICC is suspected to be infected, the PICC tip is sent to the lab for culture. HOME CARE INSTRUCTIONS  When the PICC is out, pressure is applied at the insertion site to prevent bleeding. An antibiotic ointment may be applied to the insertion site. A dry, sterile gauze is then taped over the insertion site. This dressing should stay on for 24 hours.  After the 24 hours is up, the dressing may be removed. The PICC insertion site is very small. A small scab may develop over the insertion site. It is okay to wash the site gently with soap and water. Be careful to not remove or pick the scab off. After washing, gently pat the site dry. You do not need to put another dressing over the insertion site after you wash it.  Avoid heavy, strenuous physical activity for 24 hours after the PICC is removed. This includes things like:  Weight lifting.  Strenuous yard work.  Any physical activity with repetitive arm movement. SEEK MEDICAL CARE IF:  Call or see your caregiver as soon as possible if you develop the following conditions in the arm in which the PICC was inserted:  Swelling or puffiness.  Increasing tenderness or pain. SEEK IMMEDIATE MEDICAL CARE IF:  You develop any of the following conditions in the arm that had the PICC:  Numbness or tingling in your fingers, hand, or arm.  You arm has  a bluish color and it is cold to the touch.  Redness around the insertion site or a red-streak that goes up your arm.  Any type of drainage from the PICC insertion site. This includes drainage such as:  Bleeding from the insertion site. (If this happens, apply firm, direct pressure to the PICC insertion site with a clean towel.)  Drainage that is yellow or tan in color.  You have an oral temperature above 102 F (38.9 C), not controlled by medicine. Document Released: 01/05/2010 Document Revised: 10/10/2011 Document Reviewed: 01/05/2010 [employer] Patient Information 2014 [employer].  

## 2013-06-03 ENCOUNTER — Ambulatory Visit
Admission: RE | Admit: 2013-06-03 | Discharge: 2013-06-03 | Disposition: A | Discharge status: Home / Self Care | Payer: BC Managed Care – PPO | Source: Ambulatory Visit | Attending: Radiation Oncology | Admitting: Radiation Oncology

## 2013-06-03 ENCOUNTER — Ambulatory Visit: Payer: BC Managed Care – PPO | Admitting: Physical Therapy

## 2013-06-04 ENCOUNTER — Ambulatory Visit
Admission: RE | Admit: 2013-06-04 | Discharge: 2013-06-04 | Disposition: A | Discharge status: Home / Self Care | Payer: BC Managed Care – PPO | Source: Ambulatory Visit | Attending: Radiation Oncology | Admitting: Radiation Oncology

## 2013-06-05 ENCOUNTER — Ambulatory Visit
Admission: RE | Admit: 2013-06-05 | Discharge: 2013-06-05 | Disposition: A | Discharge status: Home / Self Care | Payer: BC Managed Care – PPO | Source: Ambulatory Visit | Attending: Radiation Oncology | Admitting: Radiation Oncology

## 2013-06-05 ENCOUNTER — Encounter: Payer: Self-pay | Admitting: Radiation Oncology

## 2013-06-05 ENCOUNTER — Ambulatory Visit: Payer: BC Managed Care – PPO | Attending: Oncology | Admitting: Physical Therapy

## 2013-06-05 VITALS — BP 111/66 | HR 81 | Temp 98.1°F | Resp 20 | Wt 133.3 lb

## 2013-06-05 DIAGNOSIS — C210 Malignant neoplasm of anus, unspecified: Secondary | ICD-10-CM

## 2013-06-05 DIAGNOSIS — IMO0002 Reserved for concepts with insufficient information to code with codable children: Secondary | ICD-10-CM | POA: Insufficient documentation

## 2013-06-05 DIAGNOSIS — M62838 Other muscle spasm: Secondary | ICD-10-CM | POA: Insufficient documentation

## 2013-06-05 DIAGNOSIS — IMO0001 Reserved for inherently not codable concepts without codable children: Secondary | ICD-10-CM | POA: Insufficient documentation

## 2013-06-05 MED ORDER — SILVER SULFADIAZINE 1 % EX CREA
TOPICAL_CREAM | Freq: Once | CUTANEOUS | Status: AC
  Administered 2013-06-05: 13:00:00 via TOPICAL

## 2013-06-05 NOTE — Progress Notes (Signed)
Weekly rad txs rectum has 7 completed so far, hasn't picked up her lidocaine cream rx yet,[facility] didn't have the other day, spouse to pick up today, patient c/o mucositis in mouth, ulcers from the chemotherapy, completed 75fu last Friday, and c/o pain in buttocks, raw and broken skin, moistness, hasn't used sitz bath or biafine cream either, does use biotene rinses, and biotene toothpaste, wants rx for mouth ulcers and lip blisters, not eating  Because of this 11:26 AM

## 2013-06-05 NOTE — Progress Notes (Signed)
  Radiation Oncology         [phone] ________________________________  Name: Emily Compton MRN: [id number]  Date: 06/05/2013  DOB: 09-Dec-1955  Weekly Radiation Therapy Management  Current Dose: 12.6 Gy     Planned Dose:  50.4 Gy  Narrative . . . . . . . . The patient presents for routine under treatment assessment.                                   The patient notes perianal discomfort.                                 Set-up films were reviewed.                                 The chart was checked. Physical Findings. . .  weight is 133 lb 4.8 oz (60.464 kg). Her oral temperature is 98.1 F (36.7 C). Her blood pressure is 111/66 and her pulse is 81. Her respiration is 20. . Weight essentially stable. She does have patchy dry desquamation of the gluteal crease after 7 fractions or radiation with no superinfection. Impression . . . . . . . The patient is tolerating radiation.  She seems to be experiencing dermatitis very early in her course suggesting sensitivity to radiation vs. Other etiologies Plan . . . . . . . . . . . . Continue treatment as planned.  Advised Sitz baths prn BM and started silvadene early.  ________________________________  Artist Pais Kathrynn Running, M.D.

## 2013-06-05 NOTE — Progress Notes (Signed)
Silvadene cram given to patient with instructions of use, apply thin layer to affected area after rad txs daily and in am if needed, just not 4 hours prior to rad txs, must remove cream with soap and water gently before re applying cream,teach back given

## 2013-06-06 ENCOUNTER — Telehealth: Payer: Self-pay | Admitting: *Deleted

## 2013-06-06 ENCOUNTER — Ambulatory Visit
Admission: RE | Admit: 2013-06-06 | Discharge: 2013-06-06 | Disposition: A | Discharge status: Home / Self Care | Payer: BC Managed Care – PPO | Source: Ambulatory Visit | Attending: Radiation Oncology | Admitting: Radiation Oncology

## 2013-06-06 ENCOUNTER — Ambulatory Visit (HOSPITAL_BASED_OUTPATIENT_CLINIC_OR_DEPARTMENT_OTHER): Payer: BC Managed Care – PPO | Admitting: Nurse Practitioner

## 2013-06-06 ENCOUNTER — Other Ambulatory Visit (HOSPITAL_BASED_OUTPATIENT_CLINIC_OR_DEPARTMENT_OTHER): Payer: BC Managed Care – PPO | Admitting: Lab

## 2013-06-06 ENCOUNTER — Other Ambulatory Visit: Payer: Self-pay

## 2013-06-06 ENCOUNTER — Encounter: Payer: Self-pay | Admitting: Radiation Oncology

## 2013-06-06 ENCOUNTER — Telehealth: Payer: Self-pay | Admitting: Oncology

## 2013-06-06 VITALS — BP 119/75 | HR 84 | Temp 98.7°F | Resp 18 | Ht 60.0 in | Wt 131.4 lb

## 2013-06-06 DIAGNOSIS — C210 Malignant neoplasm of anus, unspecified: Secondary | ICD-10-CM

## 2013-06-06 DIAGNOSIS — K625 Hemorrhage of anus and rectum: Secondary | ICD-10-CM

## 2013-06-06 DIAGNOSIS — K130 Diseases of lips: Secondary | ICD-10-CM

## 2013-06-06 LAB — CBC WITH DIFFERENTIAL/PLATELET
BASO%: 0.2 % (ref 0.0–2.0)
Basophils Absolute: 0 10*3/uL (ref 0.0–0.1)
EOS%: 4.1 % (ref 0.0–7.0)
HCT: 36.5 % (ref 34.8–46.6)
HGB: 12.3 g/dL (ref 11.6–15.9)
MCH: 28.6 pg (ref 25.1–34.0)
MCHC: 33.6 g/dL (ref 31.5–36.0)
MCV: 84.9 fL (ref 79.5–101.0)
MONO#: 0.2 10*3/uL (ref 0.1–0.9)
MONO%: 6.9 % (ref 0.0–14.0)
NEUT%: 68.1 % (ref 38.4–76.8)
RBC: 4.3 10*6/uL (ref 3.70–5.45)
RDW: 12.9 % (ref 11.2–14.5)

## 2013-06-06 MED ORDER — MAGIC MOUTHWASH: 5.0000 mL | Freq: Three times a day (TID) | ORAL | Status: DC | PRN

## 2013-06-06 NOTE — Telephone Encounter (Signed)
Per staff message and POF I have scheduled appts.  JMW  

## 2013-06-06 NOTE — Telephone Encounter (Signed)
Gave pt appt for lab, Md and chemo for 07/01/13

## 2013-06-06 NOTE — Progress Notes (Addendum)
OFFICE PROGRESS NOTE  Interval history:  Emily Compton returns for followup of anal cancer. She began radiation on 06/27/2013. She completed cycle 1 5-FU/mitomycin C. beginning 05/27/2013.  She developed sores on the lower lip on day 8. The buccal regions are "sore" without discrete ulcerations. She denies nausea/vomiting. She had a loose stool yesterday. No further rectal bleeding. No skin rash. She is tolerating fluids without difficulty. She complains of rectal pain and ulcerations.   Objective: Blood pressure 119/75, pulse 84, temperature 98.7 F (37.1 C), temperature source Oral, resp. rate 18, height 5' (1.524 m), weight 131 lb 6.4 oz (59.603 kg).  Tiny ulcerations along the lower lip. Posterior buccal regions with focal areas of erythema, no discrete ulcerations. Lungs are clear. No wheezes or rales. Regular cardiac rhythm. Abdomen soft and nontender. No organomegaly. Extremities without edema. Lower labia, perineum, perianal and gluteal fold regions with erythema and superficial ulcerations.  Lab Results: Lab Results  Component Value Date   WBC 2.5* 06/06/2013   HGB 12.3 06/06/2013   HCT 36.5 06/06/2013   MCV 84.9 06/06/2013   PLT 124* 06/06/2013  ANC 1.7  Chemistry:    Chemistry      Component Value Date/Time   NA 142 05/20/2013 1159   NA 139 02/25/2013 0919   K 4.8 05/20/2013 1159   K 4.0 02/25/2013 0919   CL 104 02/25/2013 0919   CO2 28 05/20/2013 1159   CO2 27 02/25/2013 0919   BUN 12.3 05/20/2013 1159   BUN 15 02/25/2013 0919   CREATININE 0.8 05/20/2013 1159   CREATININE 0.8 02/25/2013 0919      Component Value Date/Time   CALCIUM 9.8 05/20/2013 1159   CALCIUM 9.4 02/25/2013 0919   ALKPHOS 63 05/20/2013 1159   ALKPHOS 57 02/25/2013 0919   AST 19 05/20/2013 1159   AST 22 02/25/2013 0919   ALT 11 05/20/2013 1159   ALT 14 02/25/2013 0919   BILITOT 0.32 05/20/2013 1159   BILITOT 0.5 02/25/2013 0919       Studies/Results: Nm Pet Image Initial (pi) Skull Base To  Thigh  05/20/2013   CLINICAL DATA:  Initial treatment strategy for anal carcinoma.  EXAM: NUCLEAR MEDICINE PET SKULL BASE TO THIGH  FASTING BLOOD GLUCOSE:  Value:  84 mg/dl  TECHNIQUE: 86.57 mCi Q-46 FDG was injected intravenously. CT data was obtained and used for attenuation correction and anatomic localization only. (This was not acquired as a diagnostic CT examination.) Additional exam technical data entered on technologist worksheet.  COMPARISON:  CT on 05/06/2013  FINDINGS: NECK  No hypermetabolic lymph nodes in the neck.  CHEST  No hypermetabolic mediastinal or hilar nodes. No suspicious pulmonary nodules on the CT scan.  ABDOMEN/PELVIS  No abnormal hypermetabolic activity within the liver, pancreas, adrenal glands, or spleen. No hypermetabolic lymph nodes in the abdomen or pelvis. A hypermetabolic focus is seen in the inguinal canal which has a maximum SUV of 8.5, and corresponds with known history of primary anal carcinoma.  SKELETON  No focal hypermetabolic activity to suggest skeletal metastasis.  IMPRESSION: Solitary hypermetabolic focus in anal canal, consistent with known primary anal carcinoma.  No evidence of local or distant metastatic disease.   Electronically Signed   By: Myles Rosenthal M.D.   On: 05/20/2013 16:48   Ir Fluoro Guide Cv Line Right  05/27/2013   CLINICAL DATA:  57 year old with anal cancer.  EXAM: POWERED PICC LINE PLACEMENT WITH ULTRASOUND AND FLUOROSCOPIC GUIDANCE  FLUOROSCOPY TIME:  18 seconds  PROCEDURE: The  patient was advised of the possible risks and complications and agreed to undergo the procedure. The patient was then brought to the angiographic suite for the procedure.  The right arm was prepped with chlorhexidine, draped in the usual sterile fashion using maximum barrier technique (cap and mask, sterile gown, sterile gloves, large sterile sheet, hand hygiene and cutaneous antisepsis) and infiltrated locally with 1% Lidocaine.  Ultrasound demonstrated patency of the  right basilic vein, and this was documented with an image. Under real-time ultrasound guidance, this vein was accessed with a 21 gauge micropuncture needle and image documentation was performed. A 0.018 wire was introduced in to the vein. Over this, a 5 [place] dual lumen power injectable PICC was advanced to the lower SVC. PICC line length equals 36 cm. Fluoroscopy during the procedure and fluoro spot radiograph confirms appropriate catheter position. The catheter was flushed and covered with sterile dressing.  Complications: None  IMPRESSION: Successful right arm Power PICC line placement with ultrasound and fluoroscopic guidance. The catheter is ready for use.   Electronically Signed   By: Richarda Overlie M.D.   On: 05/27/2013 13:47   Ir US Guide Vasc Access Right  05/27/2013   CLINICAL DATA:  57 year old with anal cancer.  EXAM: POWERED PICC LINE PLACEMENT WITH ULTRASOUND AND FLUOROSCOPIC GUIDANCE  FLUOROSCOPY TIME:  18 seconds  PROCEDURE: The patient was advised of the possible risks and complications and agreed to undergo the procedure. The patient was then brought to the angiographic suite for the procedure.  The right arm was prepped with chlorhexidine, draped in the usual sterile fashion using maximum barrier technique (cap and mask, sterile gown, sterile gloves, large sterile sheet, hand hygiene and cutaneous antisepsis) and infiltrated locally with 1% Lidocaine.  Ultrasound demonstrated patency of the right basilic vein, and this was documented with an image. Under real-time ultrasound guidance, this vein was accessed with a 21 gauge micropuncture needle and image documentation was performed. A 0.018 wire was introduced in to the vein. Over this, a 5 [place] dual lumen power injectable PICC was advanced to the lower SVC. PICC line length equals 36 cm. Fluoroscopy during the procedure and fluoro spot radiograph confirms appropriate catheter position. The catheter was flushed and covered with sterile dressing.   Complications: None  IMPRESSION: Successful right arm Power PICC line placement with ultrasound and fluoroscopic guidance. The catheter is ready for use.   Electronically Signed   By: Richarda Overlie M.D.   On: 05/27/2013 13:47    Medications: I have reviewed the patient's current medications.  Assessment/Plan:  1. Squamous cell carcinoma of the anus, clinical stage II (T2 N0); staging PET scan 05/20/2013 with a solitary hypermetabolic focus in the anal canal and no evidence of local or distant metastatic disease. Radiation initiated 05/27/2013. Cycle 1 5-FU/mitomycin C. beginning 05/27/2013. 2. History of hemorrhoids. 3. Rectal bleeding secondary to #1. Improved. 4. Oral mucositis. 5. Skin toxicity at the labia/perineum/perianal region related to radiation and chemotherapy.  Disposition-she completed cycle 1 5-FU/mitomycin C. beginning 05/27/2013. She has mucositis mainly involving the lower lip. We gave her a prescription for Magic mouthwash to use if she develops ulcers within the oral cavity. She is tolerating fluids without difficulty.  She has skin toxicity at the labia/perineum/perianal region. She is applying cream as directed by radiation oncology.  She will return for a followup visit on 07/01/2013 prior to beginning cycle 2 5-FU/mitomycin C. We will refer her for PICC line placement prior to treatment that day.   She  understands to contact the office with progressive mouth sores or difficulty maintaining adequate oral hydration.  Patient seen with Dr. Truett Perna.  30 minutes were spent face-to-face at today's visit with the majority of that time involved in counseling/coordination of care.  Lonna Cobb ANP/GNP-BC  This was a shared visit with Lonna Cobb.  Ms. Bealer has developed mucositis secondary chemotherapy. She has skin breakdown at the perineum. She will use barrier cream as recommended by radiation oncology. She will contact us if the mouth/lip soreness does not improve.  We will see her prior to the second cycle of chemotherapy.  Mancel Bale, M.D.

## 2013-06-07 ENCOUNTER — Ambulatory Visit
Admission: RE | Admit: 2013-06-07 | Discharge: 2013-06-07 | Disposition: A | Discharge status: Home / Self Care | Payer: BC Managed Care – PPO | Source: Ambulatory Visit | Attending: Radiation Oncology | Admitting: Radiation Oncology

## 2013-06-10 ENCOUNTER — Ambulatory Visit
Admission: RE | Admit: 2013-06-10 | Discharge: 2013-06-10 | Disposition: A | Discharge status: Home / Self Care | Payer: BC Managed Care – PPO | Source: Ambulatory Visit | Attending: Radiation Oncology | Admitting: Radiation Oncology

## 2013-06-11 ENCOUNTER — Ambulatory Visit
Admission: RE | Admit: 2013-06-11 | Discharge: 2013-06-11 | Disposition: A | Discharge status: Home / Self Care | Payer: BC Managed Care – PPO | Source: Ambulatory Visit | Attending: Radiation Oncology | Admitting: Radiation Oncology

## 2013-06-12 ENCOUNTER — Ambulatory Visit
Admission: RE | Admit: 2013-06-12 | Discharge: 2013-06-12 | Disposition: A | Discharge status: Home / Self Care | Payer: BC Managed Care – PPO | Source: Ambulatory Visit | Attending: Radiation Oncology | Admitting: Radiation Oncology

## 2013-06-13 ENCOUNTER — Telehealth (INDEPENDENT_AMBULATORY_CARE_PROVIDER_SITE_OTHER): Payer: Self-pay | Admitting: General Surgery

## 2013-06-13 ENCOUNTER — Ambulatory Visit
Admission: RE | Admit: 2013-06-13 | Discharge: 2013-06-13 | Disposition: A | Discharge status: Home / Self Care | Payer: BC Managed Care – PPO | Source: Ambulatory Visit | Attending: Radiation Oncology | Admitting: Radiation Oncology

## 2013-06-13 NOTE — Telephone Encounter (Signed)
Patient was seen on 04/30/2013 by Dr Maisie Fus. She has anal cancer and is currently undergoing radiation therapy. She is calling today because she has bleeding and painful areas on her bottom. She believes these are hemorrhoids. She states they are bulging and painful. She is not sure if anything can be done due to radiation but she would feel better if Dr Maisie Fus looked at the area. Since it is painful and bleeding, I offered appt in our urgent office for evaluation, but she would like to wait for Dr Maisie Fus. Appt made next Wednesday 06/19/2013.

## 2013-06-14 ENCOUNTER — Ambulatory Visit
Admission: RE | Admit: 2013-06-14 | Discharge: 2013-06-14 | Disposition: A | Discharge status: Home / Self Care | Payer: BC Managed Care – PPO | Source: Ambulatory Visit | Attending: Radiation Oncology | Admitting: Radiation Oncology

## 2013-06-14 VITALS — BP 95/63 | HR 94 | Temp 98.6°F | Wt 130.4 lb

## 2013-06-14 DIAGNOSIS — C210 Malignant neoplasm of anus, unspecified: Secondary | ICD-10-CM

## 2013-06-14 MED ORDER — OXYCODONE-ACETAMINOPHEN 10-325 MG PO TABS: 1.0000 | ORAL_TABLET | ORAL | Status: DC | PRN

## 2013-06-14 NOTE — Progress Notes (Signed)
Patient for weekly assessment of radiation to anus.Has external swollen tissue outside rectum that are painful.Patient has been doing bath soaks twice daily.Has sitz bath but difficult to manipulate.Applyies silvadee 3 to 4 times daily.Has appointment to see Katrina Stack on Wednesday 06/19/13 to assess.

## 2013-06-14 NOTE — Progress Notes (Signed)
   Department of Radiation Oncology  Phone:  [phone] Fax:        [phone]  Weekly Treatment Note    Name: Emily Compton Date: 06/14/2013 MRN: [id number] DOB: November 22, 1955   Current dose: 27 Gy  Current fraction: 15   MEDICATIONS: Current Outpatient Prescriptions  Medication Sig Dispense Refill  . Alum & Mag Hydroxide-Simeth (MAGIC MOUTHWASH) SOLN Take 5 mLs by mouth 3 (three) times daily as needed for mouth pain.  240 mL  2  . diazepam (VALIUM) 10 MG tablet Take 1 tablet (10 mg total) by mouth every 12 (twelve) hours as needed.  60 tablet  3  . docusate sodium (COLACE) 100 MG capsule Take 100 mg by mouth 2 (two) times daily.      Marland Kitchen emollient (BIAFINE) cream Apply topically as needed.      . lactobacillus acidophilus (BACID) TABS tablet Take 2 tablets by mouth 3 (three) times daily.      Marland Kitchen lidocaine (XYLOCAINE JELLY) 2 % jelly Apply topically as needed.  30 mL  2  . omeprazole (PRILOSEC OTC) 20 MG tablet Take 20 mg by mouth daily. Take one tablet as needed for acid reflux       . oxyCODONE-acetaminophen (PERCOCET) 10-325 MG per tablet Take 1 tablet by mouth every 4 (four) hours as needed for pain.  30 tablet  0  . prochlorperazine (COMPAZINE) 10 MG tablet Take 1 tablet (10 mg total) by mouth every 6 (six) hours as needed (nausea).  60 tablet  1  . silver sulfADIAZINE (SILVADENE) 1 % cream Apply 1 application topically 2 (two) times daily. Apply to affected area after rad tx daily and prn,not 4 hours prior to rad txs       No current facility-administered medications for this encounter.     ALLERGIES: Adhesive   LABORATORY DATA:  Lab Results  Component Value Date   WBC 2.5* 06/06/2013   HGB 12.3 06/06/2013   HCT 36.5 06/06/2013   MCV 84.9 06/06/2013   PLT 124* 06/06/2013   Lab Results  Component Value Date   NA 142 05/20/2013   K 4.8 05/20/2013   CL 104 02/25/2013   CO2 28 05/20/2013   Lab Results  Component Value Date   ALT 11 05/20/2013   AST 19  05/20/2013   ALKPHOS 63 05/20/2013   BILITOT 0.32 05/20/2013     NARRATIVE: Emily Compton was seen today for weekly treatment management. The chart was checked and the patient's films were reviewed. The patient is doing reasonably well with treatment. She is having some soreness in the anal region. She has had a problem with hemorrhoids.  PHYSICAL EXAMINATION: weight is 130 lb 6.4 oz (59.149 kg). Her temperature is 98.6 F (37 C). Her blood pressure is 95/63 and her pulse is 94.      inflamed external hemorrhoids present with some radiation change in the anal region. Some early desquamation emerging in the anal region and gluteal cleft.  ASSESSMENT: The patient is doing satisfactorily with treatment. The patient will continue sitz baths and using Silvadene which she likes. I also discussed with her using the Xylocaine cream in an alternating fashion which I also think will help with pain in the anal region.  PLAN: We will continue with the patient's radiation treatment as planned.

## 2013-06-17 ENCOUNTER — Ambulatory Visit
Admission: RE | Admit: 2013-06-17 | Discharge: 2013-06-17 | Disposition: A | Discharge status: Home / Self Care | Payer: BC Managed Care – PPO | Source: Ambulatory Visit | Attending: Radiation Oncology | Admitting: Radiation Oncology

## 2013-06-17 DIAGNOSIS — C210 Malignant neoplasm of anus, unspecified: Secondary | ICD-10-CM

## 2013-06-17 MED ORDER — SILVER SULFADIAZINE 1 % EX CREA
TOPICAL_CREAM | Freq: Once | CUTANEOUS | Status: AC
  Administered 2013-06-17: 1 via TOPICAL

## 2013-06-18 ENCOUNTER — Ambulatory Visit
Admission: RE | Admit: 2013-06-18 | Discharge: 2013-06-18 | Disposition: A | Discharge status: Home / Self Care | Payer: BC Managed Care – PPO | Source: Ambulatory Visit | Attending: Radiation Oncology | Admitting: Radiation Oncology

## 2013-06-19 ENCOUNTER — Ambulatory Visit
Admission: RE | Admit: 2013-06-19 | Discharge: 2013-06-19 | Disposition: A | Discharge status: Home / Self Care | Payer: BC Managed Care – PPO | Source: Ambulatory Visit | Attending: Radiation Oncology | Admitting: Radiation Oncology

## 2013-06-19 ENCOUNTER — Ambulatory Visit (INDEPENDENT_AMBULATORY_CARE_PROVIDER_SITE_OTHER): Payer: BC Managed Care – PPO | Admitting: General Surgery

## 2013-06-19 ENCOUNTER — Encounter (INDEPENDENT_AMBULATORY_CARE_PROVIDER_SITE_OTHER): Payer: Self-pay | Admitting: General Surgery

## 2013-06-19 VITALS — BP 100/74 | HR 84 | Resp 16 | Ht 60.0 in | Wt 128.8 lb

## 2013-06-19 DIAGNOSIS — C210 Malignant neoplasm of anus, unspecified: Secondary | ICD-10-CM

## 2013-06-19 NOTE — Patient Instructions (Signed)
Continue current treatment.  Make an apt to see me 4-6 weeks after treatment is complete

## 2013-06-19 NOTE — Progress Notes (Signed)
Emily Compton is a 57 y.o. female who is here for a follow up visit regarding her anal cancer treatment.  She began radiation on 06/27/2013. She completed cycle 1 5-FU/mitomycin C. beginning 05/27/2013.  She is here today with complaints of anal pain and swelling.   Objective: Filed Vitals:   06/19/13 1540  BP: 100/74  Pulse: 84  Resp: 16    General appearance: alert and cooperative GI: normal findings: soft, non-tender Anal: small inflamed hemorrhoid, R posterior   Assessment and Plan: Continue baths and silvadene.  No further therapy indicated at this time.  Return to my office 4-6 weeks after completion of treatment.     Vanita Panda, MD [facility], [place] [phone]

## 2013-06-20 ENCOUNTER — Ambulatory Visit
Admission: RE | Admit: 2013-06-20 | Discharge: 2013-06-20 | Disposition: A | Discharge status: Home / Self Care | Payer: BC Managed Care – PPO | Source: Ambulatory Visit | Attending: Radiation Oncology | Admitting: Radiation Oncology

## 2013-06-21 ENCOUNTER — Ambulatory Visit
Admission: RE | Admit: 2013-06-21 | Discharge: 2013-06-21 | Disposition: A | Discharge status: Home / Self Care | Payer: BC Managed Care – PPO | Source: Ambulatory Visit | Attending: Radiation Oncology | Admitting: Radiation Oncology

## 2013-06-21 ENCOUNTER — Encounter: Payer: Self-pay | Admitting: Radiation Oncology

## 2013-06-21 VITALS — BP 127/64 | HR 94 | Temp 98.2°F | Resp 20 | Wt 129.8 lb

## 2013-06-21 DIAGNOSIS — C210 Malignant neoplasm of anus, unspecified: Secondary | ICD-10-CM

## 2013-06-21 NOTE — Progress Notes (Signed)
Weekly rad txs, 20/28 anal, silvadene is helping  A lot and pain has stabilized, just c/o hemorrhoids,  Eating and drinking well, regular bowel movements, takes 3 baths a day helping 12:18 PM

## 2013-06-21 NOTE — Progress Notes (Signed)
   Department of Radiation Oncology  Phone:  [phone] Fax:        [phone]  Weekly Treatment Note    Name: Emily Compton Date: 06/21/2013 MRN: [id number] DOB: 11/07/1955   Current dose: 36 Gy  Current fraction:20   MEDICATIONS: Current Outpatient Prescriptions  Medication Sig Dispense Refill  . Alum & Mag Hydroxide-Simeth (MAGIC MOUTHWASH) SOLN Take 5 mLs by mouth 3 (three) times daily as needed for mouth pain.  240 mL  2  . diazepam (VALIUM) 10 MG tablet Take 1 tablet (10 mg total) by mouth every 12 (twelve) hours as needed.  60 tablet  3  . docusate sodium (COLACE) 100 MG capsule Take 100 mg by mouth 2 (two) times daily.      Marland Kitchen emollient (BIAFINE) cream Apply topically as needed.      . lactobacillus acidophilus (BACID) TABS tablet Take 2 tablets by mouth 3 (three) times daily.      Marland Kitchen lidocaine (XYLOCAINE JELLY) 2 % jelly Apply topically as needed.  30 mL  2  . omeprazole (PRILOSEC OTC) 20 MG tablet Take 20 mg by mouth daily. Take one tablet as needed for acid reflux       . oxyCODONE-acetaminophen (PERCOCET) 10-325 MG per tablet Take 1 tablet by mouth every 4 (four) hours as needed for pain.  30 tablet  0  . prochlorperazine (COMPAZINE) 10 MG tablet Take 1 tablet (10 mg total) by mouth every 6 (six) hours as needed (nausea).  60 tablet  1  . silver sulfADIAZINE (SILVADENE) 1 % cream Apply 1 application topically 2 (two) times daily. Apply to affected area after rad tx daily and prn,not 4 hours prior to rad txs       No current facility-administered medications for this encounter.     ALLERGIES: Adhesive   LABORATORY DATA:  Lab Results  Component Value Date   WBC 2.5* 06/06/2013   HGB 12.3 06/06/2013   HCT 36.5 06/06/2013   MCV 84.9 06/06/2013   PLT 124* 06/06/2013   Lab Results  Component Value Date   NA 142 05/20/2013   K 4.8 05/20/2013   CL 104 02/25/2013   CO2 28 05/20/2013   Lab Results  Component Value Date   ALT 11 05/20/2013   AST 19  05/20/2013   ALKPHOS 63 05/20/2013   BILITOT 0.32 05/20/2013     NARRATIVE: Emily Compton was seen today for weekly treatment management. The chart was checked and the patient's films were reviewed. The patient states that she is doing well. She is using Silvadene cream which she believes has helped quite a bit. Regular bowel movements. Good appetite.  PHYSICAL EXAMINATION: weight is 129 lb 12.8 oz (58.877 kg). Her oral temperature is 98.2 F (36.8 C). Her blood pressure is 127/64 and her pulse is 94. Her respiration is 20.        ASSESSMENT: The patient is doing satisfactorily with treatment.  PLAN: We will continue with the patient's radiation treatment as planned.

## 2013-06-24 ENCOUNTER — Ambulatory Visit
Admission: RE | Admit: 2013-06-24 | Discharge: 2013-06-24 | Disposition: A | Discharge status: Home / Self Care | Payer: BC Managed Care – PPO | Source: Ambulatory Visit | Attending: Radiation Oncology | Admitting: Radiation Oncology

## 2013-06-24 ENCOUNTER — Encounter: Payer: Self-pay | Admitting: Radiation Oncology

## 2013-06-24 VITALS — BP 108/74 | HR 84 | Resp 16 | Wt 129.5 lb

## 2013-06-24 DIAGNOSIS — C210 Malignant neoplasm of anus, unspecified: Secondary | ICD-10-CM

## 2013-06-24 NOTE — Progress Notes (Signed)
Reports SSD cream has made a positive difference in her skin. She explains the sores in her perineal area are healing now and not getting worse. Denies diarrhea. Reports taking dulcolax once per day and probiotics. Reports that she has increased her water intake. Reports she resumes chemotherapy next weeks.

## 2013-06-24 NOTE — Progress Notes (Signed)
   Department of Radiation Oncology  Phone:  [phone] Fax:        [phone]  Weekly Treatment Note    Name: Emily Compton Date: 06/24/2013 MRN: [id number] DOB: [birth year]   Current dose: 37.8 Gy  Current fraction: 21   MEDICATIONS: Current Outpatient Prescriptions  Medication Sig Dispense Refill  . Alum & Mag Hydroxide-Simeth (MAGIC MOUTHWASH) SOLN Take 5 mLs by mouth 3 (three) times daily as needed for mouth pain.  240 mL  2  . diazepam (VALIUM) 10 MG tablet Take 1 tablet (10 mg total) by mouth every 12 (twelve) hours as needed.  60 tablet  3  . docusate sodium (COLACE) 100 MG capsule Take 100 mg by mouth 2 (two) times daily.      Marland Kitchen emollient (BIAFINE) cream Apply topically as needed.      . lactobacillus acidophilus (BACID) TABS tablet Take 2 tablets by mouth 3 (three) times daily.      Marland Kitchen lidocaine (XYLOCAINE JELLY) 2 % jelly Apply topically as needed.  30 mL  2  . omeprazole (PRILOSEC OTC) 20 MG tablet Take 20 mg by mouth daily. Take one tablet as needed for acid reflux       . oxyCODONE-acetaminophen (PERCOCET) 10-325 MG per tablet Take 1 tablet by mouth every 4 (four) hours as needed for pain.  30 tablet  0  . prochlorperazine (COMPAZINE) 10 MG tablet Take 1 tablet (10 mg total) by mouth every 6 (six) hours as needed (nausea).  60 tablet  1  . silver sulfADIAZINE (SILVADENE) 1 % cream Apply 1 application topically 2 (two) times daily. Apply to affected area after rad tx daily and prn,not 4 hours prior to rad txs       No current facility-administered medications for this encounter.     ALLERGIES: Adhesive   LABORATORY DATA:  Lab Results  Component Value Date   WBC 2.5* 06/06/2013   HGB 12.3 06/06/2013   HCT 36.5 06/06/2013   MCV 84.9 06/06/2013   PLT 124* 06/06/2013   Lab Results  Component Value Date   NA 142 05/20/2013   K 4.8 05/20/2013   CL 104 02/25/2013   CO2 28 05/20/2013   Lab Results  Component Value Date   ALT 11 05/20/2013   AST 19  05/20/2013   ALKPHOS 63 05/20/2013   BILITOT 0.32 05/20/2013     NARRATIVE: Emily Compton was seen today for weekly treatment management. The chart was checked and the patient's films were reviewed. The patient states that she is doing very well. Sitz baths have really helped in addition to her skin brain. She feels that really her skin has slightly improved over the last week.  PHYSICAL EXAMINATION: weight is 129 lb 8 oz (58.741 kg). Her blood pressure is 108/74 and her pulse is 84. Her respiration is 16.        ASSESSMENT: The patient is doing satisfactorily with treatment.  PLAN: We will continue with the patient's radiation treatment as planned.

## 2013-06-25 ENCOUNTER — Ambulatory Visit
Admission: RE | Admit: 2013-06-25 | Discharge: 2013-06-25 | Disposition: A | Discharge status: Home / Self Care | Payer: BC Managed Care – PPO | Source: Ambulatory Visit | Attending: Radiation Oncology | Admitting: Radiation Oncology

## 2013-06-26 ENCOUNTER — Ambulatory Visit
Admission: RE | Admit: 2013-06-26 | Discharge: 2013-06-26 | Disposition: A | Discharge status: Home / Self Care | Payer: BC Managed Care – PPO | Source: Ambulatory Visit | Attending: Radiation Oncology | Admitting: Radiation Oncology

## 2013-07-01 ENCOUNTER — Ambulatory Visit (HOSPITAL_BASED_OUTPATIENT_CLINIC_OR_DEPARTMENT_OTHER): Payer: BC Managed Care – PPO

## 2013-07-01 ENCOUNTER — Encounter: Payer: Self-pay | Admitting: *Deleted

## 2013-07-01 ENCOUNTER — Telehealth: Payer: Self-pay | Admitting: Oncology

## 2013-07-01 ENCOUNTER — Ambulatory Visit (HOSPITAL_BASED_OUTPATIENT_CLINIC_OR_DEPARTMENT_OTHER): Payer: BC Managed Care – PPO | Admitting: Oncology

## 2013-07-01 ENCOUNTER — Other Ambulatory Visit: Payer: Self-pay | Admitting: Nurse Practitioner

## 2013-07-01 ENCOUNTER — Ambulatory Visit
Admission: RE | Admit: 2013-07-01 | Discharge: 2013-07-01 | Disposition: A | Discharge status: Home / Self Care | Payer: BC Managed Care – PPO | Source: Ambulatory Visit | Attending: Radiation Oncology | Admitting: Radiation Oncology

## 2013-07-01 ENCOUNTER — Ambulatory Visit (HOSPITAL_COMMUNITY)
Admission: RE | Admit: 2013-07-01 | Discharge: 2013-07-01 | Disposition: A | Discharge status: Home / Self Care | Payer: BC Managed Care – PPO | Source: Ambulatory Visit | Attending: Nurse Practitioner | Admitting: Nurse Practitioner

## 2013-07-01 ENCOUNTER — Other Ambulatory Visit (HOSPITAL_BASED_OUTPATIENT_CLINIC_OR_DEPARTMENT_OTHER): Payer: BC Managed Care – PPO | Admitting: Lab

## 2013-07-01 VITALS — BP 119/74 | HR 78 | Temp 97.6°F | Resp 20 | Ht 60.0 in | Wt 126.2 lb

## 2013-07-01 DIAGNOSIS — L539 Erythematous condition, unspecified: Secondary | ICD-10-CM

## 2013-07-01 DIAGNOSIS — K121 Other forms of stomatitis: Secondary | ICD-10-CM

## 2013-07-01 DIAGNOSIS — C210 Malignant neoplasm of anus, unspecified: Secondary | ICD-10-CM

## 2013-07-01 DIAGNOSIS — Z5111 Encounter for antineoplastic chemotherapy: Secondary | ICD-10-CM

## 2013-07-01 LAB — CBC WITH DIFFERENTIAL/PLATELET
BASO%: 0.4 % (ref 0.0–2.0)
EOS%: 6.5 % (ref 0.0–7.0)
Eosinophils Absolute: 0.2 10*3/uL (ref 0.0–0.5)
HCT: 36.2 % (ref 34.8–46.6)
HGB: 12.2 g/dL (ref 11.6–15.9)
MCH: 29.3 pg (ref 25.1–34.0)
MCHC: 33.6 g/dL (ref 31.5–36.0)
MCV: 87.2 fL (ref 79.5–101.0)
NEUT%: 67.4 % (ref 38.4–76.8)
lymph#: 0.2 10*3/uL — ABNORMAL LOW (ref 0.9–3.3)

## 2013-07-01 LAB — COMPREHENSIVE METABOLIC PANEL (CC13)
AST: 32 U/L (ref 5–34)
Anion Gap: 11 mEq/L (ref 3–11)
BUN: 6.8 mg/dL — ABNORMAL LOW (ref 7.0–26.0)
CO2: 25 mEq/L (ref 22–29)
Calcium: 9.3 mg/dL (ref 8.4–10.4)
Chloride: 105 mEq/L (ref 98–109)
Creatinine: 0.7 mg/dL (ref 0.6–1.1)
Glucose: 93 mg/dl (ref 70–140)
Potassium: 4.1 mEq/L (ref 3.5–5.1)
Total Bilirubin: 0.34 mg/dL (ref 0.20–1.20)

## 2013-07-01 MED ORDER — DEXAMETHASONE SODIUM PHOSPHATE 10 MG/ML IJ SOLN
INTRAMUSCULAR | Status: AC
  Filled 2013-07-01: qty 1

## 2013-07-01 MED ORDER — MITOMYCIN CHEMO IV INJECTION 20 MG
8.0000 mg/m2 | Freq: Once | INTRAVENOUS | Status: AC
  Administered 2013-07-01: 13 mg via INTRAVENOUS
  Filled 2013-07-01: qty 26

## 2013-07-01 MED ORDER — SODIUM CHLORIDE 0.9 % IV SOLN
Freq: Once | INTRAVENOUS | Status: AC
  Administered 2013-07-01: 12:00:00 via INTRAVENOUS

## 2013-07-01 MED ORDER — SILVER SULFADIAZINE 1 % EX CREA
TOPICAL_CREAM | Freq: Every day | CUTANEOUS | Status: DC
  Administered 2013-07-01: 14:00:00 via TOPICAL

## 2013-07-01 MED ORDER — SODIUM CHLORIDE 0.9 % IV SOLN
800.0000 mg/m2/d | INTRAVENOUS | Status: DC
  Administered 2013-07-01: 5200 mg via INTRAVENOUS
  Filled 2013-07-01: qty 104

## 2013-07-01 MED ORDER — SODIUM CHLORIDE 0.9 % IJ SOLN
10.0000 mL | INTRAMUSCULAR | Status: DC | PRN
  Administered 2013-07-01: 10 mL
  Filled 2013-07-01: qty 10

## 2013-07-01 MED ORDER — DEXAMETHASONE SODIUM PHOSPHATE 10 MG/ML IJ SOLN
10.0000 mg | Freq: Once | INTRAMUSCULAR | Status: AC
  Administered 2013-07-01: 10 mg via INTRAVENOUS

## 2013-07-01 MED ORDER — ONDANSETRON 8 MG/NS 50 ML IVPB
INTRAVENOUS | Status: AC
  Filled 2013-07-01: qty 8

## 2013-07-01 MED ORDER — ONDANSETRON 8 MG/50ML IVPB (CHCC)
8.0000 mg | Freq: Once | INTRAVENOUS | Status: AC
  Administered 2013-07-01: 8 mg via INTRAVENOUS

## 2013-07-01 MED ORDER — HEPARIN SOD (PORK) LOCK FLUSH 100 UNIT/ML IV SOLN
250.0000 [IU] | Freq: Once | INTRAVENOUS | Status: AC | PRN
  Administered 2013-07-01: 250 [IU]
  Filled 2013-07-01: qty 5

## 2013-07-01 NOTE — Progress Notes (Signed)
Patient came by nursing requesting more silvadene cream, stated it is working  But almost out, gave 2 jars 50gm silvadene to pateint 2:18 PM

## 2013-07-01 NOTE — Progress Notes (Signed)
   [facility]    OFFICE PROGRESS NOTE   INTERVAL HISTORY:   She returns for scheduled followup of anal cancer. A right PICC was placed earlier today. She continues daily radiation. The skin erythema and breakdown have improved with Silvadene cream. The lip ulcers have resolved.  Objective:  Vital signs in last 24 hours:  Blood pressure 119/74, pulse 78, temperature 97.6 F (36.4 C), temperature source Oral, resp. rate 20, height 5' (1.524 m), weight 126 lb 3.2 oz (57.244 kg).    HEENT: No thrush or ulcers Resp: Lungs clear bilaterally Cardio: Regular rate and rhythm GI: No hepatomegaly, nontender Vascular: No leg edema  Skin: Erythema at the groin, labia, and perineum. Superficial skin breakdown at the lower gluteal fold and near the posterior vagina/labia  PICC-with a gauze dressing  Lab Results:  Lab Results  Component Value Date   WBC 2.7* 07/01/2013   HGB 12.2 07/01/2013   HCT 36.2 07/01/2013   MCV 87.2 07/01/2013   PLT 256 07/01/2013   ANC 1.8    Medications: I have reviewed the patient's current medications.  Assessment/Plan: 1. Squamous cell carcinoma of the anus, clinical stage II (T2 N0); staging PET scan 05/20/2013 with a solitary hypermetabolic focus in the anal canal and no evidence of local or distant metastatic disease. Radiation initiated 05/27/2013. Cycle 1 5-FU/mitomycin C. beginning 05/27/2013. 2. History of hemorrhoids. 3. Rectal bleeding secondary to #1. Improved. 4. Oral mucositis. 5. Skin toxicity at the labia/perineum/perianal region related to radiation and chemotherapy.  Disposition:  She appears stable to proceed with the second cycle of 5-FU/mitomycin-C. She is concerned about the chance of developing recurrent mucositis and perineum skin breakdown following chemotherapy. We decided to dose reduce the 5-fluorouracil and mitomycin C. with this cycle. The PICC will be removed with the pump disconnect on 07/05/2013.  Ms.  Leath will return for an office visit and nadir CBC on 07/11/2013.   Thornton Papas, MD  07/01/2013  6:01 PM

## 2013-07-01 NOTE — Patient Instructions (Signed)
[facility] Discharge Instructions for Patients Receiving Chemotherapy  Today you received the following chemotherapy agents mitomycin and a adrucil pump.  To help prevent nausea and vomiting after your treatment, we encourage you to take your nausea medication compazine.   If you develop nausea and vomiting that is not controlled by your nausea medication, call the clinic.   BELOW ARE SYMPTOMS THAT SHOULD BE REPORTED IMMEDIATELY:  *FEVER GREATER THAN 100.5 F  *CHILLS WITH OR WITHOUT FEVER  NAUSEA AND VOMITING THAT IS NOT CONTROLLED WITH YOUR NAUSEA MEDICATION  *UNUSUAL SHORTNESS OF BREATH  *UNUSUAL BRUISING OR BLEEDING  TENDERNESS IN MOUTH AND THROAT WITH OR WITHOUT PRESENCE OF ULCERS  *URINARY PROBLEMS  *BOWEL PROBLEMS  UNUSUAL RASH Items with * indicate a potential emergency and should be followed up as soon as possible.  Feel free to call the clinic you have any questions or concerns. The clinic phone number is [phone].

## 2013-07-01 NOTE — Procedures (Signed)
Placement of right arm PICC.  Tip in lower SVC.  Ready to use.

## 2013-07-01 NOTE — Progress Notes (Signed)
Chaplain made initial visit. Pt described her journey through many weeks of radiation and two rounds of chemo. Pt stated that she had to take her bag home for a 96 hour drip, and that this is the second time she's had to do that. However, at the end of this week, she will be done. Pt expressed gratitude for that, but also concern about the week after her treatment, as she expects it to be very difficult. Her physicians have told her this will be the hardest point. Chaplain reflected that pt must have extraordinary resilience to make it through so much. Pt said that she could use a prayer for more of this. Chaplain prayed for strength as she completes her treatment, for a positive outcome, and for support from family and friends. Pt expressed thanks for prayer.

## 2013-07-01 NOTE — Telephone Encounter (Signed)
gv pt appt schedule for december.  °

## 2013-07-01 NOTE — Progress Notes (Signed)
VO Burnard Bunting RN from Dr. Mancel Bale.  OK to treat today despite WBC  Of 2.7.

## 2013-07-02 ENCOUNTER — Ambulatory Visit
Admission: RE | Admit: 2013-07-02 | Discharge: 2013-07-02 | Disposition: A | Discharge status: Home / Self Care | Payer: BC Managed Care – PPO | Source: Ambulatory Visit | Attending: Radiation Oncology | Admitting: Radiation Oncology

## 2013-07-02 ENCOUNTER — Ambulatory Visit: Payer: BC Managed Care – PPO

## 2013-07-02 DIAGNOSIS — Z452 Encounter for adjustment and management of vascular access device: Secondary | ICD-10-CM

## 2013-07-02 MED ORDER — HEPARIN SOD (PORK) LOCK FLUSH 100 UNIT/ML IV SOLN
500.0000 [IU] | Freq: Once | INTRAVENOUS | Status: DC
  Filled 2013-07-02: qty 5

## 2013-07-02 MED ORDER — SODIUM CHLORIDE 0.9 % IJ SOLN
10.0000 mL | INTRAMUSCULAR | Status: DC | PRN
  Filled 2013-07-02: qty 10

## 2013-07-02 NOTE — Progress Notes (Signed)
Dressing change and flush done on 07/01/13. Offered to flush PICC line, Patient refused stating, "They just changed and flushed it yesterday."

## 2013-07-03 ENCOUNTER — Ambulatory Visit
Admission: RE | Admit: 2013-07-03 | Discharge: 2013-07-03 | Disposition: A | Discharge status: Home / Self Care | Payer: BC Managed Care – PPO | Source: Ambulatory Visit | Attending: Radiation Oncology | Admitting: Radiation Oncology

## 2013-07-04 ENCOUNTER — Ambulatory Visit
Admission: RE | Admit: 2013-07-04 | Discharge: 2013-07-04 | Disposition: A | Discharge status: Home / Self Care | Payer: BC Managed Care – PPO | Source: Ambulatory Visit | Attending: Radiation Oncology | Admitting: Radiation Oncology

## 2013-07-04 ENCOUNTER — Encounter: Payer: Self-pay | Admitting: Oncology

## 2013-07-04 ENCOUNTER — Encounter: Payer: Self-pay | Admitting: Radiation Oncology

## 2013-07-04 VITALS — BP 103/72 | HR 108 | Temp 98.3°F | Resp 20 | Wt 125.8 lb

## 2013-07-04 DIAGNOSIS — C210 Malignant neoplasm of anus, unspecified: Secondary | ICD-10-CM

## 2013-07-04 NOTE — Progress Notes (Signed)
   Department of Radiation Oncology  Phone:  [phone] Fax:        [phone]  Weekly Treatment Note    Name: Emily Compton Date: 07/04/2013 MRN: [id number] DOB: 07-01-1956   Current dose: 48.6 Gy  Current fraction: 27   MEDICATIONS: Current Outpatient Prescriptions  Medication Sig Dispense Refill  . Alum & Mag Hydroxide-Simeth (MAGIC MOUTHWASH) SOLN Take 5 mLs by mouth 3 (three) times daily as needed for mouth pain.  240 mL  2  . bisacodyl (DULCOLAX) 5 MG EC tablet Take 5 mg by mouth daily as needed for moderate constipation.      . diazepam (VALIUM) 10 MG tablet Take 1 tablet (10 mg total) by mouth every 12 (twelve) hours as needed.  60 tablet  3  . lactobacillus acidophilus (BACID) TABS tablet Take 2 tablets by mouth 3 (three) times daily.      Marland Kitchen lidocaine (XYLOCAINE JELLY) 2 % jelly Apply topically as needed.  30 mL  2  . omeprazole (PRILOSEC OTC) 20 MG tablet Take 20 mg by mouth daily. Take one tablet as needed for acid reflux       . oxyCODONE-acetaminophen (PERCOCET) 10-325 MG per tablet Take 1 tablet by mouth every 4 (four) hours as needed for pain.  30 tablet  0  . prochlorperazine (COMPAZINE) 10 MG tablet Take 1 tablet (10 mg total) by mouth every 6 (six) hours as needed (nausea).  60 tablet  1  . silver sulfADIAZINE (SILVADENE) 1 % cream Apply 1 application topically 2 (two) times daily. Apply to affected area after rad tx daily and prn,not 4 hours prior to rad txs       No current facility-administered medications for this encounter.     ALLERGIES: Adhesive   LABORATORY DATA:  Lab Results  Component Value Date   WBC 2.7* 07/01/2013   HGB 12.2 07/01/2013   HCT 36.2 07/01/2013   MCV 87.2 07/01/2013   PLT 256 07/01/2013   Lab Results  Component Value Date   NA 140 07/01/2013   K 4.1 07/01/2013   CL 104 02/25/2013   CO2 25 07/01/2013   Lab Results  Component Value Date   ALT 37 07/01/2013   AST 32 07/01/2013   ALKPHOS 54 07/01/2013   BILITOT 0.34  07/01/2013     NARRATIVE: Emily Compton was seen today for weekly treatment management. The chart was checked and the patient's films were reviewed. The patient is briefly is that she is almost finished. She will finish her final fraction tomorrow. She relates no real change in her symptoms. Continued irritation but no significant worsening.  No diarrhea.  PHYSICAL EXAMINATION: weight is 125 lb 12.8 oz (57.063 kg). Her oral temperature is 98.3 F (36.8 C). Her blood pressure is 103/72 and her pulse is 108. Her respiration is 20.      the patient has radiation change in the treatment area with bright erythema and some desquamation.  ASSESSMENT: The patient is doing satisfactorily with treatment.  PLAN: We will continue with the patient's radiation treatment as planned. The patient will followup in our clinic in one month.

## 2013-07-04 NOTE — Progress Notes (Signed)
Weekly rad txs pelvis area, 27/28 bright erythema in front private area even on her thighs now, buttocks in between has scant open area,using silvadene bid, sitz baths, and flushable wipes, squirt bottle with water to cleanse area, regular bm's, no diarrhea, doing well 1 month f./u appt card given, has 5 fu infusing  In right forearm  picc line intact, completes chemotheraqpy tomorrow as well 11:58 AM

## 2013-07-05 ENCOUNTER — Other Ambulatory Visit: Payer: Self-pay | Admitting: *Deleted

## 2013-07-05 ENCOUNTER — Ambulatory Visit (HOSPITAL_BASED_OUTPATIENT_CLINIC_OR_DEPARTMENT_OTHER): Payer: BC Managed Care – PPO

## 2013-07-05 ENCOUNTER — Ambulatory Visit
Admission: RE | Admit: 2013-07-05 | Discharge: 2013-07-05 | Disposition: A | Discharge status: Home / Self Care | Payer: BC Managed Care – PPO | Source: Ambulatory Visit | Attending: Radiation Oncology | Admitting: Radiation Oncology

## 2013-07-05 ENCOUNTER — Encounter: Payer: Self-pay | Admitting: Radiation Oncology

## 2013-07-05 DIAGNOSIS — C210 Malignant neoplasm of anus, unspecified: Secondary | ICD-10-CM

## 2013-07-05 NOTE — Patient Instructions (Signed)
Peripherally Inserted Central Catheter (PICC) Removal and Care After A peripherally inserted catheter (PICC) is removed when it is no longer needed, when it is clotted, or when it may be infected.  PROCEDURE  The removal of a PICC is usually painless. Removing the tape that holds the PICC in place may be the most discomfort you have.  A physicians order needs to be obtained to have the PICC removed.  A PICC can be removed in the hospital or in an outpatient setting.  Never remove or take out the PICC yourself. Only a trained clinical professional, such as a PICC nurse, should remove the PICC.  If a PICC is suspected to be infected, the PICC tip is sent to the lab for culture. HOME CARE INSTRUCTIONS  When the PICC is out, pressure is applied at the insertion site to prevent bleeding. An antibiotic ointment may be applied to the insertion site. A dry, sterile gauze is then taped over the insertion site. This dressing should stay on for 24 hours.  After the 24 hours is up, the dressing may be removed. The PICC insertion site is very small. A small scab may develop over the insertion site. It is okay to wash the site gently with soap and water. Be careful to not remove or pick the scab off. After washing, gently pat the site dry. You do not need to put another dressing over the insertion site after you wash it.  Avoid heavy, strenuous physical activity for 24 hours after the PICC is removed. This includes things like:  Weight lifting.  Strenuous yard work.  Any physical activity with repetitive arm movement. SEEK MEDICAL CARE IF:  Call or see your caregiver as soon as possible if you develop the following conditions in the arm in which the PICC was inserted:  Swelling or puffiness.  Increasing tenderness or pain. SEEK IMMEDIATE MEDICAL CARE IF:  You develop any of the following conditions in the arm that had the PICC:  Numbness or tingling in your fingers, hand, or arm.  You arm has  a bluish color and it is cold to the touch.  Redness around the insertion site or a red-streak that goes up your arm.  Any type of drainage from the PICC insertion site. This includes drainage such as:  Bleeding from the insertion site. (If this happens, apply firm, direct pressure to the PICC insertion site with a clean towel.)  Drainage that is yellow or tan in color.  You have an oral temperature above 102 F (38.9 C), not controlled by medicine. Document Released: 01/05/2010 Document Revised: 10/10/2011 Document Reviewed: 01/05/2010 [employer] Patient Information 2014 [employer].  

## 2013-07-05 NOTE — Progress Notes (Signed)
Right PICC removed intact without problems.  Patient observed for 30 minutes post procedure.

## 2013-07-11 ENCOUNTER — Other Ambulatory Visit (HOSPITAL_BASED_OUTPATIENT_CLINIC_OR_DEPARTMENT_OTHER): Payer: BC Managed Care – PPO

## 2013-07-11 ENCOUNTER — Telehealth: Payer: Self-pay | Admitting: Oncology

## 2013-07-11 ENCOUNTER — Ambulatory Visit (HOSPITAL_BASED_OUTPATIENT_CLINIC_OR_DEPARTMENT_OTHER): Payer: BC Managed Care – PPO | Admitting: Nurse Practitioner

## 2013-07-11 ENCOUNTER — Encounter: Payer: Self-pay | Admitting: *Deleted

## 2013-07-11 VITALS — BP 96/68 | HR 100 | Temp 97.0°F | Resp 20 | Ht 60.0 in | Wt 120.5 lb

## 2013-07-11 DIAGNOSIS — L589 Radiodermatitis, unspecified: Secondary | ICD-10-CM

## 2013-07-11 DIAGNOSIS — K625 Hemorrhage of anus and rectum: Secondary | ICD-10-CM

## 2013-07-11 DIAGNOSIS — C210 Malignant neoplasm of anus, unspecified: Secondary | ICD-10-CM

## 2013-07-11 LAB — CBC WITH DIFFERENTIAL/PLATELET
Basophils Absolute: 0 10*3/uL (ref 0.0–0.1)
HCT: 34.5 % — ABNORMAL LOW (ref 34.8–46.6)
HGB: 11.9 g/dL (ref 11.6–15.9)
MCH: 29.5 pg (ref 25.1–34.0)
MCHC: 34.4 g/dL (ref 31.5–36.0)
MONO#: 0.1 10*3/uL (ref 0.1–0.9)
MONO%: 10.4 % (ref 0.0–14.0)
NEUT%: 78.4 % — ABNORMAL HIGH (ref 38.4–76.8)
RDW: 14.7 % — ABNORMAL HIGH (ref 11.2–14.5)
WBC: 1.3 10*3/uL — ABNORMAL LOW (ref 3.9–10.3)
lymph#: 0.1 10*3/uL — ABNORMAL LOW (ref 0.9–3.3)

## 2013-07-11 NOTE — Telephone Encounter (Signed)
Gave pt appt for lab and MD  for April 2015 °

## 2013-07-11 NOTE — Progress Notes (Signed)
OFFICE PROGRESS NOTE  Interval history:  Emily Compton returns for followup of anal cancer. She completed cycle 2 5-FU/mitomycin C. beginning 07/01/2013. She completed the course of radiation on 07/05/2013.  She denies nausea/vomiting area of mouth has been tender. No discrete ulcerations. Bowels moving regularly. She takes a stool softener. She denies diarrhea. She is taking Percocet for pain related to skin toxicity. She continues to note skin redness and breakdown at the labia/perineum/perianal region.   Objective: Blood pressure 96/68, pulse 100, temperature 97 F (36.1 C), temperature source Oral, resp. rate 20, height 5' (1.524 m), weight 120 lb 8 oz (54.658 kg).  No thrush or ulcerations. Mucous membranes are moist. Lungs are clear. Regular cardiac rhythm. Abdomen soft and nontender. No hepatomegaly. Erythema at the groin, labia and perineum. Superficial skin breakdown at the gluteal fold extending to the perineum.  Lab Results: Lab Results  Component Value Date   WBC 1.3* 07/11/2013   HGB 11.9 07/11/2013   HCT 34.5* 07/11/2013   MCV 85.7 07/11/2013   PLT 152 07/11/2013    Chemistry:    Chemistry      Component Value Date/Time   NA 140 07/01/2013 0948   NA 139 02/25/2013 0919   K 4.1 07/01/2013 0948   K 4.0 02/25/2013 0919   CL 104 02/25/2013 0919   CO2 25 07/01/2013 0948   CO2 27 02/25/2013 0919   BUN 6.8* 07/01/2013 0948   BUN 15 02/25/2013 0919   CREATININE 0.7 07/01/2013 0948   CREATININE 0.8 02/25/2013 0919      Component Value Date/Time   CALCIUM 9.3 07/01/2013 0948   CALCIUM 9.4 02/25/2013 0919   ALKPHOS 54 07/01/2013 0948   ALKPHOS 57 02/25/2013 0919   AST 32 07/01/2013 0948   AST 22 02/25/2013 0919   ALT 37 07/01/2013 0948   ALT 14 02/25/2013 0919   BILITOT 0.34 07/01/2013 0948   BILITOT 0.5 02/25/2013 0919       Studies/Results: Ir Fluoro Guide Cv Line Right  07/01/2013   CLINICAL DATA:  57 year old with anal cancer needs IV access for chemotherapy.  EXAM: POWER  INJECTABLE PICC LINE PLACEMENT WITH ULTRASOUND AND FLUOROSCOPIC GUIDANCE  FLUOROSCOPY TIME:  18 seconds  PROCEDURE: The patient was advised of the possible risks and complications and agreed to undergo the procedure. The patient was then brought to the angiographic suite for the procedure.  The right arm was prepped with chlorhexidine, draped in the usual sterile fashion using maximum barrier technique (cap and mask, sterile gown, sterile gloves, large sterile sheet, hand hygiene and cutaneous antisepsis) and infiltrated locally with 1% Lidocaine.  Ultrasound demonstrated patency of the right basilic vein, and this was documented with an image. Under real-time ultrasound guidance, this vein was accessed with a 21 gauge micropuncture needle and image documentation was performed. A 0.018 wire was introduced in to the vein. A peel-away sheath was placed. A 5 French dual lumen Power PICC was advanced to the lower SVC/right atrial junction. Fluoroscopy during the procedure and fluoro spot radiograph confirms appropriate catheter position. Catheter was sutured to the skin. The catheter was flushed and covered with a sterile dressing.  Complications: None  IMPRESSION: Successful right arm Power PICC line placement with ultrasound and fluoroscopic guidance. The catheter is ready for use.   Electronically Signed   By: Richarda Overlie M.D.   On: 07/01/2013 10:46   Ir US Guide Vasc Access Right  07/01/2013   CLINICAL DATA:  57 year old with anal cancer needs IV access  for chemotherapy.  EXAM: POWER INJECTABLE PICC LINE PLACEMENT WITH ULTRASOUND AND FLUOROSCOPIC GUIDANCE  FLUOROSCOPY TIME:  18 seconds  PROCEDURE: The patient was advised of the possible risks and complications and agreed to undergo the procedure. The patient was then brought to the angiographic suite for the procedure.  The right arm was prepped with chlorhexidine, draped in the usual sterile fashion using maximum barrier technique (cap and mask, sterile gown,  sterile gloves, large sterile sheet, hand hygiene and cutaneous antisepsis) and infiltrated locally with 1% Lidocaine.  Ultrasound demonstrated patency of the right basilic vein, and this was documented with an image. Under real-time ultrasound guidance, this vein was accessed with a 21 gauge micropuncture needle and image documentation was performed. A 0.018 wire was introduced in to the vein. A peel-away sheath was placed. A 5 French dual lumen Power PICC was advanced to the lower SVC/right atrial junction. Fluoroscopy during the procedure and fluoro spot radiograph confirms appropriate catheter position. Catheter was sutured to the skin. The catheter was flushed and covered with a sterile dressing.  Complications: None  IMPRESSION: Successful right arm Power PICC line placement with ultrasound and fluoroscopic guidance. The catheter is ready for use.   Electronically Signed   By: Richarda Overlie M.D.   On: 07/01/2013 10:46    Medications: I have reviewed the patient's current medications.  Assessment/Plan:  1. Squamous cell carcinoma of the anus, clinical stage II (T2 N0); staging PET scan 05/20/2013 with a solitary hypermetabolic focus in the anal canal and no evidence of local or distant metastatic disease. Radiation initiated 05/27/2013. Cycle 1 5-FU/mitomycin C. beginning 05/27/2013. Cycle 2 5-FU/mitomycin C. beginning 07/01/2013. Completion of radiation 07/05/2013. 2. History of hemorrhoids. 3. Rectal bleeding secondary to #1. Improved. 4. Oral mucositis following cycle 1 5-FU/mitomycin C. 5. Skin toxicity at the labia/perineum/perianal region related to radiation and chemotherapy.  Disposition-she has completed the course of radiation and chemotherapy. She will continue supportive measures for the skin toxicity. We anticipate signs of healing over the next one to 2 weeks.  Neutrophil count is mildly decreased. She understands to contact the office with fever, chills, other signs of  infection.  She has a followup visit with Dr. Romie Levee on 08/06/2012 and with Dr. Mitzi Hansen 08/08/2012. We will see her back in 4 months. She will contact the office in the interim with any problems.  Plan reviewed with Dr. Truett Perna.  Emily Compton ANP/GNP-BC

## 2013-07-12 MED ORDER — SILVER SULFADIAZINE 1 % EX CREA: 1.0000 "application " | TOPICAL_CREAM | Freq: Two times a day (BID) | CUTANEOUS | Status: DC

## 2013-07-12 NOTE — Progress Notes (Signed)
  Radiation Oncology         (336) [phone] ________________________________  Name: Emily Compton MRN: [id number]  Date: 07/05/2013  DOB: 05/15/1956  End of Treatment Note  Diagnosis:   Squamous cell carcinoma of the anal canal     Indication for treatment:  Curative       Radiation treatment dates:   05/27/2013 through 07/05/2013  Site/dose:   The patient was treated to the primary tumor and regional lymph nodes. The primary tumor received 50.4 gray in 28 fractions at 1.8 gray per fraction. The patient's treatment was carried out using IMRT with daily image guidance.  Narrative: The patient tolerated radiation treatment relatively well.   The patient had significant skin reaction at the end of treatment. This was managed with various creams including Silvadene at the end of treatment.  Plan: The patient has completed radiation treatment. The patient will return to radiation oncology clinic for routine followup in one month. I advised the patient to call or return sooner if they have any questions or concerns related to their recovery or treatment. ________________________________  Radene Gunning, M.D., Ph.D.

## 2013-07-16 ENCOUNTER — Other Ambulatory Visit: Payer: Self-pay | Admitting: *Deleted

## 2013-07-16 MED ORDER — OXYCODONE-ACETAMINOPHEN 5-325 MG PO TABS: 1.0000 | ORAL_TABLET | ORAL | Status: DC | PRN

## 2013-07-16 NOTE — Telephone Encounter (Signed)
Message from pt requesting Percocet refill. Has 10/325, would like to decrease to 5/325. Takes about 4 tabs/ day. Rx left in Rx book for pick up.

## 2013-08-06 ENCOUNTER — Encounter (INDEPENDENT_AMBULATORY_CARE_PROVIDER_SITE_OTHER): Payer: BC Managed Care – PPO | Admitting: General Surgery

## 2013-08-07 ENCOUNTER — Encounter: Payer: Self-pay | Admitting: Radiation Oncology

## 2013-08-08 ENCOUNTER — Ambulatory Visit
Admission: RE | Admit: 2013-08-08 | Discharge: 2013-08-08 | Disposition: A | Discharge status: Home / Self Care | Payer: BC Managed Care – PPO | Source: Ambulatory Visit | Attending: Radiation Oncology | Admitting: Radiation Oncology

## 2013-08-08 ENCOUNTER — Encounter: Payer: Self-pay | Admitting: Radiation Oncology

## 2013-08-08 VITALS — BP 91/61 | HR 76 | Temp 97.7°F | Resp 20 | Wt 121.2 lb

## 2013-08-08 DIAGNOSIS — C210 Malignant neoplasm of anus, unspecified: Secondary | ICD-10-CM

## 2013-08-08 HISTORY — DX: Personal history of irradiation: Z92.3

## 2013-08-08 NOTE — Progress Notes (Signed)
  Radiation Oncology         [phone]) [phone] ________________________________  Name: Emily Compton MRN: [id number]  Date: 08/08/2013  DOB: 09/29/1955  Follow-Up Visit Note  CC: Emily Coder, MD  Leighton Ruff, MD  Diagnosis:   Squamous cell carcinoma of the anal canal  Interval Since Last Radiation:  One month   Narrative:  The patient returns today for routine follow-up.  The patient states that she is doing fairly well. The patient indicates that her skin was quite irritated for a week or 2 after treatment but then healed quickly once this process began. No diarrhea. Regular bowel movements currently. Energy level is good.                              ALLERGIES:  is allergic to adhesive.  Meds: Current Outpatient Prescriptions  Medication Sig Dispense Refill  . bisacodyl (DULCOLAX) 5 MG EC tablet Take 5 mg by mouth daily as needed for moderate constipation.      . diazepam (VALIUM) 10 MG tablet Take 1 tablet (10 mg total) by mouth every 12 (twelve) hours as needed.  60 tablet  3  . lactobacillus acidophilus (BACID) TABS tablet Take 2 tablets by mouth 3 (three) times daily.      Marland Kitchen omeprazole (PRILOSEC OTC) 20 MG tablet Take 20 mg by mouth daily. Take one tablet as needed for acid reflux        No current facility-administered medications for this encounter.    Physical Findings: The patient is in no acute distress. Patient is alert and oriented.  weight is 121 lb 3.2 oz (54.976 kg). Her oral temperature is 97.7 F (36.5 C). Her blood pressure is 91/61 and her pulse is 76. Her respiration is 20. .   Radiation change present in the anal region. Overall her skin looks very good. Some hyperpigmentation remains.  Lab Findings: Lab Results  Component Value Date   WBC 1.3* 07/11/2013   HGB 11.9 07/11/2013   HCT 34.5* 07/11/2013   MCV 85.7 07/11/2013   PLT 152 07/11/2013     Radiographic Findings: No results found.  Impression:    The patient is doing satisfactorily  one month after completing her radiation treatment. Her skin looks quite good today.  Plan:  I will stagger her followup appointments, and she does have existing appointments both with surgery and medical oncology.   Jodelle Gross, M.D., Ph.D.

## 2013-08-08 NOTE — Progress Notes (Signed)
Follow up ana rad txs, 05/27/13-07/05/13, well healed stated patient, good appetite and good energy, no pain, no diarrhea,regular bm's 1:37 PM

## 2013-08-28 ENCOUNTER — Encounter (INDEPENDENT_AMBULATORY_CARE_PROVIDER_SITE_OTHER): Payer: Self-pay | Admitting: General Surgery

## 2013-08-28 ENCOUNTER — Ambulatory Visit (INDEPENDENT_AMBULATORY_CARE_PROVIDER_SITE_OTHER): Payer: BC Managed Care – PPO | Admitting: General Surgery

## 2013-08-28 VITALS — BP 94/58 | HR 72 | Temp 98.2°F | Resp 14 | Ht 60.0 in | Wt 120.2 lb

## 2013-08-28 DIAGNOSIS — C210 Malignant neoplasm of anus, unspecified: Secondary | ICD-10-CM

## 2013-08-28 NOTE — Patient Instructions (Signed)
Return to the office in 3 months 

## 2013-08-28 NOTE — Progress Notes (Signed)
Emily Compton is a 58 y.o. female who is here for a follow up visit regarding her anal cancer treatment. She began radiation on 06/27/2013 and it ended on 07/05/13. She completed cycle 1 5-FU/mitomycin C. beginning 05/27/2013. She is here today with complaints of anal pain and swelling.  Objective:  General appearance: alert and cooperative  GI: normal findings: soft, non-tender  Anal: no evidence of tumor  Procedure: Anoscopy Surgeon: Marcello Moores Assistant: Alphonzo Severance After the risks and benefits were explained, verbal consent was obtained for above procedure  Anesthesia: none Diagnosis:  Anal cancer Findings: no signs of tumor, scar noted L lateral anal sidewall  Assessment and Plan:   No further therapy indicated at this time. Return to my office 3 months.   Rosario Adie, [facility], [place]

## 2013-09-18 ENCOUNTER — Telehealth: Payer: Self-pay | Admitting: *Deleted

## 2013-09-18 NOTE — Telephone Encounter (Signed)
Forwarded medical release form patient brought in for participation in [place] [facility] to MD for signature.

## 2013-09-20 ENCOUNTER — Ambulatory Visit (HOSPITAL_BASED_OUTPATIENT_CLINIC_OR_DEPARTMENT_OTHER): Payer: BC Managed Care – PPO

## 2013-09-20 ENCOUNTER — Other Ambulatory Visit: Payer: Self-pay | Admitting: *Deleted

## 2013-09-20 ENCOUNTER — Encounter: Payer: Self-pay | Admitting: Oncology

## 2013-09-20 DIAGNOSIS — C210 Malignant neoplasm of anus, unspecified: Secondary | ICD-10-CM

## 2013-09-20 LAB — CBC WITH DIFFERENTIAL/PLATELET
BASO%: 0.2 % (ref 0.0–2.0)
BASOS ABS: 0 10*3/uL (ref 0.0–0.1)
EOS ABS: 0.6 10*3/uL — AB (ref 0.0–0.5)
EOS%: 9.9 % — ABNORMAL HIGH (ref 0.0–7.0)
HEMATOCRIT: 35.7 % (ref 34.8–46.6)
HGB: 11.6 g/dL (ref 11.6–15.9)
LYMPH%: 19.4 % (ref 14.0–49.7)
MCH: 30.6 pg (ref 25.1–34.0)
MCHC: 32.5 g/dL (ref 31.5–36.0)
MCV: 94 fL (ref 79.5–101.0)
MONO#: 0.6 10*3/uL (ref 0.1–0.9)
MONO%: 10.4 % (ref 0.0–14.0)
NEUT%: 60.1 % (ref 38.4–76.8)
NEUTROS ABS: 3.7 10*3/uL (ref 1.5–6.5)
Platelets: 230 10*3/uL (ref 145–400)
RBC: 3.79 10*6/uL (ref 3.70–5.45)
RDW: 15.8 % — ABNORMAL HIGH (ref 11.2–14.5)
WBC: 6.2 10*3/uL (ref 3.9–10.3)
lymph#: 1.2 10*3/uL (ref 0.9–3.3)

## 2013-09-23 ENCOUNTER — Telehealth: Payer: Self-pay | Admitting: *Deleted

## 2013-09-23 NOTE — Telephone Encounter (Signed)
Called and informed patient that CBC looks good.  Per Dr. Benay Spice.  Patient verbalized understanding.  Patient also came Friday afternoon and picked up form.

## 2013-09-23 NOTE — Telephone Encounter (Signed)
Message copied by Norma Fredrickson on Mon Sep 23, 2013 10:54 AM ------      Message from: Emily Compton      Created: Fri Sep 20, 2013  5:30 PM       Please call patient, cbc looks good ------

## 2013-09-23 NOTE — Telephone Encounter (Signed)
Message copied by Brien Few on Mon Sep 23, 2013 10:48 AM ------      Message from: Ladell Pier      Created: Fri Sep 20, 2013  5:30 PM       Please call patient, cbc looks good ------

## 2013-09-25 ENCOUNTER — Ambulatory Visit: Payer: BC Managed Care – PPO | Attending: Oncology | Admitting: Physical Therapy

## 2013-09-25 DIAGNOSIS — M62838 Other muscle spasm: Secondary | ICD-10-CM | POA: Insufficient documentation

## 2013-09-25 DIAGNOSIS — IMO0001 Reserved for inherently not codable concepts without codable children: Secondary | ICD-10-CM | POA: Insufficient documentation

## 2013-09-25 DIAGNOSIS — Z981 Arthrodesis status: Secondary | ICD-10-CM | POA: Insufficient documentation

## 2013-09-26 ENCOUNTER — Ambulatory Visit: Payer: BC Managed Care – PPO | Admitting: Physical Therapy

## 2013-10-02 ENCOUNTER — Other Ambulatory Visit: Payer: Self-pay | Admitting: Internal Medicine

## 2013-10-10 ENCOUNTER — Telehealth: Payer: Self-pay | Admitting: Oncology

## 2013-10-10 NOTE — Telephone Encounter (Signed)
Talked to pt gave her new appt date to 4/17rh lab and ML due to MD PAL

## 2013-10-14 ENCOUNTER — Other Ambulatory Visit: Payer: Self-pay | Admitting: Dermatology

## 2013-10-15 ENCOUNTER — Ambulatory Visit: Payer: BC Managed Care – PPO | Admitting: Physical Therapy

## 2013-10-18 ENCOUNTER — Ambulatory Visit (INDEPENDENT_AMBULATORY_CARE_PROVIDER_SITE_OTHER): Payer: BC Managed Care – PPO | Admitting: Internal Medicine

## 2013-10-18 ENCOUNTER — Encounter: Payer: Self-pay | Admitting: Internal Medicine

## 2013-10-18 VITALS — BP 110/62 | HR 74 | Temp 98.2°F | Resp 16 | Ht 60.0 in | Wt 117.0 lb

## 2013-10-18 DIAGNOSIS — M818 Other osteoporosis without current pathological fracture: Secondary | ICD-10-CM | POA: Insufficient documentation

## 2013-10-18 DIAGNOSIS — M549 Dorsalgia, unspecified: Secondary | ICD-10-CM

## 2013-10-18 DIAGNOSIS — M8580 Other specified disorders of bone density and structure, unspecified site: Secondary | ICD-10-CM

## 2013-10-18 DIAGNOSIS — M899 Disorder of bone, unspecified: Secondary | ICD-10-CM

## 2013-10-18 DIAGNOSIS — M949 Disorder of cartilage, unspecified: Secondary | ICD-10-CM

## 2013-10-18 MED ORDER — OXYCODONE-ACETAMINOPHEN 5-325 MG PO TABS: 1.0000 | ORAL_TABLET | ORAL | Status: DC | PRN

## 2013-10-18 NOTE — Patient Instructions (Signed)
Back Pain, Adult Low back pain is very common. About 1 in 5 people have back pain.The cause of low back pain is rarely dangerous. The pain often gets better over time.About half of people with a sudden onset of back pain feel better in just 2 weeks. About 8 in 10 people feel better by 6 weeks.  CAUSES Some common causes of back pain include:  Strain of the muscles or ligaments supporting the spine.  Wear and tear (degeneration) of the spinal discs.  Arthritis.  Direct injury to the back. DIAGNOSIS Most of the time, the direct cause of low back pain is not known.However, back pain can be treated effectively even when the exact cause of the pain is unknown.Answering your caregiver's questions about your overall health and symptoms is one of the most accurate ways to make sure the cause of your pain is not dangerous. If your caregiver needs more information, he or she may order lab work or imaging tests (X-rays or MRIs).However, even if imaging tests show changes in your back, this usually does not require surgery. HOME CARE INSTRUCTIONS For many people, back pain returns.Since low back pain is rarely dangerous, it is often a condition that people can learn to manageon their own.   Remain active. It is stressful on the back to sit or stand in one place. Do not sit, drive, or stand in one place for more than 30 minutes at a time. Take short walks on level surfaces as soon as pain allows.Try to increase the length of time you walk each day.  Do not stay in bed.Resting more than 1 or 2 days can delay your recovery.  Do not avoid exercise or work.Your body is made to move.It is not dangerous to be active, even though your back may hurt.Your back will likely heal faster if you return to being active before your pain is gone.  Pay attention to your body when you bend and lift. Many people have less discomfortwhen lifting if they bend their knees, keep the load close to their bodies,and  avoid twisting. Often, the most comfortable positions are those that put less stress on your recovering back.  Find a comfortable position to sleep. Use a firm mattress and lie on your side with your knees slightly bent. If you lie on your back, put a pillow under your knees.  Only take over-the-counter or prescription medicines as directed by your caregiver. Over-the-counter medicines to reduce pain and inflammation are often the most helpful.Your caregiver may prescribe muscle relaxant drugs.These medicines help dull your pain so you can more quickly return to your normal activities and healthy exercise.  Put ice on the injured area.  Put ice in a plastic bag.  Place a towel between your skin and the bag.  Leave the ice on for 15-20 minutes, 03-04 times a day for the first 2 to 3 days. After that, ice and heat may be alternated to reduce pain and spasms.  Ask your caregiver about trying back exercises and gentle massage. This may be of some benefit.  Avoid feeling anxious or stressed.Stress increases muscle tension and can worsen back pain.It is important to recognize when you are anxious or stressed and learn ways to manage it.Exercise is a great option. SEEK MEDICAL CARE IF:  You have pain that is not relieved with rest or medicine.  You have pain that does not improve in 1 week.  You have new symptoms.  You are generally not feeling well. SEEK   IMMEDIATE MEDICAL CARE IF:   You have pain that radiates from your back into your legs.  You develop new bowel or bladder control problems.  You have unusual weakness or numbness in your arms or legs.  You develop nausea or vomiting.  You develop abdominal pain.  You feel faint. Document Released: 07/18/2005 Document Revised: 01/17/2012 Document Reviewed: 12/06/2010 [employer] Patient Information 2014 [employer].  

## 2013-10-18 NOTE — Progress Notes (Signed)
Pre visit review using our clinic review tool, if applicable. No additional management support is needed unless otherwise documented below in the visit note. 

## 2013-10-19 ENCOUNTER — Encounter: Payer: Self-pay | Admitting: Internal Medicine

## 2013-10-19 NOTE — Assessment & Plan Note (Signed)
She is due for a DEXA scan 

## 2013-10-19 NOTE — Progress Notes (Signed)
Subjective:    Patient ID: Emily Compton, female    DOB: 1955-09-20, 58 y.o.   MRN: [id number]  Back Pain This is a chronic problem. The current episode started more than 1 year ago. The problem occurs intermittently. The problem has been gradually improving since onset. The pain is present in the lumbar spine. The quality of the pain is described as aching. The pain does not radiate. The pain is at a severity of 4/10. The pain is mild. The pain is worse during the day. The symptoms are aggravated by bending and position. Pertinent negatives include no abdominal pain, bladder incontinence, bowel incontinence, chest pain, dysuria, fever, headaches, leg pain, numbness, paresis, paresthesias, pelvic pain, perianal numbness, tingling, weakness or weight loss. Risk factors include history of cancer. She has tried NSAIDs and analgesics for the symptoms. The treatment provided significant relief.      Review of Systems  Constitutional: Negative.  Negative for fever, chills, weight loss, diaphoresis, appetite change and fatigue.  HENT: Negative.   Eyes: Negative.   Respiratory: Negative.  Negative for cough, choking, chest tightness, shortness of breath and stridor.   Cardiovascular: Negative.  Negative for chest pain, palpitations and leg swelling.  Gastrointestinal: Negative.  Negative for nausea, vomiting, abdominal pain, diarrhea, constipation, blood in stool and bowel incontinence.  Endocrine: Negative.   Genitourinary: Negative.  Negative for bladder incontinence, dysuria and pelvic pain.  Musculoskeletal: Positive for back pain. Negative for arthralgias, gait problem, joint swelling, myalgias, neck pain and neck stiffness.  Skin: Negative.   Allergic/Immunologic: Negative.   Neurological: Negative.  Negative for dizziness, tingling, tremors, weakness, numbness, headaches and paresthesias.  Hematological: Negative.  Negative for adenopathy. Does not bruise/bleed easily.    Psychiatric/Behavioral: Negative.        Objective:   Physical Exam  Vitals reviewed. Constitutional: She is oriented to person, place, and time. She appears well-developed and well-nourished. No distress.  HENT:  Head: Normocephalic and atraumatic.  Mouth/Throat: Oropharynx is clear and moist. No oropharyngeal exudate.  Eyes: Conjunctivae are normal. Right eye exhibits no discharge. Left eye exhibits no discharge. No scleral icterus.  Neck: Normal range of motion. Neck supple. No JVD present. No tracheal deviation present. No thyromegaly present.  Cardiovascular: Normal rate, regular rhythm, normal heart sounds and intact distal pulses.  Exam reveals no gallop and no friction rub.   No murmur heard. Pulmonary/Chest: Effort normal and breath sounds normal. No stridor. No respiratory distress. She has no wheezes. She has no rales. She exhibits no tenderness.  Abdominal: Soft. Bowel sounds are normal. She exhibits no distension and no mass. There is no tenderness. There is no rebound and no guarding.  Musculoskeletal: Normal range of motion. She exhibits no edema and no tenderness.       Lumbar back: Normal. She exhibits normal range of motion, no tenderness, no bony tenderness, no swelling, no edema, no deformity, no laceration, no pain, no spasm and normal pulse.  Lymphadenopathy:    She has no cervical adenopathy.  Neurological: She is alert and oriented to person, place, and time. She displays no atrophy, no tremor and normal reflexes. No cranial nerve deficit or sensory deficit. She exhibits normal muscle tone. She displays no seizure activity. Coordination and gait normal.  Reflex Scores:      Tricep reflexes are 1+ on the right side and 1+ on the left side.      Bicep reflexes are 1+ on the right side and 1+ on the left side.  Brachioradialis reflexes are 1+ on the right side and 1+ on the left side.      Patellar reflexes are 1+ on the right side and 1+ on the left side.       Achilles reflexes are 1+ on the right side and 1+ on the left side. Neg SLR in BLE  Skin: Skin is warm and dry. No rash noted. She is not diaphoretic. No erythema. No pallor.  Psychiatric: She has a normal mood and affect. Her behavior is normal. Judgment and thought content normal.          Assessment & Plan:

## 2013-10-19 NOTE — Assessment & Plan Note (Signed)
Cont percocet as needed for pain

## 2013-10-21 ENCOUNTER — Other Ambulatory Visit: Payer: Self-pay | Admitting: Obstetrics & Gynecology

## 2013-10-24 LAB — HM MAMMOGRAPHY: HM MAMMO: NORMAL

## 2013-11-08 ENCOUNTER — Ambulatory Visit: Payer: BC Managed Care – PPO | Admitting: Oncology

## 2013-11-08 ENCOUNTER — Other Ambulatory Visit: Payer: BC Managed Care – PPO

## 2013-11-12 ENCOUNTER — Encounter: Payer: Self-pay | Admitting: Nurse Practitioner

## 2013-11-15 ENCOUNTER — Ambulatory Visit (HOSPITAL_BASED_OUTPATIENT_CLINIC_OR_DEPARTMENT_OTHER): Payer: BC Managed Care – PPO | Admitting: Nurse Practitioner

## 2013-11-15 ENCOUNTER — Telehealth: Payer: Self-pay | Admitting: Oncology

## 2013-11-15 ENCOUNTER — Other Ambulatory Visit (HOSPITAL_BASED_OUTPATIENT_CLINIC_OR_DEPARTMENT_OTHER): Payer: BC Managed Care – PPO

## 2013-11-15 ENCOUNTER — Encounter: Payer: Self-pay | Admitting: Oncology

## 2013-11-15 VITALS — BP 105/67 | HR 68 | Temp 98.0°F | Resp 17 | Ht 60.0 in | Wt 117.5 lb

## 2013-11-15 DIAGNOSIS — C210 Malignant neoplasm of anus, unspecified: Secondary | ICD-10-CM

## 2013-11-15 LAB — CBC WITH DIFFERENTIAL/PLATELET
BASO%: 0.3 % (ref 0.0–2.0)
BASOS ABS: 0 10*3/uL (ref 0.0–0.1)
EOS%: 4.9 % (ref 0.0–7.0)
Eosinophils Absolute: 0.2 10*3/uL (ref 0.0–0.5)
HEMATOCRIT: 36.5 % (ref 34.8–46.6)
HGB: 11.9 g/dL (ref 11.6–15.9)
LYMPH%: 31.9 % (ref 14.0–49.7)
MCH: 29.5 pg (ref 25.1–34.0)
MCHC: 32.6 g/dL (ref 31.5–36.0)
MCV: 90.6 fL (ref 79.5–101.0)
MONO#: 0.4 10*3/uL (ref 0.1–0.9)
MONO%: 13.5 % (ref 0.0–14.0)
NEUT#: 1.5 10*3/uL (ref 1.5–6.5)
NEUT%: 49.4 % (ref 38.4–76.8)
PLATELETS: 181 10*3/uL (ref 145–400)
RBC: 4.03 10*6/uL (ref 3.70–5.45)
RDW: 13.6 % (ref 11.2–14.5)
WBC: 3 10*3/uL — ABNORMAL LOW (ref 3.9–10.3)
lymph#: 1 10*3/uL (ref 0.9–3.3)

## 2013-11-15 NOTE — Progress Notes (Signed)
  [place] OFFICE PROGRESS NOTE   Diagnosis:  Anal cancer.  INTERVAL HISTORY:   [name] returns as scheduled. She feels well. She denies rectal pain and bleeding. The skin has healed. Typical bowel habits consist of 3 or 4 stools in a 1 hour timeframe daily.   Objective:  Vital signs in last 24 hours:  Blood pressure 105/67, pulse 68, temperature 98 F (36.7 C), temperature source Oral, resp. rate 17, height 5' (1.524 m), weight 117 lb 8 oz (53.298 kg).    HEENT: No thrush or ulcerations. Lymphatics: No palpable cervical, supraclavicular, axillary or inguinal lymph nodes. Resp: Lungs clear. Cardio: Regular cardiac rhythm. GI: Abdomen soft and nontender. No hepatomegaly. Vascular: No leg edema. Rectal: Skin hyperpigmentation at the perineum/perianal region. No skin breakdown. External hemorrhoids. No mass.  Lab Results:  Lab Results  Component Value Date   WBC 3.0* 11/15/2013   HGB 11.9 11/15/2013   HCT 36.5 11/15/2013   MCV 90.6 11/15/2013   PLT 181 11/15/2013   NEUTROABS 1.5 11/15/2013    Imaging:  No results found.  Medications: I have reviewed the patient's current medications.  Assessment/Plan: 1. Squamous cell carcinoma of the anus, clinical stage II (T2 N0); staging PET scan 05/20/2013 with a solitary hypermetabolic focus in the anal canal and no evidence of local or distant metastatic disease. Radiation initiated 05/27/2013. Cycle 1 5-FU/mitomycin C. beginning 05/27/2013. Cycle 2 5-FU/mitomycin C. beginning 07/01/2013. Completion of radiation 07/05/2013. 2. History of hemorrhoids. 3. Rectal bleeding secondary to #1. Resolved. 4. Oral mucositis following cycle 1 5-FU/mitomycin C. Resolved. 5. Skin toxicity at the labia/perineum/perianal region related to radiation and chemotherapy. Resolved. 6. Pap smear 10/21/2013. High-grade squamous intraepithelial lesion. She requests followup with GYN oncology.   Disposition: She appears stable. She  remains in clinical remission from anal cancer. She will continue followup with Dr. Leighton Ruff. She will return for a followup visit here in 8 months.  We made a referral to GYN oncology due to the recent Pap smear finding as noted above.  She will contact the office prior to her next visit with any problems.  Plan reviewed with Dr. Benay Spice.    Owens Shark ANP/GNP-BC   11/15/2013  11:40 AM

## 2013-11-15 NOTE — Telephone Encounter (Signed)
Pt will see md on december, called Kim @ Gyn oncology left message regarding appt for pt

## 2013-11-15 NOTE — Telephone Encounter (Signed)
talked to pt and and gave her appt for Dr. Fermin Schwab on May 2015

## 2013-11-26 ENCOUNTER — Ambulatory Visit (INDEPENDENT_AMBULATORY_CARE_PROVIDER_SITE_OTHER): Payer: BC Managed Care – PPO | Admitting: General Surgery

## 2013-11-26 ENCOUNTER — Encounter (INDEPENDENT_AMBULATORY_CARE_PROVIDER_SITE_OTHER): Payer: Self-pay | Admitting: General Surgery

## 2013-11-26 VITALS — BP 98/68 | HR 70 | Temp 97.8°F | Resp 12 | Wt 115.0 lb

## 2013-11-26 DIAGNOSIS — C210 Malignant neoplasm of anus, unspecified: Secondary | ICD-10-CM

## 2013-11-26 NOTE — Patient Instructions (Signed)
Use of MiraLax as needed for constipation. Return to the office in 3 months for repeat exam.

## 2013-11-26 NOTE — Progress Notes (Signed)
THAILY HACKWORTH is a 58 y.o. female who is here for a follow up visit regarding her anal cancer treatment. She began radiation on 06/27/2013 and it ended on 07/05/13. She completed cycle 1 5-FU/mitomycin C. beginning 05/27/2013. She is here today with complaints of anal pain and swelling. She has had no major loss of weight or changes in GI habits.   Objective:  General appearance: alert and cooperative  GI: normal findings: soft, non-tender  Anal: no evidence of tumor   Procedure: Anoscopy  Surgeon: Marcello Moores  Assistant: Alphonzo Severance  After the risks and benefits were explained, verbal consent was obtained for above procedure  Anesthesia: none  Diagnosis: Anal cancer  Findings: no signs of tumor, scar noted L lateral anal sidewall   Assessment and Plan:  No further therapy indicated at this time.  Patient instructed to use MiraLax as needed for constipation.  Return to my office 3 months.

## 2013-12-13 ENCOUNTER — Encounter: Payer: Self-pay | Admitting: Gynecology

## 2013-12-13 ENCOUNTER — Ambulatory Visit: Payer: BC Managed Care – PPO | Attending: Gynecology | Admitting: Gynecology

## 2013-12-13 VITALS — BP 97/67 | HR 67 | Temp 97.9°F | Resp 18 | Ht 60.0 in | Wt 115.8 lb

## 2013-12-13 DIAGNOSIS — Z85048 Personal history of other malignant neoplasm of rectum, rectosigmoid junction, and anus: Secondary | ICD-10-CM | POA: Insufficient documentation

## 2013-12-13 DIAGNOSIS — Z923 Personal history of irradiation: Secondary | ICD-10-CM | POA: Insufficient documentation

## 2013-12-13 DIAGNOSIS — R87613 High grade squamous intraepithelial lesion on cytologic smear of cervix (HGSIL): Secondary | ICD-10-CM | POA: Insufficient documentation

## 2013-12-13 DIAGNOSIS — Z79899 Other long term (current) drug therapy: Secondary | ICD-10-CM | POA: Insufficient documentation

## 2013-12-13 DIAGNOSIS — IMO0002 Reserved for concepts with insufficient information to code with codable children: Secondary | ICD-10-CM

## 2013-12-13 DIAGNOSIS — D069 Carcinoma in situ of cervix, unspecified: Secondary | ICD-10-CM | POA: Insufficient documentation

## 2013-12-13 DIAGNOSIS — N952 Postmenopausal atrophic vaginitis: Secondary | ICD-10-CM | POA: Insufficient documentation

## 2013-12-13 DIAGNOSIS — N888 Other specified noninflammatory disorders of cervix uteri: Secondary | ICD-10-CM | POA: Insufficient documentation

## 2013-12-13 DIAGNOSIS — K219 Gastro-esophageal reflux disease without esophagitis: Secondary | ICD-10-CM | POA: Insufficient documentation

## 2013-12-13 MED ORDER — ESTRADIOL 0.1 MG/GM VA CREA: 1.0000 | TOPICAL_CREAM | VAGINAL | Status: DC

## 2013-12-13 NOTE — Progress Notes (Signed)
Consult Note: Gyn-Onc    [place] 58 y.o. female  Chief Complaint  Patient presents with  . HGSIL    New Consult    Assessment : HGSIL Pap smear. Vaginal and cervical atrophy. History of anal carcinoma treated with radiation therapy and chemotherapy.  Plan: Cervical biopsies are pending. In the interim bypass the patient to begin using Premarin cream 1/2 g vaginally at bedtime 3 times a week. If the biopsy reports are negative we will have the patient back in 2 months after she's used Premarin cream to mature the squamous epithelium and hopefully helping [place] identify any abnormal areas. Because of cervical stenosis she may require a cold knife conization as well. We will contact patient with a large report and create further planning after the report is returned. HPI: 58 year old white female seen in consultation request of Dr. Julieanne Manson regarding management of a HGSIL Pap smear. The patient reports that she may have had an abnormal Pap smear in 1978 treated with cryotherapy. She indicates she's had no other abnormal Pap smears until one obtained on 10/21/2013. That Pap smear showing high-grade SIL with atrophic changes. High risk HPV was not present. The patient denies any other gynecologic problems except for a ruptured ectopic pregnancy when she was 58 years of age. She denies any vaginal bleeding or discharge.  Patient has a pertinent history of squamous cell carcinoma of the anus (stage II) treated in October of 2014 with radiation therapy, 5-FU and mitomycin C. Radiation therapy was completed in December 2014. Patient has subsequent followup showing complete response based on anoscopy.  Review of Systems:10 point review of systems is negative except as noted in interval history.   Vitals: Blood pressure 97/67, pulse 67, temperature 97.9 F (36.6 C), temperature source Oral, resp. rate 18, height 5' (1.524 m), weight 115 lb 12.8 oz (52.527 kg).  Physical Exam: General : The  patient is a healthy woman in no acute distress.  HEENT: normocephalic, extraoccular movements normal; neck is supple without thyromegally  Lynphnodes: Supraclavicular and inguinal nodes not enlarged  Abdomen: Soft, non-tender, no ascites, no organomegally, no masses, no hernias  Pelvic:  EGBUS: Normal female  Vagina: Normal, no lesions , atrophic Urethra and Bladder: Normal, non-tender  Cervix: Atrophic. The cervical os is stenotic. Uterus: Normal shape size and consistency. Bi-manual examination: Non-tender; no adenxal masses or nodularity  Rectal: normal sphincter tone, no masses, no blood  Lower extremities: No edema or varicosities. Normal range of motion   Procedure note Colposcopy of the cervix and entire vagina performed using acetic acid white light and green filter. There is some punctation on the cervix at 2:00. The cervical os is stenotic and unable to perform an endocervical curettage. Biopsies of the cervix at 2:00 are obtained. Silver nitrate is used to achieve hemostasis.    Allergies  Allergen Reactions  . Adhesive [Tape] Rash    Past Medical History  Diagnosis Date  . Dental crowns present     and caps  . GERD (gastroesophageal reflux disease)     prn OTC  . Trigger middle finger of right hand 12/2011  . Cancer 04/30/13    anal =poorly diff squamous cell ca   . History of radiation therapy 05/27/13-07/05/13    anal canal/50.4Gy/15fx    Past Surgical History  Procedure Laterality Date  . Tubal ligation    . Tonsillectomy  age 62  . Lumbar fusion  03/02/2011    right transforaminal lumbar fusion L4-5; left posterolateral fusion L4-5  .  Trigger finger release  01/12/2012    Procedure: RELEASE TRIGGER FINGER/A-1 PULLEY;  Surgeon: Tennis Must, MD;  Location: [facility];  Service: Orthopedics;  Laterality: Right;  right long trigger release  . Hemorrhoid banding  08/29/2012    Procedure: HEMORRHOID BANDING;  Surgeon: Inda Castle, MD;  Location:  [facility];  Service: Endoscopy;  Laterality: N/A;  . Flexible sigmoidoscopy  08/29/2012    Procedure: FLEXIBLE SIGMOIDOSCOPY;  Surgeon: Inda Castle, MD;  Location: [facility];  Service: Endoscopy;  Laterality: N/A;    Current Outpatient Prescriptions  Medication Sig Dispense Refill  . conjugated estrogens (PREMARIN) vaginal cream Place 1 Applicatorful vaginally once a week.      . diazepam (VALIUM) 10 MG tablet TAKE ONE TABLET BY MOUTH EVERY 12 HOURS AS NEEDED  60 tablet  3  . lactobacillus acidophilus (BACID) TABS tablet Take 2 tablets by mouth 3 (three) times daily.      Marland Kitchen omeprazole (PRILOSEC OTC) 20 MG tablet Take 20 mg by mouth daily. Take one tablet as needed for acid reflux       . oxyCODONE-acetaminophen (PERCOCET/ROXICET) 5-325 MG per tablet Take 1 tablet by mouth every 4 (four) hours as needed for severe pain.  75 tablet  0   No current facility-administered medications for this visit.    History   Social History  . Marital Status: Married    Spouse Name: N/A    Number of Children: 0  . Years of Education: N/A   Occupational History  . office     middle school  .  [place] History Main Topics  . Smoking status: Never Smoker   . Smokeless tobacco: Never Used  . Alcohol Use: No     Comment: occasionally  . Drug Use: No  . Sexual Activity: Not Currently   Other Topics Concern  . 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 Breast cancer Mother   . Uterine cancer Mother   . Brain cancer Mother   . Cancer Mother     breast & uterin & brain  . Colon cancer Neg Hx       Alvino Chapel, MD 12/13/2013, 3:38 PM

## 2013-12-13 NOTE — Addendum Note (Signed)
Addended by: Lucile Crater on: 12/13/2013 05:05 PM   Modules accepted: Orders, Medications

## 2013-12-13 NOTE — Patient Instructions (Signed)
We will call you with the results of your biopsy, continue to use Premarin cream 3 times a week, follow up with Dr. Fermin Schwab to be determined after biopsy results are received.

## 2013-12-14 ENCOUNTER — Encounter: Payer: Self-pay | Admitting: Gynecology

## 2013-12-15 ENCOUNTER — Encounter: Payer: Self-pay | Admitting: Gynecology

## 2013-12-18 ENCOUNTER — Telehealth: Payer: Self-pay | Admitting: *Deleted

## 2013-12-18 NOTE — Telephone Encounter (Signed)
Called pt notified pt to continue estrogen cream f/u in 3 months.pt verbalized understanding. F/u made for aug 21. Pt confirmed.

## 2013-12-18 NOTE — Telephone Encounter (Signed)
Message copied by Lucile Crater on Wed Dec 18, 2013  9:58 AM ------      Message from: Marti Sleigh      Created: Tue Dec 17, 2013  8:58 PM       Please let her know the bx was low grade. Tell her to continue vaginal estrogen for 3 months, then have her come back to see me for a pap smear and colpo 3 months.  Thanks,  cp      ----- Message -----         From: Lab in [facility] Interface         Sent: 12/17/2013  10:02 AM           To: Alvino Chapel, MD                   ------

## 2013-12-18 NOTE — Telephone Encounter (Signed)
Called pt lmovm to call office for results

## 2013-12-24 ENCOUNTER — Encounter: Payer: Self-pay | Admitting: Oncology

## 2013-12-30 ENCOUNTER — Encounter: Payer: Self-pay | Admitting: Oncology

## 2014-01-01 ENCOUNTER — Other Ambulatory Visit: Payer: Self-pay | Admitting: *Deleted

## 2014-01-01 ENCOUNTER — Encounter: Payer: Self-pay | Admitting: *Deleted

## 2014-01-01 DIAGNOSIS — C210 Malignant neoplasm of anus, unspecified: Secondary | ICD-10-CM

## 2014-01-01 NOTE — Progress Notes (Signed)
OK for [facility] referral per Dr. Benay Spice.

## 2014-01-27 ENCOUNTER — Encounter: Payer: Self-pay | Admitting: Oncology

## 2014-01-28 NOTE — Telephone Encounter (Signed)
Message printed and to provider last night for review.  Will notify patient.

## 2014-01-28 NOTE — Telephone Encounter (Signed)
Added this information to the first request for Dr. Gearldine Shown review.

## 2014-02-02 ENCOUNTER — Other Ambulatory Visit: Payer: Self-pay | Admitting: Oncology

## 2014-02-03 ENCOUNTER — Telehealth: Payer: Self-pay | Admitting: *Deleted

## 2014-02-03 NOTE — Telephone Encounter (Signed)
Pt returned call, informed her of Dr. Gearldine Shown recommendation to follow up as scheduled. No indication for CT at this time. PET done in October was neg. Pt is uncomfortable waiting until next visit for scan. Stated "I feel like I'm not getting proper follow up." Pt reports she feels a nodular area on sternum that is tender to touch. "Nobody is looking at it." She thinks it is getting larger. Scheduled to see colorectal surgeon later this month- plans to ask her to order scans. Pt would like to be worked in for evaluation of nodule. Will review with Dr. Benay Spice.

## 2014-02-03 NOTE — Telephone Encounter (Signed)
In response to MyChart message on 6/30: Dr. Benay Spice sees no indication for CT. PET was negative 05/2013. No note from Dr. Bearl Mulberry, unable to find in Care Everywhere either. Follow up as scheduled. Can see pt sooner in office if she wants, per Dr. Benay Spice.  Left message on voicemail for pt to call office.

## 2014-02-04 NOTE — Telephone Encounter (Signed)
Left message on voicemail informing pt of work in appt 02/05/14 at 1115 with Lattie Haw, NP.

## 2014-02-05 ENCOUNTER — Ambulatory Visit (HOSPITAL_BASED_OUTPATIENT_CLINIC_OR_DEPARTMENT_OTHER): Payer: BC Managed Care – PPO | Admitting: Nurse Practitioner

## 2014-02-05 ENCOUNTER — Telehealth: Payer: Self-pay | Admitting: Oncology

## 2014-02-05 VITALS — BP 104/59 | HR 65 | Temp 98.7°F | Resp 18 | Ht 60.0 in | Wt 119.3 lb

## 2014-02-05 DIAGNOSIS — Z85048 Personal history of other malignant neoplasm of rectum, rectosigmoid junction, and anus: Secondary | ICD-10-CM

## 2014-02-05 DIAGNOSIS — C210 Malignant neoplasm of anus, unspecified: Secondary | ICD-10-CM

## 2014-02-05 NOTE — Progress Notes (Addendum)
  [place] OFFICE PROGRESS NOTE   Diagnosis:  Anal cancer.  INTERVAL HISTORY:   [name] returns prior to scheduled followup. She reports a good appetite. She is concerned about a "knot" in the substernal region. She has noted it about 3-4 months. There is some associated pain with palpation. She is having mild intermittent rectal bleeding. No cough or shortness of breath.  She is concerned about a liver lesion identified on the initial CT scan 05/06/2013. Objective:  Vital signs in last 24 hours:  Blood pressure 104/59, pulse 65, temperature 98.7 F (37.1 C), temperature source Oral, resp. rate 18, height 5' (1.524 m), weight 119 lb 4.8 oz (54.114 kg).    HEENT: No thrush or ulcerations. Lymphatics: No palpable cervical, supraclavicular, axillary or inguinal lymph nodes. Resp: Lungs clear. Cardio: Regular cardiac rhythm. GI: Abdomen soft and nontender. No hepatomegaly. No mass. Vascular: No leg edema.     Lab Results:  Lab Results  Component Value Date   WBC 3.0* 11/15/2013   HGB 11.9 11/15/2013   HCT 36.5 11/15/2013   MCV 90.6 11/15/2013   PLT 181 11/15/2013   NEUTROABS 1.5 11/15/2013    Medications: I have reviewed the patient's current medications.  Assessment/Plan: 1. Squamous cell carcinoma of the anus, clinical stage II (T2 N0); staging PET scan 05/20/2013 with a solitary hypermetabolic focus in the anal canal and no evidence of local or distant metastatic disease. Radiation initiated 05/27/2013. Cycle 1 5-FU/mitomycin C. beginning 05/27/2013. Cycle 2 5-FU/mitomycin C. beginning 07/01/2013. Completion of radiation 07/05/2013. Anoscopy 11/26/2013 with no signs of tumor. Scar noted left lateral anal sidewall. 2. History of hemorrhoids. 3. Rectal bleeding secondary to #1. Resolved. 4. Oral mucositis following cycle 1 5-FU/mitomycin C. Resolved. 5. Skin toxicity at the labia/perineum/perianal region related to radiation and chemotherapy.  Resolved. 6. Pap smear 10/21/2013. High-grade squamous intraepithelial lesion. She is following up with GYN oncology. 7. "Knot" at the sub-sternal region. No mass on exam 02/05/2014.   Disposition: She remains in clinical remission from anal cancer. She will continue followup with Dr. Leighton Ruff. She has an appointment scheduled with Dr. Marcello Moores on 02/24/2014. She will contact their office with persistent rectal bleeding.  The "small hypoattenuating structure within the anterior left hepatic lobe" on CT 05/06/2013 was too small to characterize but the radiologist felt likely represented a small cyst. Staging PET scan on 05/20/2013 showed no evidence of local or distant metastatic disease. Dr. Benay Spice discussed the low likelihood that the abnormality represents metastatic disease. She would like to discuss the indication for a CT scan with Dr. Marcello Moores. For now she prefers to do a followup scan in October of this year.  The "knot" at the substernal region corresponds to the xiphoid process.  She is scheduled to return for a followup visit with Dr. Benay Spice on 07/17/2014. She understands she can contact the office in the interim with problems, questions or concerns.  Patient seen with Dr. Benay Spice.    Ned Card ANP/GNP-BC   02/05/2014  1:08 PM This was a shared visit with Ned Card. The area of palpable concern corresponds with the xiphoid process.  She would like to proceed with a repeat abdomen CT to followup on the indeterminate liver lesion noted in October 2014.  Emily Compton, M.D.

## 2014-02-05 NOTE — Telephone Encounter (Signed)
gv adn printed appt sched and avs for pt for OCT adn Dec....pt wants water based

## 2014-02-24 ENCOUNTER — Ambulatory Visit (INDEPENDENT_AMBULATORY_CARE_PROVIDER_SITE_OTHER): Payer: BC Managed Care – PPO | Admitting: General Surgery

## 2014-02-24 ENCOUNTER — Encounter (INDEPENDENT_AMBULATORY_CARE_PROVIDER_SITE_OTHER): Payer: Self-pay | Admitting: General Surgery

## 2014-02-24 VITALS — BP 124/80 | HR 72 | Temp 97.0°F | Ht 60.0 in | Wt 115.0 lb

## 2014-02-24 DIAGNOSIS — Z85048 Personal history of other malignant neoplasm of rectum, rectosigmoid junction, and anus: Secondary | ICD-10-CM

## 2014-02-24 NOTE — Progress Notes (Signed)
Emily Compton is a 58 y.o. female who is here for a follow up visit regarding her anal cancer treatment. She began radiation on 06/27/2013 and it ended on 07/05/13. She completed cycle 1 5-FU/mitomycin C. beginning 05/27/2013. She is here today with complaints of anal pain and swelling. She has had no major loss of weight or changes in GI habits. She has had some minor bleeding with hard BM's.  Objective:  Filed Vitals:   02/24/14 1108  BP: 124/80  Pulse: 72  Temp: 97 F (36.1 C)   General appearance: alert and cooperative  GI: normal findings: soft, non-tender  No inguinal lymphadenopathy  Anal: no evidence of tumor   Procedure: Anoscopy  Surgeon: Marcello Moores  Assistant: [place] After the risks and benefits were explained, verbal consent was obtained for above procedure  Anesthesia: none  Diagnosis: Anal cancer  Findings: no signs of tumor, scar noted L lateral anal sidewall, mild inflammation posteriorly  Assessment and Plan:  No further therapy indicated at this time.  No evidence of recurrence. Return to office 3 months.

## 2014-02-24 NOTE — Patient Instructions (Signed)
Return to the office in 3 months for recheck.

## 2014-03-21 ENCOUNTER — Encounter: Payer: Self-pay | Admitting: Gynecology

## 2014-03-21 ENCOUNTER — Other Ambulatory Visit (HOSPITAL_COMMUNITY)
Admission: RE | Admit: 2014-03-21 | Discharge: 2014-03-21 | Disposition: A | Discharge status: Home / Self Care | Payer: BC Managed Care – PPO | Source: Ambulatory Visit | Attending: Gynecology | Admitting: Gynecology

## 2014-03-21 ENCOUNTER — Ambulatory Visit: Payer: BC Managed Care – PPO | Attending: Gynecology | Admitting: Gynecology

## 2014-03-21 VITALS — BP 101/65 | HR 72 | Temp 98.5°F | Resp 18 | Ht 60.0 in | Wt 119.8 lb

## 2014-03-21 DIAGNOSIS — N952 Postmenopausal atrophic vaginitis: Secondary | ICD-10-CM | POA: Insufficient documentation

## 2014-03-21 DIAGNOSIS — Z124 Encounter for screening for malignant neoplasm of cervix: Secondary | ICD-10-CM | POA: Insufficient documentation

## 2014-03-21 DIAGNOSIS — Z87898 Personal history of other specified conditions: Secondary | ICD-10-CM | POA: Diagnosis not present

## 2014-03-21 DIAGNOSIS — K219 Gastro-esophageal reflux disease without esophagitis: Secondary | ICD-10-CM | POA: Insufficient documentation

## 2014-03-21 DIAGNOSIS — R87623 High grade squamous intraepithelial lesion on cytologic smear of vagina (HGSIL): Secondary | ICD-10-CM | POA: Diagnosis not present

## 2014-03-21 DIAGNOSIS — Z859 Personal history of malignant neoplasm, unspecified: Secondary | ICD-10-CM | POA: Insufficient documentation

## 2014-03-21 DIAGNOSIS — Z79899 Other long term (current) drug therapy: Secondary | ICD-10-CM | POA: Diagnosis not present

## 2014-03-21 DIAGNOSIS — R6889 Other general symptoms and signs: Secondary | ICD-10-CM

## 2014-03-21 DIAGNOSIS — IMO0002 Reserved for concepts with insufficient information to code with codable children: Secondary | ICD-10-CM

## 2014-03-21 DIAGNOSIS — Z85048 Personal history of other malignant neoplasm of rectum, rectosigmoid junction, and anus: Secondary | ICD-10-CM | POA: Diagnosis not present

## 2014-03-21 DIAGNOSIS — Z923 Personal history of irradiation: Secondary | ICD-10-CM | POA: Diagnosis not present

## 2014-03-21 DIAGNOSIS — Z1151 Encounter for screening for human papillomavirus (HPV): Secondary | ICD-10-CM | POA: Diagnosis present

## 2014-03-21 DIAGNOSIS — R87613 High grade squamous intraepithelial lesion on cytologic smear of cervix (HGSIL): Secondary | ICD-10-CM

## 2014-03-21 DIAGNOSIS — Z9221 Personal history of antineoplastic chemotherapy: Secondary | ICD-10-CM | POA: Insufficient documentation

## 2014-03-21 NOTE — Progress Notes (Signed)
Consult Note: Gyn-Onc   Emily Compton 58 y.o. female  Chief Complaint  Patient presents with  . HGSIL (high grade squamous intraepithelial dysplasia)    Assessment : History of HGSIL Pap smear with colposcopic biopsy showing only mild dysplasia. Vaginal and cervical atrophy. History of anal carcinoma treated with radiation therapy and chemotherapy.  Colposcopic examination today is normal except for some vaginal atrophic changes.  Plan:  Pap smears are obtained. Colposcopy shows no abnormalities. The patient will continue using Premarin cream 3 times a week. We will contact her with a Pap smear report.  If the Pap smear is normal she will have a repeat Pap smear in 6 months.  If the Pap smear shows low-grade SIL we will repeat a Pap smear in 6 months.  If the Pap smear shows high grade SIL I would recommend the patient undergo cold knife conization.  Interval history: Following artificial visit the patient was given a prescription for Premarin vaginal cream which she says she's been using on a regular basis 3 times a week. She says she feels very well as recent returned from a vacation in [place]. She denies any pelvic pain pressure vaginal bleeding or discharge.   HPI: 58 year old white female seen in consultation request of Dr. Julieanne Manson regarding management of a HGSIL Pap smear. The patient reports that she may have had an abnormal Pap smear in 1978 treated with cryotherapy. She indicates she's had no other abnormal Pap smears until one obtained on 10/21/2013. That Pap smear showing high-grade SIL with atrophic changes. High risk HPV was not present. The patient denies any other gynecologic problems except for a ruptured ectopic pregnancy when she was 58 years of age. She denies any vaginal bleeding or discharge.  Patient has a pertinent history of squamous cell carcinoma of the anus (stage II) treated in October of 2014 with radiation therapy, 5-FU and mitomycin C. Radiation  therapy was completed in December 2014. Patient has subsequent followup showing complete response based on anoscopy.  Patient underwent colposcopic examination in May of 2015. Biopsy showed only mild dysplasia. The patient was placed on Premarin vaginal cream because of severe vaginal atrophy.  Review of Systems:10 point review of systems is negative except as noted in interval history.   Vitals: Blood pressure 101/65, pulse 72, temperature 98.5 F (36.9 C), temperature source Oral, resp. rate 18, height 5' (1.524 m), weight 119 lb 12.8 oz (54.341 kg).  Physical Exam: General : The patient is a healthy woman in no acute distress.  HEENT: normocephalic, extraoccular movements normal; neck is supple without thyromegally  Lynphnodes: Supraclavicular and inguinal nodes not enlarged  Abdomen: Soft, non-tender, no ascites, no organomegally, no masses, no hernias  Pelvic:  EGBUS: Normal female  Vagina: Normal, no lesions , atrophic Urethra and Bladder: Normal, non-tender  Cervix: Atrophic. The cervical os is less stenotic today, and I am able to use an endocervical brush without difficulty. Uterus: Normal shape size and consistency. Bi-manual examination: Non-tender; no adenxal masses or nodularity  Rectal: Deferred  Lower extremities: No edema or varicosities. Normal range of motion   Procedure note Colposcopy of the cervix and entire vagina performed using acetic acid white light and green filter. There is some punctation on the cervix and vagina. No lesions are noted.   Allergies  Allergen Reactions  . Adhesive [Tape] Rash    Past Medical History  Diagnosis Date  . Dental crowns present     and caps  . GERD (gastroesophageal reflux disease)  prn OTC  . Trigger middle finger of right hand 12/2011  . Cancer 04/30/13    anal =poorly diff squamous cell ca   . History of radiation therapy 05/27/13-07/05/13    anal canal/50.4Gy/18fx    Past Surgical History  Procedure Laterality  Date  . Tubal ligation    . Tonsillectomy  age 80  . Lumbar fusion  03/02/2011    right transforaminal lumbar fusion L4-5; left posterolateral fusion L4-5  . Trigger finger release  01/12/2012    Procedure: RELEASE TRIGGER FINGER/A-1 PULLEY;  Surgeon: Tennis Must, MD;  Location: [facility];  Service: Orthopedics;  Laterality: Right;  right long trigger release  . Hemorrhoid banding  08/29/2012    Procedure: HEMORRHOID BANDING;  Surgeon: Inda Castle, MD;  Location: [facility];  Service: Endoscopy;  Laterality: N/A;  . Flexible sigmoidoscopy  08/29/2012    Procedure: FLEXIBLE SIGMOIDOSCOPY;  Surgeon: Inda Castle, MD;  Location: [facility];  Service: Endoscopy;  Laterality: N/A;    Current Outpatient Prescriptions  Medication Sig Dispense Refill  . diazepam (VALIUM) 10 MG tablet TAKE ONE TABLET BY MOUTH EVERY 12 HOURS AS NEEDED  60 tablet  3  . lactobacillus acidophilus (BACID) TABS tablet Take 2 tablets by mouth 3 (three) times daily.      Marland Kitchen oxyCODONE-acetaminophen (PERCOCET/ROXICET) 5-325 MG per tablet Take 1 tablet by mouth every 4 (four) hours as needed for severe pain.  75 tablet  0  . estradiol (ESTRACE VAGINAL) 0.1 MG/GM vaginal cream Place 1 Applicatorful vaginally 3 (three) times a week.  42.5 g  12  . methocarbamol (ROBAXIN) 500 MG tablet       . omeprazole (PRILOSEC OTC) 20 MG tablet Take 20 mg by mouth daily. Take one tablet as needed for acid reflux        No current facility-administered medications for this visit.    History   Social History  . Marital Status: Married    Spouse Name: N/A    Number of Children: 0  . Years of Education: N/A   Occupational History  . office     middle school  .  [place] History Main Topics  . Smoking status: Never Smoker   . Smokeless tobacco: Never Used  . Alcohol Use: No     Comment: occasionally  . Drug Use: No  . Sexual Activity: Not Currently   Other Topics Concern  . 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 Breast cancer Mother   . Uterine cancer Mother   . Brain cancer Mother   . Cancer Mother     breast & uterin & brain  . Colon cancer Neg Hx       CLARKE-PEARSON,Mackinzie Vuncannon L, MD 03/21/2014, 12:04 PM

## 2014-03-21 NOTE — Patient Instructions (Signed)
We will contact you with the results of your pap smear from today.  Please call for any questions or concerns.

## 2014-03-26 LAB — CYTOLOGY - PAP

## 2014-04-02 ENCOUNTER — Telehealth: Payer: Self-pay | Admitting: Gynecologic Oncology

## 2014-04-02 NOTE — Telephone Encounter (Signed)
Patient advised of pap smear results and Dr. Mora Bellman recommendations for a repeat pap in six months.  She is advised to call in the next several months to schedule an appt.

## 2014-04-19 ENCOUNTER — Other Ambulatory Visit: Payer: Self-pay | Admitting: Internal Medicine

## 2014-05-05 ENCOUNTER — Other Ambulatory Visit: Payer: Self-pay | Admitting: Internal Medicine

## 2014-05-05 ENCOUNTER — Telehealth: Payer: Self-pay | Admitting: Internal Medicine

## 2014-05-05 NOTE — Telephone Encounter (Signed)
Denied has not been seen since 11/2013

## 2014-05-05 NOTE — Telephone Encounter (Signed)
Patient has med fu next Thursday.  She is requesting script on oxycodone before then.

## 2014-05-12 ENCOUNTER — Encounter (HOSPITAL_COMMUNITY): Payer: Self-pay

## 2014-05-12 ENCOUNTER — Ambulatory Visit: Payer: BC Managed Care – PPO | Admitting: Nurse Practitioner

## 2014-05-12 ENCOUNTER — Ambulatory Visit (HOSPITAL_COMMUNITY)
Admission: RE | Admit: 2014-05-12 | Discharge: 2014-05-12 | Disposition: A | Discharge status: Home / Self Care | Payer: BC Managed Care – PPO | Source: Ambulatory Visit | Attending: Nurse Practitioner | Admitting: Nurse Practitioner

## 2014-05-12 ENCOUNTER — Other Ambulatory Visit (HOSPITAL_BASED_OUTPATIENT_CLINIC_OR_DEPARTMENT_OTHER): Payer: BC Managed Care – PPO

## 2014-05-12 DIAGNOSIS — K769 Liver disease, unspecified: Secondary | ICD-10-CM | POA: Insufficient documentation

## 2014-05-12 DIAGNOSIS — C210 Malignant neoplasm of anus, unspecified: Secondary | ICD-10-CM

## 2014-05-12 DIAGNOSIS — Z9221 Personal history of antineoplastic chemotherapy: Secondary | ICD-10-CM | POA: Diagnosis not present

## 2014-05-12 DIAGNOSIS — Z85048 Personal history of other malignant neoplasm of rectum, rectosigmoid junction, and anus: Secondary | ICD-10-CM

## 2014-05-12 DIAGNOSIS — Z923 Personal history of irradiation: Secondary | ICD-10-CM | POA: Insufficient documentation

## 2014-05-12 DIAGNOSIS — R110 Nausea: Secondary | ICD-10-CM | POA: Diagnosis not present

## 2014-05-12 LAB — BASIC METABOLIC PANEL (CC13)
Anion Gap: 8 mEq/L (ref 3–11)
BUN: 11 mg/dL (ref 7.0–26.0)
CALCIUM: 9.9 mg/dL (ref 8.4–10.4)
CHLORIDE: 107 meq/L (ref 98–109)
CO2: 28 mEq/L (ref 22–29)
Creatinine: 0.8 mg/dL (ref 0.6–1.1)
Glucose: 93 mg/dl (ref 70–140)
Potassium: 4.2 mEq/L (ref 3.5–5.1)
SODIUM: 143 meq/L (ref 136–145)

## 2014-05-12 MED ORDER — IOHEXOL 300 MG/ML  SOLN
80.0000 mL | Freq: Once | INTRAMUSCULAR | Status: AC | PRN
  Administered 2014-05-12: 80 mL via INTRAVENOUS

## 2014-05-12 MED ORDER — IOHEXOL 300 MG/ML  SOLN
50.0000 mL | Freq: Once | INTRAMUSCULAR | Status: AC | PRN
  Administered 2014-05-12: 50 mL via ORAL

## 2014-05-15 ENCOUNTER — Ambulatory Visit (INDEPENDENT_AMBULATORY_CARE_PROVIDER_SITE_OTHER): Payer: BC Managed Care – PPO | Admitting: Internal Medicine

## 2014-05-15 ENCOUNTER — Other Ambulatory Visit (INDEPENDENT_AMBULATORY_CARE_PROVIDER_SITE_OTHER): Payer: BC Managed Care – PPO

## 2014-05-15 ENCOUNTER — Encounter: Payer: Self-pay | Admitting: Internal Medicine

## 2014-05-15 VITALS — BP 120/80 | HR 54 | Temp 98.5°F | Resp 16 | Ht 60.0 in | Wt 119.0 lb

## 2014-05-15 DIAGNOSIS — Z23 Encounter for immunization: Secondary | ICD-10-CM

## 2014-05-15 DIAGNOSIS — M8580 Other specified disorders of bone density and structure, unspecified site: Secondary | ICD-10-CM

## 2014-05-15 DIAGNOSIS — F411 Generalized anxiety disorder: Secondary | ICD-10-CM | POA: Insufficient documentation

## 2014-05-15 DIAGNOSIS — Z0000 Encounter for general adult medical examination without abnormal findings: Secondary | ICD-10-CM

## 2014-05-15 DIAGNOSIS — M546 Pain in thoracic spine: Secondary | ICD-10-CM

## 2014-05-15 DIAGNOSIS — M5489 Other dorsalgia: Secondary | ICD-10-CM

## 2014-05-15 LAB — CBC WITH DIFFERENTIAL/PLATELET
BASOS ABS: 0 10*3/uL (ref 0.0–0.1)
Basophils Relative: 0.5 % (ref 0.0–3.0)
Eosinophils Absolute: 0.1 10*3/uL (ref 0.0–0.7)
Eosinophils Relative: 2 % (ref 0.0–5.0)
HCT: 36.5 % (ref 36.0–46.0)
Hemoglobin: 12 g/dL (ref 12.0–15.0)
LYMPHS PCT: 30.8 % (ref 12.0–46.0)
Lymphs Abs: 1 10*3/uL (ref 0.7–4.0)
MCHC: 33 g/dL (ref 30.0–36.0)
MCV: 90.9 fl (ref 78.0–100.0)
MONO ABS: 0.4 10*3/uL (ref 0.1–1.0)
Monocytes Relative: 11.7 % (ref 3.0–12.0)
NEUTROS PCT: 55 % (ref 43.0–77.0)
Neutro Abs: 1.7 10*3/uL (ref 1.4–7.7)
PLATELETS: 213 10*3/uL (ref 150.0–400.0)
RBC: 4.01 Mil/uL (ref 3.87–5.11)
RDW: 14 % (ref 11.5–15.5)
WBC: 3.2 10*3/uL — AB (ref 4.0–10.5)

## 2014-05-15 LAB — COMPREHENSIVE METABOLIC PANEL
ALT: 18 U/L (ref 0–35)
AST: 21 U/L (ref 0–37)
Albumin: 3.7 g/dL (ref 3.5–5.2)
Alkaline Phosphatase: 60 U/L (ref 39–117)
BILIRUBIN TOTAL: 0.5 mg/dL (ref 0.2–1.2)
BUN: 10 mg/dL (ref 6–23)
CO2: 29 mEq/L (ref 19–32)
CREATININE: 0.8 mg/dL (ref 0.4–1.2)
Calcium: 9.3 mg/dL (ref 8.4–10.5)
Chloride: 103 mEq/L (ref 96–112)
GFR: 76.58 mL/min (ref >60.00)
Glucose, Bld: 87 mg/dL (ref 70–99)
Potassium: 4.1 mEq/L (ref 3.5–5.1)
Sodium: 138 mEq/L (ref 135–145)
Total Protein: 7.3 g/dL (ref 6.0–8.3)

## 2014-05-15 LAB — LIPID PANEL
CHOL/HDL RATIO: 4
Cholesterol: 234 mg/dL — ABNORMAL HIGH (ref 0–200)
HDL: 63.9 mg/dL (ref >39.00)
LDL Cholesterol: 155 mg/dL — ABNORMAL HIGH (ref 0–99)
NONHDL: 170.1
Triglycerides: 76 mg/dL (ref 0.0–149.0)
VLDL: 15.2 mg/dL (ref 0.0–40.0)

## 2014-05-15 LAB — TSH: TSH: 2.6 u[IU]/mL (ref 0.35–4.50)

## 2014-05-15 LAB — VITAMIN D 25 HYDROXY (VIT D DEFICIENCY, FRACTURES): VITD: 40.24 ng/mL (ref 30.00–100.00)

## 2014-05-15 MED ORDER — OXYCODONE-ACETAMINOPHEN 5-325 MG PO TABS: 1.0000 | ORAL_TABLET | ORAL | Status: DC | PRN

## 2014-05-15 MED ORDER — DIAZEPAM 10 MG PO TABS: 10.0000 mg | ORAL_TABLET | Freq: Two times a day (BID) | ORAL | Status: DC | PRN

## 2014-05-15 NOTE — Patient Instructions (Signed)
Preventive Care for Adults A healthy lifestyle and preventive care can promote health and wellness. Preventive health guidelines for women include the following key practices.  A routine yearly physical is a good way to check with your health care provider about your health and preventive screening. It is a chance to share any concerns and updates on your health and to receive a thorough exam.  Visit your dentist for a routine exam and preventive care every 6 months. Brush your teeth twice a day and floss once a day. Good oral hygiene prevents tooth decay and gum disease.  The frequency of eye exams is based on your age, health, family medical history, use of contact lenses, and other factors. Follow your health care provider's recommendations for frequency of eye exams.  Eat a healthy diet. Foods like vegetables, fruits, whole grains, low-fat dairy products, and lean protein foods contain the nutrients you need without too many calories. Decrease your intake of foods high in solid fats, added sugars, and salt. Eat the right amount of calories for you.Get information about a proper diet from your health care provider, if necessary.  Regular physical exercise is one of the most important things you can do for your health. Most adults should get at least 150 minutes of moderate-intensity exercise (any activity that increases your heart rate and causes you to sweat) each week. In addition, most adults need muscle-strengthening exercises on 2 or more days a week.  Maintain a healthy weight. The body mass index (BMI) is a screening tool to identify possible weight problems. It provides an estimate of body fat based on height and weight. Your health care provider can find your BMI and can help you achieve or maintain a healthy weight.For adults 20 years and older:  A BMI below 18.5 is considered underweight.  A BMI of 18.5 to 24.9 is normal.  A BMI of 25 to 29.9 is considered overweight.  A BMI of  30 and above is considered obese.  Maintain normal blood lipids and cholesterol levels by exercising and minimizing your intake of saturated fat. Eat a balanced diet with plenty of fruit and vegetables. Blood tests for lipids and cholesterol should begin at age 76 and be repeated every 5 years. If your lipid or cholesterol levels are high, you are over 50, or you are at high risk for heart disease, you may need your cholesterol levels checked more frequently.Ongoing high lipid and cholesterol levels should be treated with medicines if diet and exercise are not working.  If you smoke, find out from your health care provider how to quit. If you do not use tobacco, do not start.  Lung cancer screening is recommended for adults aged 22-80 years who are at high risk for developing lung cancer because of a history of smoking. A yearly low-dose CT scan of the lungs is recommended for people who have at least a 30-pack-year history of smoking and are a current smoker or have quit within the past 15 years. A pack year of smoking is smoking an average of 1 pack of cigarettes a day for 1 year (for example: 1 pack a day for 30 years or 2 packs a day for 15 years). Yearly screening should continue until the smoker has stopped smoking for at least 15 years. Yearly screening should be stopped for people who develop a health problem that would prevent them from having lung cancer treatment.  If you are pregnant, do not drink alcohol. If you are breastfeeding,  be very cautious about drinking alcohol. If you are not pregnant and choose to drink alcohol, do not have more than 1 drink per day. One drink is considered to be 12 ounces (355 mL) of beer, 5 ounces (148 mL) of wine, or 1.5 ounces (44 mL) of liquor.  Avoid use of street drugs. Do not share needles with anyone. Ask for help if you need support or instructions about stopping the use of drugs.  High blood pressure causes heart disease and increases the risk of  stroke. Your blood pressure should be checked at least every 1 to 2 years. Ongoing high blood pressure should be treated with medicines if weight loss and exercise do not work.  If you are 3-86 years old, ask your health care provider if you should take aspirin to prevent strokes.  Diabetes screening involves taking a blood sample to check your fasting blood sugar level. This should be done once every 3 years, after age 67, if you are within normal weight and without risk factors for diabetes. Testing should be considered at a younger age or be carried out more frequently if you are overweight and have at least 1 risk factor for diabetes.  Breast cancer screening is essential preventive care for women. You should practice "breast self-awareness." This means understanding the normal appearance and feel of your breasts and may include breast self-examination. Any changes detected, no matter how small, should be reported to a health care provider. Women in their 8s and 30s should have a clinical breast exam (CBE) by a health care provider as part of a regular health exam every 1 to 3 years. After age 70, women should have a CBE every year. Starting at age 25, women should consider having a mammogram (breast X-ray test) every year. Women who have a family history of breast cancer should talk to their health care provider about genetic screening. Women at a high risk of breast cancer should talk to their health care providers about having an MRI and a mammogram every year.  Breast cancer gene (BRCA)-related cancer risk assessment is recommended for women who have family members with BRCA-related cancers. BRCA-related cancers include breast, ovarian, tubal, and peritoneal cancers. Having family members with these cancers may be associated with an increased risk for harmful changes (mutations) in the breast cancer genes BRCA1 and BRCA2. Results of the assessment will determine the need for genetic counseling and  BRCA1 and BRCA2 testing.  Routine pelvic exams to screen for cancer are no longer recommended for nonpregnant women who are considered low risk for cancer of the pelvic organs (ovaries, uterus, and vagina) and who do not have symptoms. Ask your health care provider if a screening pelvic exam is right for you.  If you have had past treatment for cervical cancer or a condition that could lead to cancer, you need Pap tests and screening for cancer for at least 20 years after your treatment. If Pap tests have been discontinued, your risk factors (such as having a new sexual partner) need to be reassessed to determine if screening should be resumed. Some women have medical problems that increase the chance of getting cervical cancer. In these cases, your health care provider may recommend more frequent screening and Pap tests.  The HPV test is an additional test that may be used for cervical cancer screening. The HPV test looks for the virus that can cause the cell changes on the cervix. The cells collected during the Pap test can be  tested for HPV. The HPV test could be used to screen women aged 30 years and older, and should be used in women of any age who have unclear Pap test results. After the age of 30, women should have HPV testing at the same frequency as a Pap test.  Colorectal cancer can be detected and often prevented. Most routine colorectal cancer screening begins at the age of 50 years and continues through age 75 years. However, your health care provider may recommend screening at an earlier age if you have risk factors for colon cancer. On a yearly basis, your health care provider may provide home test kits to check for hidden blood in the stool. Use of a small camera at the end of a tube, to directly examine the colon (sigmoidoscopy or colonoscopy), can detect the earliest forms of colorectal cancer. Talk to your health care provider about this at age 50, when routine screening begins. Direct  exam of the colon should be repeated every 5-10 years through age 75 years, unless early forms of pre-cancerous polyps or small growths are found.  People who are at an increased risk for hepatitis B should be screened for this virus. You are considered at high risk for hepatitis B if:  You were born in a country where hepatitis B occurs often. Talk with your health care provider about which countries are considered high risk.  Your parents were born in a high-risk country and you have not received a shot to protect against hepatitis B (hepatitis B vaccine).  You have HIV or AIDS.  You use needles to inject street drugs.  You live with, or have sex with, someone who has hepatitis B.  You get hemodialysis treatment.  You take certain medicines for conditions like cancer, organ transplantation, and autoimmune conditions.  Hepatitis C blood testing is recommended for all people born from 1945 through 1965 and any individual with known risks for hepatitis C.  Practice safe sex. Use condoms and avoid high-risk sexual practices to reduce the spread of sexually transmitted infections (STIs). STIs include gonorrhea, chlamydia, syphilis, trichomonas, herpes, HPV, and human immunodeficiency virus (HIV). Herpes, HIV, and HPV are viral illnesses that have no cure. They can result in disability, cancer, and death.  You should be screened for sexually transmitted illnesses (STIs) including gonorrhea and chlamydia if:  You are sexually active and are younger than 24 years.  You are older than 24 years and your health care provider tells you that you are at risk for this type of infection.  Your sexual activity has changed since you were last screened and you are at an increased risk for chlamydia or gonorrhea. Ask your health care provider if you are at risk.  If you are at risk of being infected with HIV, it is recommended that you take a prescription medicine daily to prevent HIV infection. This is  called preexposure prophylaxis (PrEP). You are considered at risk if:  You are a heterosexual woman, are sexually active, and are at increased risk for HIV infection.  You take drugs by injection.  You are sexually active with a partner who has HIV.  Talk with your health care provider about whether you are at high risk of being infected with HIV. If you choose to begin PrEP, you should first be tested for HIV. You should then be tested every 3 months for as long as you are taking PrEP.  Osteoporosis is a disease in which the bones lose minerals and strength   with aging. This can result in serious bone fractures or breaks. The risk of osteoporosis can be identified using a bone density scan. Women ages 65 years and over and women at risk for fractures or osteoporosis should discuss screening with their health care providers. Ask your health care provider whether you should take a calcium supplement or vitamin D to reduce the rate of osteoporosis.  Menopause can be associated with physical symptoms and risks. Hormone replacement therapy is available to decrease symptoms and risks. You should talk to your health care provider about whether hormone replacement therapy is right for you.  Use sunscreen. Apply sunscreen liberally and repeatedly throughout the day. You should seek shade when your shadow is shorter than you. Protect yourself by wearing long sleeves, pants, a wide-brimmed hat, and sunglasses year round, whenever you are outdoors.  Once a month, do a whole body skin exam, using a mirror to look at the skin on your back. Tell your health care provider of new moles, moles that have irregular borders, moles that are larger than a pencil eraser, or moles that have changed in shape or color.  Stay current with required vaccines (immunizations).  Influenza vaccine. All adults should be immunized every year.  Tetanus, diphtheria, and acellular pertussis (Td, Tdap) vaccine. Pregnant women should  receive 1 dose of Tdap vaccine during each pregnancy. The dose should be obtained regardless of the length of time since the last dose. Immunization is preferred during the 27th-36th week of gestation. An adult who has not previously received Tdap or who does not know her vaccine status should receive 1 dose of Tdap. This initial dose should be followed by tetanus and diphtheria toxoids (Td) booster doses every 10 years. Adults with an unknown or incomplete history of completing a 3-dose immunization series with Td-containing vaccines should begin or complete a primary immunization series including a Tdap dose. Adults should receive a Td booster every 10 years.  Varicella vaccine. An adult without evidence of immunity to varicella should receive 2 doses or a second dose if she has previously received 1 dose. Pregnant females who do not have evidence of immunity should receive the first dose after pregnancy. This first dose should be obtained before leaving the health care facility. The second dose should be obtained 4-8 weeks after the first dose.  Human papillomavirus (HPV) vaccine. Females aged 13-26 years who have not received the vaccine previously should obtain the 3-dose series. The vaccine is not recommended for use in pregnant females. However, pregnancy testing is not needed before receiving a dose. If a female is found to be pregnant after receiving a dose, no treatment is needed. In that case, the remaining doses should be delayed until after the pregnancy. Immunization is recommended for any person with an immunocompromised condition through the age of 26 years if she did not get any or all doses earlier. During the 3-dose series, the second dose should be obtained 4-8 weeks after the first dose. The third dose should be obtained 24 weeks after the first dose and 16 weeks after the second dose.  Zoster vaccine. One dose is recommended for adults aged 60 years or older unless certain conditions are  present.  Measles, mumps, and rubella (MMR) vaccine. Adults born before 1957 generally are considered immune to measles and mumps. Adults born in 1957 or later should have 1 or more doses of MMR vaccine unless there is a contraindication to the vaccine or there is laboratory evidence of immunity to   each of the three diseases. A routine second dose of MMR vaccine should be obtained at least 28 days after the first dose for students attending postsecondary schools, health care workers, or international travelers. People who received inactivated measles vaccine or an unknown type of measles vaccine during 1963-1967 should receive 2 doses of MMR vaccine. People who received inactivated mumps vaccine or an unknown type of mumps vaccine before 1979 and are at high risk for mumps infection should consider immunization with 2 doses of MMR vaccine. For females of childbearing age, rubella immunity should be determined. If there is no evidence of immunity, females who are not pregnant should be vaccinated. If there is no evidence of immunity, females who are pregnant should delay immunization until after pregnancy. Unvaccinated health care workers born before 1957 who lack laboratory evidence of measles, mumps, or rubella immunity or laboratory confirmation of disease should consider measles and mumps immunization with 2 doses of MMR vaccine or rubella immunization with 1 dose of MMR vaccine.  Pneumococcal 13-valent conjugate (PCV13) vaccine. When indicated, a person who is uncertain of her immunization history and has no record of immunization should receive the PCV13 vaccine. An adult aged 19 years or older who has certain medical conditions and has not been previously immunized should receive 1 dose of PCV13 vaccine. This PCV13 should be followed with a dose of pneumococcal polysaccharide (PPSV23) vaccine. The PPSV23 vaccine dose should be obtained at least 8 weeks after the dose of PCV13 vaccine. An adult aged 19  years or older who has certain medical conditions and previously received 1 or more doses of PPSV23 vaccine should receive 1 dose of PCV13. The PCV13 vaccine dose should be obtained 1 or more years after the last PPSV23 vaccine dose.  Pneumococcal polysaccharide (PPSV23) vaccine. When PCV13 is also indicated, PCV13 should be obtained first. All adults aged 65 years and older should be immunized. An adult younger than age 65 years who has certain medical conditions should be immunized. Any person who resides in a nursing home or long-term care facility should be immunized. An adult smoker should be immunized. People with an immunocompromised condition and certain other conditions should receive both PCV13 and PPSV23 vaccines. People with human immunodeficiency virus (HIV) infection should be immunized as soon as possible after diagnosis. Immunization during chemotherapy or radiation therapy should be avoided. Routine use of PPSV23 vaccine is not recommended for American Indians, [place] Natives, or people younger than 65 years unless there are medical conditions that require PPSV23 vaccine. When indicated, people who have unknown immunization and have no record of immunization should receive PPSV23 vaccine. One-time revaccination 5 years after the first dose of PPSV23 is recommended for people aged 19-64 years who have chronic kidney failure, nephrotic syndrome, asplenia, or immunocompromised conditions. People who received 1-2 doses of PPSV23 before age 65 years should receive another dose of PPSV23 vaccine at age 65 years or later if at least 5 years have passed since the previous dose. Doses of PPSV23 are not needed for people immunized with PPSV23 at or after age 65 years.  Meningococcal vaccine. Adults with asplenia or persistent complement component deficiencies should receive 2 doses of quadrivalent meningococcal conjugate (MenACWY-D) vaccine. The doses should be obtained at least 2 months apart.  Microbiologists working with certain meningococcal bacteria, military recruits, people at risk during an outbreak, and people who travel to or live in countries with a high rate of meningitis should be immunized. A first-year college student up through age   21 years who is living in a residence hall should receive a dose if she did not receive a dose on or after her 16th birthday. Adults who have certain high-risk conditions should receive one or more doses of vaccine.  Hepatitis A vaccine. Adults who wish to be protected from this disease, have certain high-risk conditions, work with hepatitis A-infected animals, work in hepatitis A research labs, or travel to or work in countries with a high rate of hepatitis A should be immunized. Adults who were previously unvaccinated and who anticipate close contact with an international adoptee during the first 60 days after arrival in the [place] from a country with a high rate of hepatitis A should be immunized.  Hepatitis B vaccine. Adults who wish to be protected from this disease, have certain high-risk conditions, may be exposed to blood or other infectious body fluids, are household contacts or sex partners of hepatitis B positive people, are clients or workers in certain care facilities, or travel to or work in countries with a high rate of hepatitis B should be immunized.  Haemophilus influenzae type b (Hib) vaccine. A previously unvaccinated person with asplenia or sickle cell disease or having a scheduled splenectomy should receive 1 dose of Hib vaccine. Regardless of previous immunization, a recipient of a hematopoietic stem cell transplant should receive a 3-dose series 6-12 months after her successful transplant. Hib vaccine is not recommended for adults with HIV infection. Preventive Services / Frequency Ages 64 to 68 years  Blood pressure check.** / Every 1 to 2 years.  Lipid and cholesterol check.** / Every 5 years beginning at age  22.  Clinical breast exam.** / Every 3 years for women in their 88s and 53s.  BRCA-related cancer risk assessment.** / For women who have family members with a BRCA-related cancer (breast, ovarian, tubal, or peritoneal cancers).  Pap test.** / Every 2 years from ages 90 through 51. Every 3 years starting at age 21 through age 56 or 3 with a history of 3 consecutive normal Pap tests.  HPV screening.** / Every 3 years from ages 24 through ages 1 to 46 with a history of 3 consecutive normal Pap tests.  Hepatitis C blood test.** / For any individual with known risks for hepatitis C.  Skin self-exam. / Monthly.  Influenza vaccine. / Every year.  Tetanus, diphtheria, and acellular pertussis (Tdap, Td) vaccine.** / Consult your health care provider. Pregnant women should receive 1 dose of Tdap vaccine during each pregnancy. 1 dose of Td every 10 years.  Varicella vaccine.** / Consult your health care provider. Pregnant females who do not have evidence of immunity should receive the first dose after pregnancy.  HPV vaccine. / 3 doses over 6 months, if 72 and younger. The vaccine is not recommended for use in pregnant females. However, pregnancy testing is not needed before receiving a dose.  Measles, mumps, rubella (MMR) vaccine.** / You need at least 1 dose of MMR if you were born in 1957 or later. You may also need a 2nd dose. For females of childbearing age, rubella immunity should be determined. If there is no evidence of immunity, females who are not pregnant should be vaccinated. If there is no evidence of immunity, females who are pregnant should delay immunization until after pregnancy.  Pneumococcal 13-valent conjugate (PCV13) vaccine.** / Consult your health care provider.  Pneumococcal polysaccharide (PPSV23) vaccine.** / 1 to 2 doses if you smoke cigarettes or if you have certain conditions.  Meningococcal vaccine.** /  1 dose if you are age 19 to 21 years and a first-year college  student living in a residence hall, or have one of several medical conditions, you need to get vaccinated against meningococcal disease. You may also need additional booster doses.  Hepatitis A vaccine.** / Consult your health care provider.  Hepatitis B vaccine.** / Consult your health care provider.  Haemophilus influenzae type b (Hib) vaccine.** / Consult your health care provider. Ages 40 to 64 years  Blood pressure check.** / Every 1 to 2 years.  Lipid and cholesterol check.** / Every 5 years beginning at age 20 years.  Lung cancer screening. / Every year if you are aged 55-80 years and have a 30-pack-year history of smoking and currently smoke or have quit within the past 15 years. Yearly screening is stopped once you have quit smoking for at least 15 years or develop a health problem that would prevent you from having lung cancer treatment.  Clinical breast exam.** / Every year after age 40 years.  BRCA-related cancer risk assessment.** / For women who have family members with a BRCA-related cancer (breast, ovarian, tubal, or peritoneal cancers).  Mammogram.** / Every year beginning at age 40 years and continuing for as long as you are in good health. Consult with your health care provider.  Pap test.** / Every 3 years starting at age 30 years through age 65 or 70 years with a history of 3 consecutive normal Pap tests.  HPV screening.** / Every 3 years from ages 30 years through ages 65 to 70 years with a history of 3 consecutive normal Pap tests.  Fecal occult blood test (FOBT) of stool. / Every year beginning at age 50 years and continuing until age 75 years. You may not need to do this test if you get a colonoscopy every 10 years.  Flexible sigmoidoscopy or colonoscopy.** / Every 5 years for a flexible sigmoidoscopy or every 10 years for a colonoscopy beginning at age 50 years and continuing until age 75 years.  Hepatitis C blood test.** / For all people born from 1945 through  1965 and any individual with known risks for hepatitis C.  Skin self-exam. / Monthly.  Influenza vaccine. / Every year.  Tetanus, diphtheria, and acellular pertussis (Tdap/Td) vaccine.** / Consult your health care provider. Pregnant women should receive 1 dose of Tdap vaccine during each pregnancy. 1 dose of Td every 10 years.  Varicella vaccine.** / Consult your health care provider. Pregnant females who do not have evidence of immunity should receive the first dose after pregnancy.  Zoster vaccine.** / 1 dose for adults aged 60 years or older.  Measles, mumps, rubella (MMR) vaccine.** / You need at least 1 dose of MMR if you were born in 1957 or later. You may also need a 2nd dose. For females of childbearing age, rubella immunity should be determined. If there is no evidence of immunity, females who are not pregnant should be vaccinated. If there is no evidence of immunity, females who are pregnant should delay immunization until after pregnancy.  Pneumococcal 13-valent conjugate (PCV13) vaccine.** / Consult your health care provider.  Pneumococcal polysaccharide (PPSV23) vaccine.** / 1 to 2 doses if you smoke cigarettes or if you have certain conditions.  Meningococcal vaccine.** / Consult your health care provider.  Hepatitis A vaccine.** / Consult your health care provider.  Hepatitis B vaccine.** / Consult your health care provider.  Haemophilus influenzae type b (Hib) vaccine.** / Consult your health care provider. Ages 65   years and over  Blood pressure check.** / Every 1 to 2 years.  Lipid and cholesterol check.** / Every 5 years beginning at age 45 years.  Lung cancer screening. / Every year if you are aged 30-80 years and have a 30-pack-year history of smoking and currently smoke or have quit within the past 15 years. Yearly screening is stopped once you have quit smoking for at least 15 years or develop a health problem that would prevent you from having lung cancer  treatment.  Clinical breast exam.** / Every year after age 11 years.  BRCA-related cancer risk assessment.** / For women who have family members with a BRCA-related cancer (breast, ovarian, tubal, or peritoneal cancers).  Mammogram.** / Every year beginning at age 53 years and continuing for as long as you are in good health. Consult with your health care provider.  Pap test.** / Every 3 years starting at age 42 years through age 64 or 69 years with 3 consecutive normal Pap tests. Testing can be stopped between 65 and 70 years with 3 consecutive normal Pap tests and no abnormal Pap or HPV tests in the past 10 years.  HPV screening.** / Every 3 years from ages 10 years through ages 80 or 59 years with a history of 3 consecutive normal Pap tests. Testing can be stopped between 65 and 70 years with 3 consecutive normal Pap tests and no abnormal Pap or HPV tests in the past 10 years.  Fecal occult blood test (FOBT) of stool. / Every year beginning at age 67 years and continuing until age 41 years. You may not need to do this test if you get a colonoscopy every 10 years.  Flexible sigmoidoscopy or colonoscopy.** / Every 5 years for a flexible sigmoidoscopy or every 10 years for a colonoscopy beginning at age 7 years and continuing until age 53 years.  Hepatitis C blood test.** / For all people born from [birth year] through 1967 and any individual with known risks for hepatitis C.  Osteoporosis screening.** / A one-time screening for women ages 28 years and over and women at risk for fractures or osteoporosis.  Skin self-exam. / Monthly.  Influenza vaccine. / Every year.  Tetanus, diphtheria, and acellular pertussis (Tdap/Td) vaccine.** / 1 dose of Td every 10 years.  Varicella vaccine.** / Consult your health care provider.  Zoster vaccine.** / 1 dose for adults aged 58 years or older.  Pneumococcal 13-valent conjugate (PCV13) vaccine.** / Consult your health care provider.  Pneumococcal  polysaccharide (PPSV23) vaccine.** / 1 dose for all adults aged 44 years and older.  Meningococcal vaccine.** / Consult your health care provider.  Hepatitis A vaccine.** / Consult your health care provider.  Hepatitis B vaccine.** / Consult your health care provider.  Haemophilus influenzae type b (Hib) vaccine.** / Consult your health care provider. ** Family history and personal history of risk and conditions may change your health care provider's recommendations. Document Released: 09/13/2001 Document Revised: 12/02/2013 Document Reviewed: 12/13/2010 [facility] Patient Information 2015 [place], [place]. This information is not intended to replace advice given to you by your health care provider. Make sure you discuss any questions you have with your health care provider. Back Pain, Adult Low back pain is very common. About 1 in 5 people have back pain.The cause of low back pain is rarely dangerous. The pain often gets better over time.About half of people with a sudden onset of back pain feel better in just 2 weeks. About 8 in 10 people feel  better by 6 weeks.  CAUSES Some common causes of back pain include:  Strain of the muscles or ligaments supporting the spine.  Wear and tear (degeneration) of the spinal discs.  Arthritis.  Direct injury to the back. DIAGNOSIS Most of the time, the direct cause of low back pain is not known.However, back pain can be treated effectively even when the exact cause of the pain is unknown.Answering your caregiver's questions about your overall health and symptoms is one of the most accurate ways to make sure the cause of your pain is not dangerous. If your caregiver needs more information, he or she may order lab work or imaging tests (X-rays or MRIs).However, even if imaging tests show changes in your back, this usually does not require surgery. HOME CARE INSTRUCTIONS For many people, back pain returns.Since low back pain is rarely dangerous, it is  often a condition that people can learn to [facility] their own.   Remain active. It is stressful on the back to sit or stand in one place. Do not sit, drive, or stand in one place for more than 30 minutes at a time. Take short walks on level surfaces as soon as pain allows.Try to increase the length of time you walk each day.  Do not stay in bed.Resting more than 1 or 2 days can delay your recovery.  Do not avoid exercise or work.Your body is made to move.It is not dangerous to be active, even though your back may hurt.Your back will likely heal faster if you return to being active before your pain is gone.  Pay attention to your body when you bend and lift. Many people have less discomfortwhen lifting if they bend their knees, keep the load close to their bodies,and avoid twisting. Often, the most comfortable positions are those that put less stress on your recovering back.  Find a comfortable position to sleep. Use a firm mattress and lie on your side with your knees slightly bent. If you lie on your back, put a pillow under your knees.  Only take over-the-counter or prescription medicines as directed by your caregiver. Over-the-counter medicines to reduce pain and inflammation are often the most helpful.Your caregiver may prescribe muscle relaxant drugs.These medicines help dull your pain so you can more quickly return to your normal activities and healthy exercise.  Put ice on the injured area.  Put ice in a plastic bag.  Place a towel between your skin and the bag.  Leave the ice on for 15-20 minutes, 03-04 times a day for the first 2 to 3 days. After that, ice and heat may be alternated to reduce pain and spasms.  Ask your caregiver about trying back exercises and gentle massage. This may be of some benefit.  Avoid feeling anxious or stressed.Stress increases muscle tension and can worsen back pain.It is important to recognize when you are anxious or stressed and learn ways  to manage it.Exercise is a great option. SEEK MEDICAL CARE IF:  You have pain that is not relieved with rest or medicine.  You have pain that does not improve in 1 week.  You have new symptoms.  You are generally not feeling well. SEEK IMMEDIATE MEDICAL CARE IF:   You have pain that radiates from your back into your legs.  You develop new bowel or bladder control problems.  You have unusual weakness or numbness in your arms or legs.  You develop nausea or vomiting.  You develop abdominal pain.  You feel faint.  Document Released: 07/18/2005 Document Revised: 01/17/2012 Document Reviewed: 11/19/2013 [facility] Patient Information 2015 [place], [place]. This information is not intended to replace advice given to you by your health care provider. Make sure you discuss any questions you have with your health care provider.

## 2014-05-15 NOTE — Progress Notes (Signed)
Pre visit review using our clinic review tool, if applicable. No additional management support is needed unless otherwise documented below in the visit note. 

## 2014-05-15 NOTE — Progress Notes (Signed)
Subjective:    Patient ID: Emily Compton, female    DOB: 1956-03-18, 58 y.o.   MRN: [id number]  Back Pain This is a chronic problem. The current episode started more than 1 year ago. The problem occurs intermittently. The problem is unchanged. The pain is present in the lumbar spine. The pain does not radiate. The pain is at a severity of 6/10. The pain is moderate. The pain is worse during the day. The symptoms are aggravated by bending and position. Pertinent negatives include no abdominal pain, bladder incontinence, bowel incontinence, chest pain, dysuria, fever, headaches, leg pain, numbness, paresis, paresthesias, pelvic pain, perianal numbness, tingling, weakness or weight loss. She has tried analgesics for the symptoms. The treatment provided significant relief.      Review of Systems  Constitutional: Negative.  Negative for fever, chills, weight loss, diaphoresis, appetite change and fatigue.  HENT: Negative.  Negative for trouble swallowing and voice change.   Eyes: Negative.   Respiratory: Negative.  Negative for cough, choking, chest tightness, shortness of breath and stridor.   Cardiovascular: Negative.  Negative for chest pain, palpitations and leg swelling.  Gastrointestinal: Positive for constipation. Negative for nausea, abdominal pain, diarrhea, blood in stool, rectal pain and bowel incontinence.  Endocrine: Negative.   Genitourinary: Negative.  Negative for bladder incontinence, dysuria and pelvic pain.  Musculoskeletal: Positive for back pain. Negative for arthralgias, gait problem, joint swelling, myalgias, neck pain and neck stiffness.  Skin: Negative.  Negative for rash.  Allergic/Immunologic: Negative.   Neurological: Negative.  Negative for tingling, tremors, speech difficulty, weakness, numbness, headaches and paresthesias.  Hematological: Negative.  Negative for adenopathy. Does not bruise/bleed easily.  Psychiatric/Behavioral: Positive for sleep disturbance.  Negative for suicidal ideas, hallucinations, behavioral problems, confusion, self-injury, dysphoric mood, decreased concentration and agitation. The patient is nervous/anxious. The patient is not hyperactive.        Objective:   Physical Exam  Vitals reviewed. Constitutional: She is oriented to person, place, and time. She appears well-developed and well-nourished. No distress.  HENT:  Head: Normocephalic and atraumatic.  Mouth/Throat: Oropharynx is clear and moist. No oropharyngeal exudate.  Eyes: Conjunctivae are normal. Right eye exhibits no discharge. Left eye exhibits no discharge. No scleral icterus.  Neck: Normal range of motion. Neck supple. No JVD present. No tracheal deviation present. No thyromegaly present.  Cardiovascular: Normal rate, regular rhythm, normal heart sounds and intact distal pulses.  Exam reveals no gallop and no friction rub.   No murmur heard. Pulmonary/Chest: Effort normal and breath sounds normal. No stridor. No respiratory distress. She has no wheezes. She has no rales. She exhibits no tenderness.  Abdominal: Soft. Bowel sounds are normal. She exhibits no distension and no mass. There is no tenderness. There is no rebound and no guarding.  Musculoskeletal: Normal range of motion. She exhibits no edema and no tenderness.       Lumbar back: Normal. She exhibits normal range of motion, no tenderness, no bony tenderness, no swelling, no edema, no deformity, no laceration, no pain, no spasm and normal pulse.  Lymphadenopathy:    She has no cervical adenopathy.  Neurological: She is alert and oriented to person, place, and time. She has normal strength. She displays no atrophy, no tremor and normal reflexes. No cranial nerve deficit or sensory deficit. She exhibits normal muscle tone. She displays a negative Romberg sign. She displays no seizure activity. Coordination and gait normal.  Neg SLR in BLE  Skin: Skin is warm and dry. No rash  noted. She is not diaphoretic.  No erythema. No pallor.  Psychiatric: She has a normal mood and affect. Her behavior is normal. Judgment and thought content normal.     Lab Results  Component Value Date   WBC 3.0* 11/15/2013   HGB 11.9 11/15/2013   HCT 36.5 11/15/2013   PLT 181 11/15/2013   GLUCOSE 93 05/12/2014   CHOL 268* 02/25/2013   TRIG 60.0 02/25/2013   HDL 90.60 02/25/2013   LDLDIRECT 157.5 02/25/2013   ALT 37 07/01/2013   AST 32 07/01/2013   NA 143 05/12/2014   K 4.2 05/12/2014   CL 104 02/25/2013   CREATININE 0.8 05/12/2014   BUN 11.0 05/12/2014   CO2 28 05/12/2014   TSH 2.56 02/25/2013   INR 0.95 02/24/2011       Assessment & Plan:

## 2014-05-16 ENCOUNTER — Telehealth: Payer: Self-pay | Admitting: *Deleted

## 2014-05-16 NOTE — Telephone Encounter (Signed)
Received message from pt requesting CT scan results.  Reviewed by Selena Lesser, NP; left message X2 @ home and cell # to call office for result; per Selena Lesser, NP CT is stable.

## 2014-05-16 NOTE — Assessment & Plan Note (Signed)
She will cont percocet as needed

## 2014-05-16 NOTE — Assessment & Plan Note (Signed)
Cont valium as needed

## 2014-05-16 NOTE — Assessment & Plan Note (Signed)
Exam done  Recent mammo and colonoscopy reviewed Vaccines were updated Labs ordered Pt ed material was given

## 2014-05-16 NOTE — Assessment & Plan Note (Signed)
She is due for a DEXA scan 

## 2014-05-19 ENCOUNTER — Other Ambulatory Visit (INDEPENDENT_AMBULATORY_CARE_PROVIDER_SITE_OTHER): Payer: Self-pay | Admitting: General Surgery

## 2014-05-19 ENCOUNTER — Other Ambulatory Visit (INDEPENDENT_AMBULATORY_CARE_PROVIDER_SITE_OTHER): Payer: Self-pay | Admitting: *Deleted

## 2014-05-19 ENCOUNTER — Other Ambulatory Visit (INDEPENDENT_AMBULATORY_CARE_PROVIDER_SITE_OTHER): Payer: Self-pay

## 2014-05-19 DIAGNOSIS — C210 Malignant neoplasm of anus, unspecified: Secondary | ICD-10-CM

## 2014-05-20 ENCOUNTER — Ambulatory Visit (INDEPENDENT_AMBULATORY_CARE_PROVIDER_SITE_OTHER)
Admission: RE | Admit: 2014-05-20 | Discharge: 2014-05-20 | Disposition: A | Discharge status: Home / Self Care | Payer: BC Managed Care – PPO | Source: Ambulatory Visit | Attending: Internal Medicine | Admitting: Internal Medicine

## 2014-05-20 ENCOUNTER — Telehealth: Payer: Self-pay | Admitting: *Deleted

## 2014-05-20 DIAGNOSIS — M8580 Other specified disorders of bone density and structure, unspecified site: Secondary | ICD-10-CM

## 2014-05-20 NOTE — Telephone Encounter (Signed)
Called to follow up on CT results-she had seen her PCP and Dr. Ronnald Ramp gave her results. She is asking about a pelvis/chest CT scan and why it was not ordered? Made her aware that MD was just following the prior liver lesion. She saw Dr. Leighton Ruff of recent and she has ordered a CT chest/pelvis for 05/26/14 at [facility]. Will look out for results.

## 2014-05-21 ENCOUNTER — Other Ambulatory Visit: Payer: Self-pay | Admitting: Internal Medicine

## 2014-05-21 ENCOUNTER — Encounter: Payer: Self-pay | Admitting: Internal Medicine

## 2014-05-21 DIAGNOSIS — M818 Other osteoporosis without current pathological fracture: Secondary | ICD-10-CM

## 2014-05-21 LAB — HM DEXA SCAN: HM DEXA SCAN: -3

## 2014-05-26 ENCOUNTER — Ambulatory Visit
Admission: RE | Admit: 2014-05-26 | Discharge: 2014-05-26 | Disposition: A | Discharge status: Home / Self Care | Payer: BC Managed Care – PPO | Source: Ambulatory Visit | Attending: General Surgery | Admitting: General Surgery

## 2014-05-26 DIAGNOSIS — C210 Malignant neoplasm of anus, unspecified: Secondary | ICD-10-CM

## 2014-05-26 MED ORDER — IOHEXOL 300 MG/ML  SOLN
100.0000 mL | Freq: Once | INTRAMUSCULAR | Status: AC | PRN
  Administered 2014-05-26: 100 mL via INTRAVENOUS

## 2014-05-27 ENCOUNTER — Ambulatory Visit (INDEPENDENT_AMBULATORY_CARE_PROVIDER_SITE_OTHER): Payer: BC Managed Care – PPO | Admitting: General Surgery

## 2014-06-19 ENCOUNTER — Ambulatory Visit: Payer: BC Managed Care – PPO | Admitting: Internal Medicine

## 2014-07-17 ENCOUNTER — Telehealth: Payer: Self-pay | Admitting: Oncology

## 2014-07-17 ENCOUNTER — Ambulatory Visit (HOSPITAL_BASED_OUTPATIENT_CLINIC_OR_DEPARTMENT_OTHER): Payer: BC Managed Care – PPO | Admitting: Oncology

## 2014-07-17 VITALS — BP 100/61 | HR 72 | Temp 98.8°F | Resp 18 | Ht 60.0 in | Wt 122.4 lb

## 2014-07-17 DIAGNOSIS — C210 Malignant neoplasm of anus, unspecified: Secondary | ICD-10-CM

## 2014-07-17 DIAGNOSIS — Z85048 Personal history of other malignant neoplasm of rectum, rectosigmoid junction, and anus: Secondary | ICD-10-CM

## 2014-07-17 NOTE — Progress Notes (Signed)
  [facility] OFFICE PROGRESS NOTE   Diagnosis: Anal cancer  INTERVAL HISTORY:   Emily Compton returns as scheduled. She feels well. She has discomfort and bleeding when she has a firm stool. She reports undergoing a rectal evaluation by Dr. Marcello Moores in October. She is scheduled to see Dr. Marcello Moores again in February. She was seen in GYN oncology for an abnormal Pap smear in August. A Pap smear 03/21/2014 revealed atypical squamous cells of undetermined significance.  Objective:  Vital signs in last 24 hours:  Blood pressure 100/61, pulse 72, temperature 98.8 F (37.1 C), temperature source Oral, resp. rate 18, height 5' (1.524 m), weight 122 lb 6.4 oz (55.52 kg), SpO2 100 %.    HEENT: Neck without mass Lymphatics: No cervical, supraclavicular, inguinal, or femoral nodes. Pea-sized mobile bilateral axillary nodes, tender on the right. Resp: Lungs clear bilaterally Cardio: Regular rate and rhythm GI: No hepatomegaly, nontender, no mass (he she declined a rectal examination due to the upcoming appointment with Dr. Marcello Moores Vascular: No leg edema Breast: Right breast without mass   Imaging:  CTs of the chest and pelvis 05/26/2014-no evidence of metastatic disease in the chest, no pathologically enlarged lymph nodes in the pelvis. Patchy sclerosis throughout the sacrum and left superior pubic ramus is new.  Medications: I have reviewed the patient's current medications.  Assessment/Plan: 1. Squamous cell carcinoma of the anus, clinical stage II (T2 N0); staging PET scan 05/20/2013 with a solitary hypermetabolic focus in the anal canal and no evidence of local or distant metastatic disease. Radiation initiated 05/27/2013. Cycle 1 5-FU/mitomycin C. beginning 05/27/2013. Cycle 2 5-FU/mitomycin C. beginning 07/01/2013. Completion of radiation 07/05/2013. Anoscopy 11/26/2013 with no signs of tumor. Scar noted left lateral anal sidewall. 2. History of hemorrhoids. 3. Rectal bleeding  secondary to #1. Resolved. 4. Oral mucositis following cycle 1 5-FU/mitomycin C. Resolved. 5. Skin toxicity at the labia/perineum/perianal region related to radiation and chemotherapy. Resolved. 6. Pap smear 10/21/2013. High-grade squamous intraepithelial lesion. She was evaluated by GYN oncology   Disposition:  Emily Compton remains in clinical remission from anal cancer. She will continue anal examinations with Dr. Marcello Moores. She will return for an office visit here in 8 months. I encouraged her to follow-up with the pelvic physical therapy program as needed. She will continue follow-up with her gynecologist or GYN oncology for the abnormal Pap smear.  Betsy Coder, MD  07/17/2014  11:47 AM

## 2014-07-17 NOTE — Telephone Encounter (Signed)
Pt confirmed MD visit per 12/17 POF, gave pt AVS.... KJ

## 2014-07-29 ENCOUNTER — Ambulatory Visit (INDEPENDENT_AMBULATORY_CARE_PROVIDER_SITE_OTHER): Payer: BC Managed Care – PPO | Admitting: Internal Medicine

## 2014-07-29 ENCOUNTER — Encounter: Payer: Self-pay | Admitting: Internal Medicine

## 2014-07-29 VITALS — BP 102/60 | HR 78 | Temp 98.0°F | Resp 12 | Ht 60.5 in | Wt 120.4 lb

## 2014-07-29 DIAGNOSIS — M818 Other osteoporosis without current pathological fracture: Secondary | ICD-10-CM

## 2014-07-29 LAB — BASIC METABOLIC PANEL
BUN: 16 mg/dL (ref 6–23)
CHLORIDE: 104 meq/L (ref 96–112)
CO2: 30 mEq/L (ref 19–32)
Calcium: 9.5 mg/dL (ref 8.4–10.5)
Creatinine, Ser: 0.8 mg/dL (ref 0.4–1.2)
GFR: 78.76 mL/min (ref >60.00)
GLUCOSE: 86 mg/dL (ref 70–99)
POTASSIUM: 4.6 meq/L (ref 3.5–5.1)
Sodium: 140 mEq/L (ref 135–145)

## 2014-07-29 NOTE — Progress Notes (Signed)
Patient ID: Emily Compton, female   DOB: 06/23/1956, 58 y.o.   MRN: [id number]   HPI  Dior Dominik is a 58 y.o.-year-old female, referred by her PCP, Dr. Ronnald Ramp, for management of osteoporosis.  Pt was dx with OP in 2015. She denies any fractures or falls. No dizziness/vertigo/orthostasis.  I reviewed pt's DEXA scans: Date L1-L3 T score FN T score 33% distal Radius  05/21/2014  - 3.0 L4 vb excluded b/c instrumentation - fusion L4-L5 in 03/2011 RFN: - 2.7 LFN: - 3.1 n/a  She has a h/o steroid injections in back: ~ 10 inj over 3 years up to 2013.  She has not been on OP treatments.  No h/o hyper/hypocalcemia. No h/o hyperparathyroidism. No h/o kidney stones. Lab Results  Component Value Date   CALCIUM 9.3 05/15/2014   CALCIUM 9.9 05/12/2014   CALCIUM 9.3 07/01/2013   CALCIUM 9.8 05/20/2013   CALCIUM 9.4 02/25/2013   CALCIUM 10.4 02/24/2011   CALCIUM 9.8 09/23/2010   CALCIUM 10.6* 05/19/2010   CALCIUM 10.2 04/23/2009   No h/o thyrotoxicosis. Reviewed TSH recent levels:  Lab Results  Component Value Date   TSH 2.60 05/15/2014   TSH 2.56 02/25/2013   TSH 1.45 05/19/2010   No h/o vitamin D deficiency. Last vit D level was 40.24 in 05/15/2014. She started to take supplements 8 mo ago. She is on vitamin D 1000 units daily + started 2 weeks ago a MVI. Pt is not taking calcium except from MVI. She eats dairy (almond milk) and green, leafy, vegetables (spinach) - has a smoothie daily.   No h/o CKD. Last BUN/Cr: Lab Results  Component Value Date   BUN 10 05/15/2014   CREATININE 0.8 05/15/2014   + weight bearing exercises. She walks 3 miles a day >> 45 min.  She does not take high vitamin A doses.  Pt does not have a FH of osteoporosis.   I reviewed her chart and she also has a history of anal cancer, s/p pelvic RxTx and ChTx. She had 2 Dexamethasone infusions during chemotherapy.  Menopause was at 58 y/o.   ROS: Constitutional: no weight gain/loss, no fatigue, no  subjective hyperthermia/hypothermia, + poor sleep Eyes: no blurry vision, no xerophthalmia ENT: no sore throat, no nodules palpated in throat, no dysphagia/odynophagia, no hoarseness Cardiovascular: no CP/SOB/palpitations/leg swelling Respiratory: no cough/SOB Gastrointestinal: no N/V/+ D/+ C Musculoskeletal: no muscle/joint aches Skin: no rashes Neurological: no tremors/numbness/tingling/dizziness Psychiatric: no depression/anxiety + low libido  Past Medical History  Diagnosis Date  . Dental crowns present     and caps  . GERD (gastroesophageal reflux disease)     prn OTC  . Trigger middle finger of right hand 12/2011  . Cancer 04/30/13    anal =poorly diff squamous cell ca   . History of radiation therapy 05/27/13-07/05/13    anal canal/50.4Gy/42fx   Past Surgical History  Procedure Laterality Date  . Tubal ligation    . Tonsillectomy  age 31  . Lumbar fusion  03/02/2011    right transforaminal lumbar fusion L4-5; left posterolateral fusion L4-5  . Trigger finger release  01/12/2012    Procedure: RELEASE TRIGGER FINGER/A-1 PULLEY;  Surgeon: Tennis Must, MD;  Location: [facility];  Service: Orthopedics;  Laterality: Right;  right long trigger release  . Hemorrhoid banding  08/29/2012    Procedure: HEMORRHOID BANDING;  Surgeon: Inda Castle, MD;  Location: [facility];  Service: Endoscopy;  Laterality: N/A;  . Flexible sigmoidoscopy  08/29/2012  Procedure: FLEXIBLE SIGMOIDOSCOPY;  Surgeon: Inda Castle, MD;  Location: [facility];  Service: Endoscopy;  Laterality: N/A;   History   Occupational History - now retired  . office     middle school  .  [place] History Main Topics  . Smoking status: Never Smoker   . Smokeless tobacco: Never Used  . Alcohol Use: Beer - 5-6x a week     Comment: occasionally  . 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 cholecalciferol (VITAMIN D) 1000 UNITS tablet Take 1,000 Units by mouth daily.    . diazepam (VALIUM) 10 MG tablet Take 1 tablet (10 mg total) by mouth every 12 (twelve) hours as needed for anxiety. 60 tablet 3  . docusate sodium (COLACE) 100 MG capsule Take 300 mg by mouth daily. Uses Dulcolax brand softener    . FeCbn-FA-B Complex-A-C-D-E-Min (ACTIVE FE PO) Take 1 tablet by mouth daily.    Marland Kitchen lactobacillus acidophilus (BACID) TABS tablet Take 1 tablet by mouth daily.     Marland Kitchen omeprazole (PRILOSEC OTC) 20 MG tablet Take 20 mg by mouth daily. Take one tablet as needed for acid reflux     . oxyCODONE-acetaminophen (PERCOCET/ROXICET) 5-325 MG per tablet Take 1 tablet by mouth every 4 (four) hours as needed for severe pain. (Patient not taking: Reported on 07/17/2014) 75 tablet 0   No current facility-administered medications on file prior to visit.   Allergies  Allergen Reactions  . Adhesive [Tape] Rash   Family History  Problem Relation Age of Onset  . Breast cancer Mother   . Uterine cancer Mother   . Brain cancer Mother   . Cancer Mother     breast & uterin & brain  . Colon cancer Neg Hx    PE: BP 102/60 mmHg  Pulse 78  Temp(Src) 98 F (36.7 C) (Oral)  Resp 12  Ht 5' 0.5" (1.537 m)  Wt 120 lb 6.4 oz (54.613 kg)  BMI 23.12 kg/m2  SpO2 98% Wt Readings from Last 3 Encounters:  07/29/14 120 lb 6.4 oz (54.613 kg)  07/17/14 122 lb 6.4 oz (55.52 kg)  05/15/14 119 lb (53.978 kg)   Constitutional: Normal weight, in NAD. No kyphosis. Eyes: PERRLA, EOMI, no exophthalmos ENT: moist mucous membranes, no thyromegaly, no cervical lymphadenopathy Cardiovascular: RRR, No MRG Respiratory: CTA B Gastrointestinal: abdomen soft, NT, ND, BS+ Musculoskeletal: no deformities, strength intact in all 4 Skin: moist, warm, no rashes Neurological: no tremor with outstretched hands, DTR normal in all  4  Assessment: 1. Osteoporosis  Plan: 1. Osteoporosis - likely postmenopausal +/- steroid induced (had steroid injections in back) - we reviewed the images and the DEXA report from 05/2014 >> explained BMD categories >> Discussed about increased risk of fracture, depending on the T score, greatly increased when the T score is lower than -2.5, but it is actually a continuum and -2.5 should not be regarded as an absolute threshold >> I explained that based on the T scores, she has an increased risk for fractures.  - We discussed about the different medication classes, benefits and side effects (including atypical fractures and ONJ - no dental workup in progress or planned). Due to her low T-scores, my first choice would be IV bisphosphonate (zoledronic acid = Reclast)  or, better, sq denosumab (Prolia). I don't think she needs Teriparatide at the moment. Pt was given reading information about these, and I explained the mechanism of action and expected benefits.  - we reviewed her dietary and supplemental calcium and vitamin D intake, which I believe are adequate. Pt ends up getting at least 1200 units vitamin D and at least 1000 mg of calcium daily. I advised her to continue this - given her specific instructions about food sources for these - see pt instructions  - discussed fall precautions  - given handout from [place] [facility]: weight bearing exercises - advised to do this every day or at least 5/7 days - we discussed about different possible directions for management. We discussed about the benefits of an alkaline diet, and I advised herto try to reduce the animal proteins, while maintaining a good amount of protein in her diet. The recommended daily protein intake is ~0.8 g per kilogram per day. I advised her to try to aim for this amount, since a diet low in proteins can exacerbate osteoporosis. Also, avoid smoking or >2 drinks of alcohol a day. - We will check a BMP today -  will check a new DEXA scan in 2 years after starting tx - will see pt back in a year  - time spent with the patient: 1 hour, of which >50% was spent in obtaining information about her symptoms, reviewing her previous labs, evaluations, and treatments, counseling her about her condition (please see the discussed topics above), and developing a plan to further investigate it. She had a number of questions which I addressed.    Chemistry      Component Value Date/Time   NA 140 07/29/2014 1212   NA 143 05/12/2014 0855   K 4.6 07/29/2014 1212   K 4.2 05/12/2014 0855   CL 104 07/29/2014 1212   CO2 30 07/29/2014 1212   CO2 28 05/12/2014 0855   BUN 16 07/29/2014 1212   BUN 11.0 05/12/2014 0855   CREATININE 0.8 07/29/2014 1212   CREATININE 0.8 05/12/2014 0855      Component Value Date/Time   CALCIUM 9.5 07/29/2014 1212   CALCIUM 9.9 05/12/2014 0855   ALKPHOS 60 05/15/2014 1145   ALKPHOS 54 07/01/2013 0948   AST 21 05/15/2014 1145   AST 32 07/01/2013 0948   ALT 18 05/15/2014 1145   ALT 37 07/01/2013 0948   BILITOT 0.5 05/15/2014 1145   BILITOT 0.34 07/01/2013 0948

## 2014-07-29 NOTE — Patient Instructions (Signed)
Please stop at the lab. Please make sure you get ~1000 mg daily of calcium. Please review the below information regarding Prolia and Reclast. Let me know if you decide for any of them >> Prolia is best.  How Can I Prevent Falls? Men and women with osteoporosis need to take care not to fall down. Falls can break bones. Some reasons people fall are: Poor vision  Poor balance  Certain diseases that affect how you walk  Some types of medicine, such as sleeping pills.  Some tips to help prevent falls outdoors are: Use a cane or walker  Wear rubber-soled shoes so you don't slip  Walk on grass when sidewalks are slippery  In winter, put salt or kitty litter on icy sidewalks.  Some ways to help prevent falls indoors are: Keep rooms free of clutter, especially on floors  Use plastic or carpet runners on slippery floors  Wear low-heeled shoes that provide good support  Do not walk in socks, stockings, or slippers  Be sure carpets and area rugs have skid-proof backs or are tacked to the floor  Be sure stairs are well lit and have rails on both sides  Put grab bars on bathroom walls near tub, shower, and toilet  Use a rubber bath mat in the shower or tub  Keep a flashlight next to your bed  Use a sturdy step stool with a handrail and wide steps  Add more lights in rooms (and night lights) Buy a cordless phone to keep with you so that you don't have to rush to the phone       when it rings and so that you can call for help if you fall.   (adapted from http://www.niams.NightlifePreviews.se)  Dietary sources of calcium and vitamin D:  Calcium content (mg) - http://www.niams.MoviePins.co.za  Fortified oatmeal, 1 packet 350  Sardines, canned in oil, with edible bones, 3 oz. 324  Cheddar cheese, 1 oz. shredded 306  Milk, nonfat, 1 cup 302  Milkshake, 1 cup 300  Yogurt, plain, low-fat, 1 cup 300  Soybeans, cooked, 1 cup 261  Tofu,  firm, with calcium,  cup 204  Orange juice, fortified with calcium, 6 oz. 200-260 (varies)  Salmon, canned, with edible bones, 3 oz. 181  Pudding, instant, made with 2% milk,  cup 153  Baked beans, 1 cup [place], 1% milk fat, 1 cup 138  Spaghetti, lasagna, 1 cup 125  Frozen yogurt, vanilla, soft-serve,  cup 103  Ready-to-eat cereal, fortified with calcium, 1 cup 100-1,000 (varies)  Cheese pizza, 1 slice 341  Fortified waffles, 2 100  Turnip greens, boiled,  cup 99  Broccoli, raw, 1 cup 90  Ice cream, vanilla,  cup 85  Soy or rice milk, fortified with calcium, 1 cup 80-500 (varies)   Vitamin D content (International Units, IU) - https://www.ars.usda.gov Cod liver oil, 1 tablespoon 1,360  Swordfish, cooked, 3 oz 566  Salmon (sockeye), cooked, 3 oz 447  Tuna fish, canned in water, drained, 3 oz 154  Orange juice fortified with vitamin D, 1 cup (check product labels, as amount of added vitamin D varies) 137  Milk, nonfat, reduced fat, and whole, vitamin D-fortified, 1 cup 115-124  Yogurt, fortified with 20% of the daily value for vitamin D, 6 oz 80  Margarine, fortified, 1 tablespoon 60  Sardines, canned in oil, drained, 2 sardines 46  Liver, beef, cooked, 3 oz 42  Egg, 1 large (vitamin D is found in yolk) 41  Ready-to-eat cereal,  fortified with 10% of the daily value for vitamin D, 0.75-1 cup  40  Cheese, Swiss, 1 oz 6   Exercise for Strong Bones (from [place]) There are two types of exercises that are important for building and maintaining bone density:  weight-bearing and muscle-strengthening exercises. Weight-bearing Exercises These exercises include activities that make you move against gravity while staying upright. Weight-bearing exercises can be high-impact or low-impact. High-impact weight-bearing exercises help build bones and keep them strong. If you have broken a bone due to osteoporosis or are at risk of breaking a bone, you may  need to avoid high-impact exercises. If youre not sure, you should check with your healthcare provider. Examples of high-impact weight-bearing exercises are:  Dancing  Doing high-impact aerobics  Hiking  Jogging/running  Jumping Rope  Stair climbing  Tennis Low-impact weight-bearing exercises can also help keep bones strong and are a safe alternative if you cannot do high-impact exercises. Examples of low-impact weight-bearing exercises are:  Using elliptical training machines  Doing low-impact aerobics  Using stair-step machines  Fast walking on a treadmill or outside Muscle-Strengthening Exercises These exercises include activities where you move your body, a weight or some other resistance against gravity. They are also known as resistance exercises and include:  Lifting weights  Using elastic exercise bands  Using weight machines  Lifting your own body weight  Functional movements, such as standing and rising up on your toes Yoga and Pilates can also improve strength, balance and flexibility. However, certain positions may not be safe for people with osteoporosis or those at increased risk of broken bones. For example, exercises that have you bend forward may increase the chance of breaking a bone in the spine. A physical therapist should be able to help you learn which exercises are safe and appropriate for you. Non-Impact Exercises Non-impact exercises can help you to improve balance, posture and how well you move in everyday activities. These exercises can also help to increase muscle strength and decrease the risk of falls and broken bones. Some of these exercises include:  Balance exercises that strengthen your legs and test your balance, such as Tai Chi, can decrease your risk of falls.  Posture exercises that improve your posture and reduce rounded or sloping shoulders can help you decrease the chance of breaking a bone, especially in the spine.  Functional  exercises that improve how well you move can help you with everyday activities and decrease your chance of falling and breaking a bone. For example, if you have trouble getting up from a chair or climbing stairs, you should do these activities as exercises. A physical therapist can teach you balance, posture and functional exercises. Starting a New Exercise Program If you havent exercised regularly for a while, check with your healthcare provider before beginning a new exercise program--particularly if you have health problems such as heart disease, diabetes or high blood pressure. If youre at high risk of breaking a bone, you should work with a physical therapist to develop a safe exercise program. Once you have your healthcare providers approval, start slowly. If youve already broken bones in the spine because of osteoporosis, be very careful to avoid activities that require reaching down, bending forward, rapid twisting motions, heavy lifting and those that increase your chance of a fall. As you get started, your muscles may feel sore for a day or two after you exercise. If soreness lasts longer, you may be working too hard and need to ease up. Exercises  should be done in a pain-free range of motion. How Much Exercise Do You Need? Weight-bearing exercises 30 minutes on most days of the week. Do a 30-minutesession or multiple sessions spread out throughout the day. The benefits to your bones are the same.   Muscle-strengthening exercises Two to three days per week. If you dont have much time for strengthening/resistance training, do small amounts at a time. You can do just one body part each day. For example do arms one day, legs the next and trunk the next. You can also spread these exercises out during your normal day.  Balance, posture and functional exercises Every day or as often as needed. You may want to focus on one area more than the others. If you have fallen or lose your balance, spend  time doing balance exercises. If you are getting rounded shoulders, work more on posture exercises. If you have trouble climbing stairs or getting up from the couch, do more functional exercises. You can also perform these exercises at one time or spread them during your day. Work with a phyiscal therapist to learn the right exercises for you.   Marland KitchenDenosumab: Patient drug information (Up-to-date) Copyright [phone] [place] rights reserved.  Brand Names: [place]  ProliaDelton See What is this drug used for?  It is used to treat soft, brittle bones (osteoporosis).  It is used for bone growth.  It is used when treating some cancers.  It may be given to you for other reasons. Talk with the doctor. What do I need to tell my doctor BEFORE I take this drug?  All products:  If you have an allergy to denosumab or any other part of this drug.  If you are allergic to any drugs like this one, any other drugs, foods, or other substances. Tell your doctor about the allergy and what signs you had, like rash; hives; itching; shortness of breath; wheezing; cough; swelling of face, lips, tongue, or throat; or any other signs.  If you have low calcium levels.  Prolia:  If you are pregnant or may be pregnant. Do not take this drug if you are pregnant.  This is not a list of all drugs or health problems that interact with this drug.  Tell your doctor and pharmacist about all of your drugs (prescription or OTC, natural products, vitamins) and health problems. You must check to make sure that it is safe for you to take this drug with all of your drugs and health problems. Do not start, stop, or change the dose of any drug without checking with your doctor. What are some things I need to know or do while I take this drug?  All products:  Tell dentists, surgeons, and other doctors that you use this drug.  This drug may raise the chance of a broken leg. Talk with your doctor.  Have your blood work  checked. Talk with your doctor.  Have a bone density test. Talk with your doctor.  Take calcium and vitamin D as you were told by your doctor.  Have a dental exam before starting this drug.  Take good care of your teeth. See a dentist often.  If you smoke, talk with your doctor.  Do not give to a child. Talk with your doctor.  Tell your doctor if you are breast-feeding. You will need to talk about any risks to your baby.  Delton See:  This drug may cause harm to the unborn baby if you take it while  you are pregnant. If you get pregnant while taking this drug, call your doctor right away.  Prolia:  Very bad infections have been reported with use of this drug. If you have any infection, are taking antibiotics now or in the recent past, or have many infections, talk with your doctor.  You may have more chance of getting an infection. Wash hands often. Stay away from people with infections, colds, or flu.  Use birth control that you can trust to prevent pregnancy while taking this drug.  If you are a man and your sex partner is pregnant or gets pregnant at any time while you are being treated, talk with your doctor. What are some side effects that I need to call my doctor about right away?  WARNING/CAUTION: Even though it may be rare, some people may have very bad and sometimes deadly side effects when taking a drug. Tell your doctor or get medical help right away if you have any of the following signs or symptoms that may be related to a very bad side effect:  All products:  Signs of an allergic reaction, like rash; hives; itching; red, swollen, blistered, or peeling skin with or without fever; wheezing; tightness in the chest or throat; trouble breathing or talking; unusual hoarseness; or swelling of the mouth, face, lips, tongue, or throat.  Signs of low calcium levels like muscle cramps or spasms, numbness and tingling, or seizures.  Mouth sores.  Any new or strange groin, hip, or  thigh pain.  This drug may cause jawbone problems. The chance may be higher the longer you take this drug. The chance may be higher if you have cancer, dental problems, dentures that do not fit well, anemia, blood clotting problems, or an infection. The chance may also be higher if you are having dental work or if you are getting chemo, some steroid drugs, or radiation. Call your doctor right away if you have jaw swelling or pain.  Xgeva:  Not hungry.  Muscle pain or weakness.  Seizures.  Shortness of breath.  Prolia:  Signs of infection. These include a fever of 100.71F (38C) or higher, chills, very bad sore throat, ear or sinus pain, cough, more sputum or change in color of sputum, pain with passing urine, mouth sores, wound that will not heal, or anal itching or pain.  Signs of a pancreas problem (pancreatitis) like very bad stomach pain, very bad back pain, or very bad upset stomach or throwing up.  Chest pain.  A heartbeat that does not feel normal.  Very bad skin irritation.  Feeling very tired or weak.  Bladder pain or pain when passing urine or change in how much urine is passed.  Passing urine often.  Swelling in the arms or legs. What are some other side effects of this drug?  All drugs may cause side effects. However, many people have no side effects or only have minor side effects. Call your doctor or get medical help if any of these side effects or any other side effects bother you or do not go away:  Xgeva:  Feeling tired or weak.  Headache.  Upset stomach or throwing up.  Loose stools (diarrhea).  Cough.  Prolia:  Back pain.  Muscle or joint pain.  Sore throat.  Runny nose.  Pain in arms or legs.  These are not all of the side effects that may occur. If you have questions about side effects, call your doctor. Call your doctor for medical advice about  side effects.  You may report side effects to your national health agency. How is this drug  best taken?  Use this drug as ordered by your doctor. Read and follow the dosing on the label closely.  It is given as a shot into the fatty part of the skin. What do I do if I miss a dose?  Call the doctor to find out what to do. How do I store and/or throw out this drug?  This drug will be given to you in a hospital or doctor's office. You will not store it at home.  Keep all drugs out of the reach of children and pets.  Check with your pharmacist about how to throw out unused drugs.  General drug facts  If your symptoms or health problems do not get better or if they become worse, call your doctor.  Do not share your drugs with others and do not take anyone else's drugs.  Keep a list of all your drugs (prescription, natural products, vitamins, OTC) with you. Give this list to your doctor.  Talk with the doctor before starting any new drug, including prescription or OTC, natural products, or vitamins.  Some drugs may have another patient information leaflet. If you have any questions about this drug, please talk with your doctor, pharmacist, or other health care provider.  If you think there has been an overdose, call your poison control center or get medical care right away. Be ready to tell or show what was taken, how much, and when it happened.  Zoledronic acid: Patient drug information (Up-to-Date) Copyright [phone] [place] rights reserved.  Brand Names: [place]  Reclast;  Zometa What is this drug used for?  It is used to treat high calcium levels.  It is used when treating some cancers.  It is used to treat Paget's disease.  It is used to put off or treat soft, brittle bones (osteoporosis).  It may be given to you for other reasons. Talk with the doctor. What do I need to tell my doctor BEFORE I take this drug?  All products:  If you have an allergy to zoledronic acid or any other part of this drug.  If you are allergic to any drugs like this one, any  other drugs, foods, or other substances. Tell your doctor about the allergy and what signs you had, like rash; hives; itching; shortness of breath; wheezing; cough; swelling of face, lips, tongue, or throat; or any other signs.  Reclast:  If you have low calcium levels.  If you have very bad kidney disease.  This is not a list of all drugs or health problems that interact with this drug.  Tell your doctor and pharmacist about all of your drugs (prescription or OTC, natural products, vitamins) and health problems. You must check to make sure that it is safe for you to take this drug with all of your drugs and health problems. Do not start, stop, or change the dose of any drug without checking with your doctor. What are some things I need to know or do while I take this drug?  All products:  Tell dentists, surgeons, and other doctors that you use this drug.  Worsening of asthma has happened in people taking drugs like this one. Talk with your doctor.  This drug may raise the chance of a broken leg. Talk with your doctor.  Have your blood work checked often. Talk with your doctor.  Have a bone density test.  Talk with your doctor.  Have a dental exam before starting this drug.  Take good care of your teeth. See a dentist often.  Do not give to a child. Talk with your doctor.  If you are 76 or older, use this drug with care. You could have more side effects.  This drug may cause harm to the unborn baby if you take it while you are pregnant.  Tell your doctor if you are pregnant or plan on getting pregnant. You will need to talk about the benefits and risks of using this drug while you are pregnant.  Tell your doctor if you are breast-feeding. You will need to talk about any risks to your baby.  Zometa:  Take calcium and vitamin D as you were told by your doctor.  Reclast:  This drug works best when used with calcium/vitamin D and weight-bearing workouts like walking or PT  (physical therapy).  Follow the diet and workout plan that your doctor told you about.  Use birth control that you can trust to prevent pregnancy while taking this drug. What are some side effects that I need to call my doctor about right away?  WARNING/CAUTION: Even though it may be rare, some people may have very bad and sometimes deadly side effects when taking a drug. Tell your doctor or get medical help right away if you have any of the following signs or symptoms that may be related to a very bad side effect:  Signs of an allergic reaction, like rash; hives; itching; red, swollen, blistered, or peeling skin with or without fever; wheezing; tightness in the chest or throat; trouble breathing or talking; unusual hoarseness; or swelling of the mouth, face, lips, tongue, or throat.  Signs of low calcium levels like muscle cramps or spasms, numbness and tingling, or seizures.  Signs of kidney problems like unable to pass urine, change in the amount of urine passed, blood in the urine, or a big weight gain.  Very bad bone, joint, or muscle pain.  Any new or strange groin, hip, or thigh pain.  Chest pain.  A heartbeat that does not feel normal.  Slow heartbeat.  Change in eyesight.  Eye pain.  Mouth sores.  Trouble swallowing.  Very bad pain when swallowing.  Any bruising or bleeding.  Pain where the shot was given.  Redness or swelling where the shot is given.  This drug may cause jawbone problems. The chance may be higher the longer you take this drug. The chance may be higher if you have cancer, dental problems, dentures that do not fit well, anemia, blood clotting problems, or an infection. The chance may also be higher if you are having dental work or if you are getting chemo, some steroid drugs, or radiation. Call your doctor right away if you have jaw swelling or pain. What are some other side effects of this drug?  All drugs may cause side effects. However, many  people have no side effects or only have minor side effects. Call your doctor or get medical help if any of these side effects or any other side effects bother you or do not go away:  All products:  Dizziness.  Upset stomach or throwing up.  Irritation where the shot is given.  Feeling tired or weak.  Belly pain.  Headache.  Flu-like signs.  Loose stools (diarrhea).  Muscle or joint pain.  Back pain.  Zometa:  Not able to sleep.  Not hungry.  Hard stools (constipation).  Weight loss.  Cough.  These are not all of the side effects that may occur. If you have questions about side effects, call your doctor. Call your doctor for medical advice about side effects.  You may report side effects to your national health agency. How is this drug best taken?  Use this drug as ordered by your doctor. Read and follow the dosing on the label closely.  All products:  It is given as a shot into a vein over a period of time.  Drink lots of noncaffeine liquids unless told to drink less liquid by your doctor.  Reclast:  Acetaminophen may be given to lower fever and chills.  Drink at least 2 glasses of liquids a few hours before you get this drug. What do I do if I miss a dose?  Call the doctor to find out what to do. How do I store and/or throw out this drug?  This drug will be given to you in a hospital or doctor's office. You will not store it at home.  Keep all drugs out of the reach of children and pets.  Check with your pharmacist about how to throw out unused drugs.  General drug facts  If your symptoms or health problems do not get better or if they become worse, call your doctor.  Do not share your drugs with others and do not take anyone else's drugs.  Keep a list of all your drugs (prescription, natural products, vitamins, OTC) with you. Give this list to your doctor.  Talk with the doctor before starting any new drug, including prescription or OTC, natural  products, or vitamins.  Some drugs may have another patient information leaflet. If you have any questions about this drug, please talk with your doctor, pharmacist, or other health care provider.  If you think there has been an overdose, call your poison control center or get medical care right away. Be ready to tell or show what was taken, how much, and when it happened.

## 2014-09-09 ENCOUNTER — Encounter: Payer: Self-pay | Admitting: *Deleted

## 2014-09-24 ENCOUNTER — Other Ambulatory Visit: Payer: Self-pay | Admitting: Internal Medicine

## 2014-09-24 NOTE — Telephone Encounter (Signed)
Rx faxed to [facility]

## 2014-10-22 ENCOUNTER — Other Ambulatory Visit: Payer: Self-pay | Admitting: Dermatology

## 2014-11-22 IMAGING — CT NM PET TUM IMG INITIAL (PI) SKULL BASE T - THIGH
1 of 4 series · 1 of 25 positions shown · IV contrast (350 OM)
Comparison: CT on 05/06/2013

CLINICAL DATA: Initial treatment strategy for anal carcinoma.

EXAM:
NUCLEAR MEDICINE PET SKULL BASE TO THIGH
FASTING BLOOD GLUCOSE:  Value:  84 mg/dl
TECHNIQUE: 17.38 mCi F-18 FDG was injected intravenously. CT data was obtained
and used for attenuation correction and anatomic localization only.
(This was not acquired as a diagnostic CT examination.) Additional
exam technical data entered on technologist worksheet.

[Series 2: ct images · axial · 3.8mm · 0.98mm/px · 1 of 267 slices shown]
[im 267/267  brain]
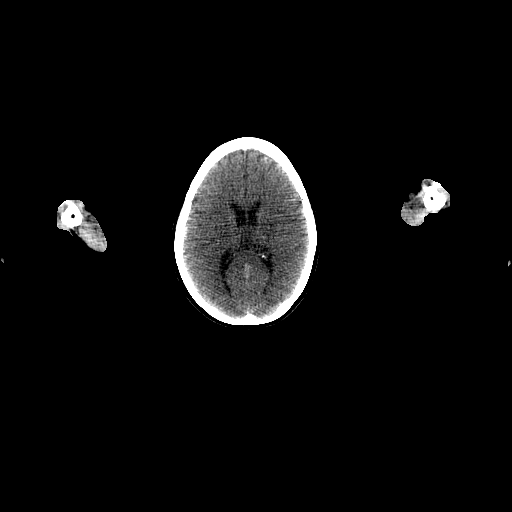

[1 of 25 positions shown; findings below may reference images not displayed]

FINDINGS: NECK

No hypermetabolic lymph nodes in the neck.

CHEST

No hypermetabolic mediastinal or hilar nodes. No suspicious
pulmonary nodules on the CT scan.

ABDOMEN/PELVIS

No abnormal hypermetabolic activity within the liver, pancreas,
adrenal glands, or spleen. No hypermetabolic lymph nodes in the
abdomen or pelvis. A hypermetabolic focus is seen in the inguinal
canal which has a maximum SUV of 8.5, and corresponds with known
history of primary anal carcinoma.

SKELETON

No focal hypermetabolic activity to suggest skeletal metastasis.
IMPRESSION: Solitary hypermetabolic focus in anal canal, consistent with known
primary anal carcinoma.

No evidence of local or distant metastatic disease.

## 2014-12-22 ENCOUNTER — Other Ambulatory Visit: Payer: Self-pay | Admitting: Obstetrics & Gynecology

## 2014-12-22 LAB — HM PAP SMEAR

## 2014-12-22 LAB — HM MAMMOGRAPHY

## 2014-12-23 LAB — CYTOLOGY - PAP

## 2014-12-30 ENCOUNTER — Telehealth: Payer: Self-pay | Admitting: Internal Medicine

## 2014-12-30 NOTE — Telephone Encounter (Signed)
Is requesting refill on oxycodone.   °

## 2014-12-30 NOTE — Telephone Encounter (Signed)
Pt last seen 07/29/14, must be seen every 3 mos. Thanks

## 2014-12-31 NOTE — Telephone Encounter (Signed)
Left message with husband.

## 2015-01-21 ENCOUNTER — Ambulatory Visit (INDEPENDENT_AMBULATORY_CARE_PROVIDER_SITE_OTHER): Payer: BLUE CROSS/BLUE SHIELD | Admitting: Internal Medicine

## 2015-01-21 ENCOUNTER — Encounter: Payer: Self-pay | Admitting: Internal Medicine

## 2015-01-21 VITALS — BP 100/80 | HR 59 | Temp 98.6°F | Resp 16 | Ht 60.0 in | Wt 124.5 lb

## 2015-01-21 DIAGNOSIS — M545 Low back pain, unspecified: Secondary | ICD-10-CM

## 2015-01-21 DIAGNOSIS — M5489 Other dorsalgia: Secondary | ICD-10-CM | POA: Diagnosis not present

## 2015-01-21 DIAGNOSIS — M818 Other osteoporosis without current pathological fracture: Secondary | ICD-10-CM | POA: Diagnosis not present

## 2015-01-21 DIAGNOSIS — K219 Gastro-esophageal reflux disease without esophagitis: Secondary | ICD-10-CM | POA: Diagnosis not present

## 2015-01-21 MED ORDER — OXYCODONE-ACETAMINOPHEN 5-325 MG PO TABS: 1.0000 | ORAL_TABLET | ORAL | Status: DC | PRN

## 2015-01-21 MED ORDER — DIAZEPAM 10 MG PO TABS
10.0000 mg | ORAL_TABLET | Freq: Two times a day (BID) | ORAL | Status: DC | PRN
Start: 2015-01-21 — End: 2015-01-29

## 2015-01-21 NOTE — Progress Notes (Signed)
Subjective:  Patient ID: Emily Compton, female    DOB: 04/07/1956  Age: 59 y.o. MRN: [id number]  CC: Back Pain   HPI Emily Compton presents for follow up on LBP that she describes as intermittent aching and spasm in her lower back, she rarely takes percocet and valium and she gets relief, she requests a refill today. The pain does not radiate and she denies any N/W/T in her LE's.  Outpatient Prescriptions Prior to Visit  Medication Sig Dispense Refill  . cholecalciferol (VITAMIN D) 1000 UNITS tablet Take 1,000 Units by mouth daily.    Marland Kitchen docusate sodium (COLACE) 100 MG capsule Take 300 mg by mouth daily. Uses Dulcolax brand softener    . FeCbn-FA-B Complex-A-C-D-E-Min (ACTIVE FE PO) Take 1 tablet by mouth daily.    Marland Kitchen lactobacillus acidophilus (BACID) TABS tablet Take 1 tablet by mouth daily.     Marland Kitchen omeprazole (PRILOSEC OTC) 20 MG tablet Take 20 mg by mouth daily. Take one tablet as needed for acid reflux     . diazepam (VALIUM) 10 MG tablet TAKE 1 TABLET BY MOUTH EVERY 12 HOURS AS NEEDED FOR ANXIETY 60 tablet 3  . oxyCODONE-acetaminophen (PERCOCET/ROXICET) 5-325 MG per tablet Take 1 tablet by mouth every 4 (four) hours as needed for severe pain. 75 tablet 0   No facility-administered medications prior to visit.    ROS Review of Systems  Constitutional: Negative.  Negative for fever, chills, diaphoresis, appetite change and fatigue.  HENT: Negative.  Negative for trouble swallowing and voice change.   Eyes: Negative.   Respiratory: Negative.  Negative for cough, choking, chest tightness, shortness of breath and stridor.   Cardiovascular: Negative.  Negative for chest pain and leg swelling.  Gastrointestinal: Negative.  Negative for nausea, vomiting, abdominal pain, diarrhea, constipation and blood in stool.  Endocrine: Negative.   Genitourinary: Negative.   Musculoskeletal: Positive for back pain. Negative for myalgias, joint swelling, arthralgias and neck pain.  Skin:  Negative.  Negative for rash.  Allergic/Immunologic: Negative.   Neurological: Negative.  Negative for dizziness, tremors, syncope, light-headedness, numbness and headaches.  Hematological: Negative.  Negative for adenopathy. Does not bruise/bleed easily.  Psychiatric/Behavioral: Negative.     Objective:  BP 100/80 mmHg  Pulse 59  Temp(Src) 98.6 F (37 C) (Oral)  Resp 16  Ht 5' (1.524 m)  Wt 124 lb 8 oz (56.473 kg)  BMI 24.31 kg/m2  SpO2 95%  BP Readings from Last 3 Encounters:  01/21/15 100/80  07/29/14 102/60  07/17/14 100/61    Wt Readings from Last 3 Encounters:  01/21/15 124 lb 8 oz (56.473 kg)  07/29/14 120 lb 6.4 oz (54.613 kg)  07/17/14 122 lb 6.4 oz (55.52 kg)    Physical Exam  Constitutional: She is oriented to person, place, and time. She appears well-developed and well-nourished. No distress.  HENT:  Head: Normocephalic and atraumatic.  Mouth/Throat: Oropharynx is clear and moist. No oropharyngeal exudate.  Eyes: Conjunctivae are normal. Right eye exhibits no discharge. Left eye exhibits no discharge. No scleral icterus.  Neck: Normal range of motion. Neck supple. No JVD present. No tracheal deviation present. No thyromegaly present.  Cardiovascular: Normal rate, regular rhythm, normal heart sounds and intact distal pulses.  Exam reveals no gallop and no friction rub.   No murmur heard. Pulmonary/Chest: Effort normal and breath sounds normal. No stridor. No respiratory distress. She has no wheezes. She has no rales. She exhibits no tenderness.  Abdominal: Soft. Bowel sounds are normal. She exhibits no  distension and no mass. There is no tenderness. There is no rebound and no guarding.  Musculoskeletal: Normal range of motion. She exhibits no edema or tenderness.       Lumbar back: Normal. She exhibits normal range of motion, no tenderness, no bony tenderness, no swelling, no edema, no deformity, no laceration, no pain, no spasm and normal pulse.    Lymphadenopathy:    She has no cervical adenopathy.  Neurological: She is alert and oriented to person, place, and time. She has normal strength. She displays no atrophy, no tremor and normal reflexes. No cranial nerve deficit or sensory deficit. She exhibits normal muscle tone. She displays a negative Romberg sign. She displays no seizure activity. Coordination and gait normal.  Reflex Scores:      Tricep reflexes are 1+ on the right side and 1+ on the left side.      Bicep reflexes are 1+ on the right side and 1+ on the left side.      Brachioradialis reflexes are 1+ on the right side and 1+ on the left side.      Patellar reflexes are 1+ on the right side and 1+ on the left side.      Achilles reflexes are 1+ on the right side and 1+ on the left side. Neg SLR in BLE  Skin: Skin is warm and dry. No rash noted. She is not diaphoretic. No erythema. No pallor.  Psychiatric: Her behavior is normal. Judgment and thought content normal.  Vitals reviewed.   Lab Results  Component Value Date   WBC 3.2* 05/15/2014   HGB 12.0 05/15/2014   HCT 36.5 05/15/2014   PLT 213.0 05/15/2014   GLUCOSE 86 07/29/2014   CHOL 234* 05/15/2014   TRIG 76.0 05/15/2014   HDL 63.90 05/15/2014   LDLDIRECT 157.5 02/25/2013   LDLCALC 155* 05/15/2014   ALT 18 05/15/2014   AST 21 05/15/2014   NA 140 07/29/2014   K 4.6 07/29/2014   CL 104 07/29/2014   CREATININE 0.8 07/29/2014   BUN 16 07/29/2014   CO2 30 07/29/2014   TSH 2.60 05/15/2014   INR 0.95 02/24/2011    Ct Chest W Contrast  05/26/2014   CLINICAL DATA:  HISTORY OF ANAL CANCER, 1 YEAR FOLLOW-UP, EVALUATE FOR METASTATIC DISEASE. HEMORRHOID BANDING. RADIATION AND CHEMOTHERAPY COMPLETED 07/05/2013.  EXAM: CT CHEST AND PELVIS WITH CONTRAST  TECHNIQUE: Multidetector CT imaging of the chest and pelvis was performed using the standard protocol following the bolus administration of intravenous contrast.  CONTRAST:  114mL OMNIPAQUE IOHEXOL 300 MG/ML  SOLN   COMPARISON:  CT ABDOMEN 05/12/2014, PET 05/22/2013 AND CT CHEST ABDOMEN PELVIS 05/06/2013.  CT CHEST FINDINGS: A sub cm partially calcified nodule is again seen in the left lobe of the thyroid. No pathologically enlarged mediastinal, hilar or axillary lymph nodes. Heart size within normal limits. No pericardial effusion.  Lungs are clear.  No pleural fluid.  Airway is unremarkable.  Incidental imaging of the upper abdomen shows the visualized portions of the liver, gallbladder, adrenal glands, kidneys, spleen, pancreas, stomach and bowel to be grossly unremarkable.  CT PELVIS FINDINGS:  Visualized portions of the small bowel, appendix and colon are unremarkable. Uterus and ovaries are visualized. Bladder is unremarkable. Trace atherosclerotic calcification of the arterial vasculature. No pathologically enlarged lymph nodes. No free fluid.  Patchy sclerosis is seen throughout the sacrum and left superior pubic ramus, new. L4-5 posterior lumbar interbody fusion.  IMPRESSION: 1. New patchy sclerosis in the left superior  pubic ramus and bilateral sacrum may be treatment related. Difficult to definitively exclude metastatic disease. Please correlate clinically. 2. Otherwise, no evidence of metastatic disease in the chest or pelvis.   Electronically Signed   By: Lorin Picket M.D.   On: 05/26/2014 15:42   Ct Pelvis W Contrast  05/26/2014   CLINICAL DATA:  HISTORY OF ANAL CANCER, 1 YEAR FOLLOW-UP, EVALUATE FOR METASTATIC DISEASE. HEMORRHOID BANDING. RADIATION AND CHEMOTHERAPY COMPLETED 07/05/2013.  EXAM: CT CHEST AND PELVIS WITH CONTRAST  TECHNIQUE: Multidetector CT imaging of the chest and pelvis was performed using the standard protocol following the bolus administration of intravenous contrast.  CONTRAST:  143mL OMNIPAQUE IOHEXOL 300 MG/ML  SOLN  COMPARISON:  CT ABDOMEN 05/12/2014, PET 05/22/2013 AND CT CHEST ABDOMEN PELVIS 05/06/2013.  CT CHEST FINDINGS: A sub cm partially calcified nodule is again seen in the  left lobe of the thyroid. No pathologically enlarged mediastinal, hilar or axillary lymph nodes. Heart size within normal limits. No pericardial effusion.  Lungs are clear.  No pleural fluid.  Airway is unremarkable.  Incidental imaging of the upper abdomen shows the visualized portions of the liver, gallbladder, adrenal glands, kidneys, spleen, pancreas, stomach and bowel to be grossly unremarkable.  CT PELVIS FINDINGS:  Visualized portions of the small bowel, appendix and colon are unremarkable. Uterus and ovaries are visualized. Bladder is unremarkable. Trace atherosclerotic calcification of the arterial vasculature. No pathologically enlarged lymph nodes. No free fluid.  Patchy sclerosis is seen throughout the sacrum and left superior pubic ramus, new. L4-5 posterior lumbar interbody fusion.  IMPRESSION: 1. New patchy sclerosis in the left superior pubic ramus and bilateral sacrum may be treatment related. Difficult to definitively exclude metastatic disease. Please correlate clinically. 2. Otherwise, no evidence of metastatic disease in the chest or pelvis.   Electronically Signed   By: Lorin Picket M.D.   On: 05/26/2014 15:42    Assessment & Plan:   Doriann was seen today for back pain.  Diagnoses and all orders for this visit:  Bilateral low back pain without sciatica - will cont the current meds for symptom relief, she does not want to have another MRI done or to start PT Orders: -     oxyCODONE-acetaminophen (PERCOCET/ROXICET) 5-325 MG per tablet; Take 1 tablet by mouth every 4 (four) hours as needed for severe pain. -     diazepam (VALIUM) 10 MG tablet; Take 1 tablet (10 mg total) by mouth every 12 (twelve) hours as needed. for anxiety  Midline back pain, unspecified location  Gastroesophageal reflux disease without esophagitis Orders: -     CBC with Differential/Platelet; Future -     Urinalysis, Routine w reflex microscopic (not at [facility]); Future  Osteoporosis of  disuse Orders: -     Comprehensive metabolic panel; Future -     Urinalysis, Routine w reflex microscopic (not at [facility]); Future  I have changed Ms. Hamric's diazepam. I am also having her maintain her omeprazole, lactobacillus acidophilus, FeCbn-FA-B Complex-A-C-D-E-Min (ACTIVE FE PO), cholecalciferol, docusate sodium, MAGNESIUM PO, and oxyCODONE-acetaminophen.  Meds ordered this encounter  Medications  . MAGNESIUM PO    Sig: Take by mouth.  . oxyCODONE-acetaminophen (PERCOCET/ROXICET) 5-325 MG per tablet    Sig: Take 1 tablet by mouth every 4 (four) hours as needed for severe pain.    Dispense:  75 tablet    Refill:  0    Fill on or after 11/18/13  . diazepam (VALIUM) 10 MG tablet    Sig: Take 1 tablet (  10 mg total) by mouth every 12 (twelve) hours as needed. for anxiety    Dispense:  60 tablet    Refill:  2     Follow-up: Return in about 4 months (around 05/23/2015).  Scarlette Calico, MD

## 2015-01-21 NOTE — Progress Notes (Signed)
Pre visit review using our clinic review tool, if applicable. No additional management support is needed unless otherwise documented below in the visit note. 

## 2015-01-21 NOTE — Patient Instructions (Signed)
Back Pain, Adult Low back pain is very common. About 1 in 5 people have back pain.The cause of low back pain is rarely dangerous. The pain often gets better over time.About half of people with a sudden onset of back pain feel better in just 2 weeks. About 8 in 10 people feel better by 6 weeks.  CAUSES Some common causes of back pain include:  Strain of the muscles or ligaments supporting the spine.  Wear and tear (degeneration) of the spinal discs.  Arthritis.  Direct injury to the back. DIAGNOSIS Most of the time, the direct cause of low back pain is not known.However, back pain can be treated effectively even when the exact cause of the pain is unknown.Answering your caregiver's questions about your overall health and symptoms is one of the most accurate ways to make sure the cause of your pain is not dangerous. If your caregiver needs more information, he or she may order lab work or imaging tests (X-rays or MRIs).However, even if imaging tests show changes in your back, this usually does not require surgery. HOME CARE INSTRUCTIONS For many people, back pain returns.Since low back pain is rarely dangerous, it is often a condition that people can learn to manageon their own.   Remain active. It is stressful on the back to sit or stand in one place. Do not sit, drive, or stand in one place for more than 30 minutes at a time. Take short walks on level surfaces as soon as pain allows.Try to increase the length of time you walk each day.  Do not stay in bed.Resting more than 1 or 2 days can delay your recovery.  Do not avoid exercise or work.Your body is made to move.It is not dangerous to be active, even though your back may hurt.Your back will likely heal faster if you return to being active before your pain is gone.  Pay attention to your body when you bend and lift. Many people have less discomfortwhen lifting if they bend their knees, keep the load close to their bodies,and  avoid twisting. Often, the most comfortable positions are those that put less stress on your recovering back.  Find a comfortable position to sleep. Use a firm mattress and lie on your side with your knees slightly bent. If you lie on your back, put a pillow under your knees.  Only take over-the-counter or prescription medicines as directed by your caregiver. Over-the-counter medicines to reduce pain and inflammation are often the most helpful.Your caregiver may prescribe muscle relaxant drugs.These medicines help dull your pain so you can more quickly return to your normal activities and healthy exercise.  Put ice on the injured area.  Put ice in a plastic bag.  Place a towel between your skin and the bag.  Leave the ice on for 15-20 minutes, 03-04 times a day for the first 2 to 3 days. After that, ice and heat may be alternated to reduce pain and spasms.  Ask your caregiver about trying back exercises and gentle massage. This may be of some benefit.  Avoid feeling anxious or stressed.Stress increases muscle tension and can worsen back pain.It is important to recognize when you are anxious or stressed and learn ways to manage it.Exercise is a great option. SEEK MEDICAL CARE IF:  You have pain that is not relieved with rest or medicine.  You have pain that does not improve in 1 week.  You have new symptoms.  You are generally not feeling well. SEEK   IMMEDIATE MEDICAL CARE IF:   You have pain that radiates from your back into your legs.  You develop new bowel or bladder control problems.  You have unusual weakness or numbness in your arms or legs.  You develop nausea or vomiting.  You develop abdominal pain.  You feel faint. Document Released: 07/18/2005 Document Revised: 01/17/2012 Document Reviewed: 11/19/2013 [employer] Patient Information 2015 [employer]. This information is not intended to replace advice given to you by your health care provider. Make sure you  discuss any questions you have with your health care provider.  

## 2015-01-23 ENCOUNTER — Telehealth: Payer: Self-pay | Admitting: Internal Medicine

## 2015-01-23 NOTE — Telephone Encounter (Signed)
Patient states she seen Dr. Ronnald Ramp this week and they decided not to do labs.  Now patient states she would like to have labs done.  She is requesting an order to be placed with the lab.

## 2015-01-24 NOTE — Telephone Encounter (Signed)
Labs ordered.

## 2015-01-26 NOTE — Telephone Encounter (Signed)
Left patient vm °

## 2015-01-29 ENCOUNTER — Encounter: Payer: Self-pay | Admitting: Internal Medicine

## 2015-01-29 ENCOUNTER — Telehealth: Payer: Self-pay

## 2015-01-29 ENCOUNTER — Other Ambulatory Visit (INDEPENDENT_AMBULATORY_CARE_PROVIDER_SITE_OTHER): Payer: BLUE CROSS/BLUE SHIELD

## 2015-01-29 DIAGNOSIS — M818 Other osteoporosis without current pathological fracture: Secondary | ICD-10-CM | POA: Diagnosis not present

## 2015-01-29 DIAGNOSIS — M545 Low back pain, unspecified: Secondary | ICD-10-CM

## 2015-01-29 DIAGNOSIS — K219 Gastro-esophageal reflux disease without esophagitis: Secondary | ICD-10-CM | POA: Diagnosis not present

## 2015-01-29 LAB — CBC WITH DIFFERENTIAL/PLATELET
BASOS ABS: 0 10*3/uL (ref 0.0–0.1)
BASOS PCT: 0.4 % (ref 0.0–3.0)
Eosinophils Absolute: 0.1 10*3/uL (ref 0.0–0.7)
Eosinophils Relative: 2.5 % (ref 0.0–5.0)
HCT: 37.5 % (ref 36.0–46.0)
HEMOGLOBIN: 12.6 g/dL (ref 12.0–15.0)
LYMPHS ABS: 1.6 10*3/uL (ref 0.7–4.0)
Lymphocytes Relative: 38.8 % (ref 12.0–46.0)
MCHC: 33.6 g/dL (ref 30.0–36.0)
MCV: 88.6 fl (ref 78.0–100.0)
MONOS PCT: 13.6 % — AB (ref 3.0–12.0)
Monocytes Absolute: 0.6 10*3/uL (ref 0.1–1.0)
Neutro Abs: 1.8 10*3/uL (ref 1.4–7.7)
Neutrophils Relative %: 44.7 % (ref 43.0–77.0)
Platelets: 238 10*3/uL (ref 150.0–400.0)
RBC: 4.23 Mil/uL (ref 3.87–5.11)
RDW: 14 % (ref 11.5–15.5)
WBC: 4.1 10*3/uL (ref 4.0–10.5)

## 2015-01-29 LAB — COMPREHENSIVE METABOLIC PANEL
ALBUMIN: 4.1 g/dL (ref 3.5–5.2)
ALT: 17 U/L (ref 0–35)
AST: 21 U/L (ref 0–37)
Alkaline Phosphatase: 45 U/L (ref 39–117)
BUN: 18 mg/dL (ref 6–23)
CO2: 31 meq/L (ref 19–32)
Calcium: 9.4 mg/dL (ref 8.4–10.5)
Chloride: 104 mEq/L (ref 96–112)
Creatinine, Ser: 0.79 mg/dL (ref 0.40–1.20)
GFR: 78.63 mL/min (ref >60.00)
GLUCOSE: 86 mg/dL (ref 70–99)
Potassium: 4.2 mEq/L (ref 3.5–5.1)
Sodium: 140 mEq/L (ref 135–145)
Total Bilirubin: 0.4 mg/dL (ref 0.2–1.2)
Total Protein: 7.1 g/dL (ref 6.0–8.3)

## 2015-01-29 LAB — URINALYSIS, ROUTINE W REFLEX MICROSCOPIC
Bilirubin Urine: NEGATIVE
Hgb urine dipstick: NEGATIVE
KETONES UR: NEGATIVE
Leukocytes, UA: NEGATIVE
Nitrite: NEGATIVE
RBC / HPF: NONE SEEN (ref 0–?)
Specific Gravity, Urine: <=1.005 — AB (ref 1.000–1.030)
TOTAL PROTEIN, URINE-UPE24: NEGATIVE
URINE GLUCOSE: NEGATIVE
UROBILINOGEN UA: 0.2 (ref 0.0–1.0)
WBC, UA: NONE SEEN (ref 0–?)
pH: 5.5 (ref 5.0–8.0)

## 2015-01-29 MED ORDER — DIAZEPAM 10 MG PO TABS: 10.0000 mg | ORAL_TABLET | Freq: Two times a day (BID) | ORAL | Status: DC | PRN

## 2015-01-29 MED ORDER — OXYCODONE-ACETAMINOPHEN 5-325 MG PO TABS: 1.0000 | ORAL_TABLET | ORAL | Status: DC | PRN

## 2015-01-29 NOTE — Telephone Encounter (Signed)
Patient stopped by the office stating that she was seen last week, given a Rx for Oxycodone 5-325 but can not fill due to date of 11/18/13. Old script returned for an updated Rx. New Rx printed and placed upfront for pick up.

## 2015-03-16 ENCOUNTER — Telehealth: Payer: Self-pay | Admitting: Nurse Practitioner

## 2015-03-16 NOTE — Telephone Encounter (Signed)
Pt called to cancel apt states she doesn't feel that she needs these apts and will c/b if needed... KJ

## 2015-03-18 ENCOUNTER — Ambulatory Visit: Payer: BC Managed Care – PPO | Admitting: Nurse Practitioner

## 2015-07-01 ENCOUNTER — Other Ambulatory Visit: Payer: Self-pay | Admitting: Internal Medicine

## 2015-07-01 NOTE — Telephone Encounter (Signed)
fxd

## 2015-07-30 ENCOUNTER — Ambulatory Visit: Payer: BC Managed Care – PPO | Admitting: Internal Medicine

## 2015-09-01 ENCOUNTER — Encounter: Payer: Self-pay | Admitting: Internal Medicine

## 2015-09-01 ENCOUNTER — Other Ambulatory Visit (INDEPENDENT_AMBULATORY_CARE_PROVIDER_SITE_OTHER): Payer: BLUE CROSS/BLUE SHIELD

## 2015-09-01 ENCOUNTER — Ambulatory Visit (INDEPENDENT_AMBULATORY_CARE_PROVIDER_SITE_OTHER): Payer: BLUE CROSS/BLUE SHIELD | Admitting: Internal Medicine

## 2015-09-01 VITALS — BP 100/60 | HR 62 | Temp 97.9°F | Resp 16 | Ht 60.0 in | Wt 127.0 lb

## 2015-09-01 DIAGNOSIS — K2100 Gastro-esophageal reflux disease with esophagitis, without bleeding: Secondary | ICD-10-CM

## 2015-09-01 DIAGNOSIS — M545 Low back pain, unspecified: Secondary | ICD-10-CM

## 2015-09-01 DIAGNOSIS — Z23 Encounter for immunization: Secondary | ICD-10-CM | POA: Diagnosis not present

## 2015-09-01 DIAGNOSIS — K921 Melena: Secondary | ICD-10-CM

## 2015-09-01 DIAGNOSIS — C210 Malignant neoplasm of anus, unspecified: Secondary | ICD-10-CM | POA: Diagnosis not present

## 2015-09-01 DIAGNOSIS — R1310 Dysphagia, unspecified: Secondary | ICD-10-CM | POA: Diagnosis not present

## 2015-09-01 LAB — COMPREHENSIVE METABOLIC PANEL
ALT: 15 U/L (ref 0–35)
AST: 20 U/L (ref 0–37)
Albumin: 4.4 g/dL (ref 3.5–5.2)
Alkaline Phosphatase: 45 U/L (ref 39–117)
BILIRUBIN TOTAL: 0.3 mg/dL (ref 0.2–1.2)
BUN: 17 mg/dL (ref 6–23)
CHLORIDE: 102 meq/L (ref 96–112)
CO2: 30 meq/L (ref 19–32)
CREATININE: 1.03 mg/dL (ref 0.40–1.20)
Calcium: 9.8 mg/dL (ref 8.4–10.5)
GFR: 57.78 mL/min — ABNORMAL LOW (ref >60.00)
GLUCOSE: 106 mg/dL — AB (ref 70–99)
Potassium: 3.8 mEq/L (ref 3.5–5.1)
SODIUM: 139 meq/L (ref 135–145)
Total Protein: 7.3 g/dL (ref 6.0–8.3)

## 2015-09-01 LAB — CBC WITH DIFFERENTIAL/PLATELET
BASOS ABS: 0 10*3/uL (ref 0.0–0.1)
BASOS PCT: 0.3 % (ref 0.0–3.0)
EOS ABS: 0.1 10*3/uL (ref 0.0–0.7)
Eosinophils Relative: 0.9 % (ref 0.0–5.0)
HCT: 37.2 % (ref 36.0–46.0)
Hemoglobin: 12.1 g/dL (ref 12.0–15.0)
LYMPHS ABS: 1.4 10*3/uL (ref 0.7–4.0)
Lymphocytes Relative: 24.8 % (ref 12.0–46.0)
MCHC: 32.7 g/dL (ref 30.0–36.0)
MCV: 88.4 fl (ref 78.0–100.0)
Monocytes Absolute: 0.5 10*3/uL (ref 0.1–1.0)
Monocytes Relative: 8.7 % (ref 3.0–12.0)
NEUTROS ABS: 3.7 10*3/uL (ref 1.4–7.7)
NEUTROS PCT: 65.3 % (ref 43.0–77.0)
PLATELETS: 250 10*3/uL (ref 150.0–400.0)
RBC: 4.2 Mil/uL (ref 3.87–5.11)
RDW: 13.8 % (ref 11.5–15.5)
WBC: 5.7 10*3/uL (ref 4.0–10.5)

## 2015-09-01 LAB — FERRITIN: Ferritin: 33.6 ng/mL (ref 10.0–291.0)

## 2015-09-01 LAB — TSH: TSH: 1.84 u[IU]/mL (ref 0.35–4.50)

## 2015-09-01 LAB — IBC PANEL
Iron: 46 ug/dL (ref 42–145)
SATURATION RATIOS: 12.7 % — AB (ref 20.0–50.0)
TRANSFERRIN: 258 mg/dL (ref 212.0–360.0)

## 2015-09-01 MED ORDER — DIAZEPAM 10 MG PO TABS: 10.0000 mg | ORAL_TABLET | Freq: Two times a day (BID) | ORAL | Status: DC | PRN

## 2015-09-01 MED ORDER — OXYCODONE-ACETAMINOPHEN 5-325 MG PO TABS: 1.0000 | ORAL_TABLET | ORAL | Status: DC | PRN

## 2015-09-01 NOTE — Patient Instructions (Signed)

## 2015-09-01 NOTE — Progress Notes (Signed)
Subjective:  Patient ID: Emily Compton, female    DOB: 1955/10/12  Age: 60 y.o. MRN: [id number]  CC: Gastroesophageal Reflux   HPI [name] presents for follow-up, she complains of a vague sensation of dysphagia. She has taken Prilosec for many years for gastroesophageal reflux disease and otherwise feels like it's well-controlled. She also has intermittent blood in her stool and needs to follow with gastroenterology regarding a prior history of anal cancer.  Outpatient Prescriptions Prior to Visit  Medication Sig Dispense Refill  . cholecalciferol (VITAMIN D) 1000 UNITS tablet Take 1,000 Units by mouth daily.    Marland Kitchen docusate sodium (COLACE) 100 MG capsule Take 300 mg by mouth daily. Uses Dulcolax brand softener    . FeCbn-FA-B Complex-A-C-D-E-Min (ACTIVE FE PO) Take 1 tablet by mouth daily.    Marland Kitchen lactobacillus acidophilus (BACID) TABS tablet Take 1 tablet by mouth daily.     Marland Kitchen MAGNESIUM PO Take by mouth.    Marland Kitchen omeprazole (PRILOSEC OTC) 20 MG tablet Take 20 mg by mouth daily. Take one tablet as needed for acid reflux     . diazepam (VALIUM) 10 MG tablet TAKE 1 TABLET BY MOUTH EVERY 12 HOURS AS NEEDED FOR ANXIETY 60 tablet 1  . oxyCODONE-acetaminophen (PERCOCET/ROXICET) 5-325 MG per tablet Take 1 tablet by mouth every 4 (four) hours as needed for severe pain. 75 tablet 0   No facility-administered medications prior to visit.    ROS Review of Systems  Constitutional: Negative.  Negative for fever, chills, diaphoresis, appetite change and fatigue.  HENT: Positive for trouble swallowing. Negative for congestion and voice change.   Eyes: Negative.   Respiratory: Negative.  Negative for cough, choking, chest tightness, shortness of breath and stridor.   Cardiovascular: Negative.  Negative for chest pain, palpitations and leg swelling.  Gastrointestinal: Positive for blood in stool. Negative for nausea, vomiting, abdominal pain, diarrhea and constipation.  Endocrine: Negative.     Genitourinary: Negative.   Musculoskeletal: Positive for back pain. Negative for myalgias, joint swelling and arthralgias.       She has chronic, intermittent, unchanged low back pain.  Skin: Negative.  Negative for color change and rash.  Allergic/Immunologic: Negative.   Neurological: Negative.   Hematological: Negative.  Negative for adenopathy. Does not bruise/bleed easily.  Psychiatric/Behavioral: Negative for suicidal ideas, sleep disturbance, dysphoric mood, decreased concentration and agitation. The patient is nervous/anxious.     Objective:  BP 100/60 mmHg  Pulse 62  Temp(Src) 97.9 F (36.6 C) (Oral)  Resp 16  Ht 5' (1.524 m)  Wt 127 lb (57.607 kg)  BMI 24.80 kg/m2  SpO2 97%  BP Readings from Last 3 Encounters:  09/01/15 100/60  01/21/15 100/80  07/29/14 102/60    Wt Readings from Last 3 Encounters:  09/01/15 127 lb (57.607 kg)  01/21/15 124 lb 8 oz (56.473 kg)  07/29/14 120 lb 6.4 oz (54.613 kg)    Physical Exam  Constitutional: She is oriented to person, place, and time. No distress.  HENT:  Head: Normocephalic and atraumatic.  Mouth/Throat: Oropharynx is clear and moist. No oropharyngeal exudate.  Eyes: Conjunctivae are normal. Right eye exhibits no discharge. Left eye exhibits no discharge. No scleral icterus.  Neck: Normal range of motion. Neck supple. No JVD present. No tracheal deviation present. No thyromegaly present.  Cardiovascular: Normal rate, regular rhythm, normal heart sounds and intact distal pulses.  Exam reveals no gallop and no friction rub.   No murmur heard. Pulmonary/Chest: Effort normal and breath sounds normal.  No stridor. No respiratory distress. She has no wheezes. She has no rales. She exhibits no tenderness.  Abdominal: Soft. Bowel sounds are normal. She exhibits no distension and no mass. There is no tenderness. There is no rebound and no guarding.  Musculoskeletal: Normal range of motion. She exhibits no edema or tenderness.   Lymphadenopathy:    She has no cervical adenopathy.  Neurological: She is oriented to person, place, and time.  Skin: Skin is warm and dry. No rash noted. She is not diaphoretic. No erythema. No pallor.  Vitals reviewed.   Lab Results  Component Value Date   WBC 5.7 09/01/2015   HGB 12.1 09/01/2015   HCT 37.2 09/01/2015   PLT 250.0 09/01/2015   GLUCOSE 106* 09/01/2015   CHOL 234* 05/15/2014   TRIG 76.0 05/15/2014   HDL 63.90 05/15/2014   LDLDIRECT 157.5 02/25/2013   LDLCALC 155* 05/15/2014   ALT 15 09/01/2015   AST 20 09/01/2015   NA 139 09/01/2015   K 3.8 09/01/2015   CL 102 09/01/2015   CREATININE 1.03 09/01/2015   BUN 17 09/01/2015   CO2 30 09/01/2015   TSH 1.84 09/01/2015   INR 0.95 02/24/2011    Ct Chest W Contrast  05/26/2014  CLINICAL DATA:  HISTORY OF ANAL CANCER, 1 YEAR FOLLOW-UP, EVALUATE FOR METASTATIC DISEASE. HEMORRHOID BANDING. RADIATION AND CHEMOTHERAPY COMPLETED 07/05/2013. EXAM: CT CHEST AND PELVIS WITH CONTRAST TECHNIQUE: Multidetector CT imaging of the chest and pelvis was performed using the standard protocol following the bolus administration of intravenous contrast. CONTRAST:  146mL OMNIPAQUE IOHEXOL 300 MG/ML  SOLN COMPARISON:  CT ABDOMEN 05/12/2014, PET 05/22/2013 AND CT CHEST ABDOMEN PELVIS 05/06/2013. CT CHEST FINDINGS: A sub cm partially calcified nodule is again seen in the left lobe of the thyroid. No pathologically enlarged mediastinal, hilar or axillary lymph nodes. Heart size within normal limits. No pericardial effusion. Lungs are clear.  No pleural fluid.  Airway is unremarkable. Incidental imaging of the upper abdomen shows the visualized portions of the liver, gallbladder, adrenal glands, kidneys, spleen, pancreas, stomach and bowel to be grossly unremarkable. CT PELVIS FINDINGS: Visualized portions of the small bowel, appendix and colon are unremarkable. Uterus and ovaries are visualized. Bladder is unremarkable. Trace atherosclerotic  calcification of the arterial vasculature. No pathologically enlarged lymph nodes. No free fluid. Patchy sclerosis is seen throughout the sacrum and left superior pubic ramus, new. L4-5 posterior lumbar interbody fusion. IMPRESSION: 1. New patchy sclerosis in the left superior pubic ramus and bilateral sacrum may be treatment related. Difficult to definitively exclude metastatic disease. Please correlate clinically. 2. Otherwise, no evidence of metastatic disease in the chest or pelvis. Electronically Signed   By: Lorin Picket M.D.   On: 05/26/2014 15:42   Ct Pelvis W Contrast  05/26/2014  CLINICAL DATA:  HISTORY OF ANAL CANCER, 1 YEAR FOLLOW-UP, EVALUATE FOR METASTATIC DISEASE. HEMORRHOID BANDING. RADIATION AND CHEMOTHERAPY COMPLETED 07/05/2013. EXAM: CT CHEST AND PELVIS WITH CONTRAST TECHNIQUE: Multidetector CT imaging of the chest and pelvis was performed using the standard protocol following the bolus administration of intravenous contrast. CONTRAST:  159mL OMNIPAQUE IOHEXOL 300 MG/ML  SOLN COMPARISON:  CT ABDOMEN 05/12/2014, PET 05/22/2013 AND CT CHEST ABDOMEN PELVIS 05/06/2013. CT CHEST FINDINGS: A sub cm partially calcified nodule is again seen in the left lobe of the thyroid. No pathologically enlarged mediastinal, hilar or axillary lymph nodes. Heart size within normal limits. No pericardial effusion. Lungs are clear.  No pleural fluid.  Airway is unremarkable. Incidental imaging of the  upper abdomen shows the visualized portions of the liver, gallbladder, adrenal glands, kidneys, spleen, pancreas, stomach and bowel to be grossly unremarkable. CT PELVIS FINDINGS: Visualized portions of the small bowel, appendix and colon are unremarkable. Uterus and ovaries are visualized. Bladder is unremarkable. Trace atherosclerotic calcification of the arterial vasculature. No pathologically enlarged lymph nodes. No free fluid. Patchy sclerosis is seen throughout the sacrum and left superior pubic ramus, new.  L4-5 posterior lumbar interbody fusion. IMPRESSION: 1. New patchy sclerosis in the left superior pubic ramus and bilateral sacrum may be treatment related. Difficult to definitively exclude metastatic disease. Please correlate clinically. 2. Otherwise, no evidence of metastatic disease in the chest or pelvis. Electronically Signed   By: Lorin Picket M.D.   On: 05/26/2014 15:42    Assessment & Plan:   Chun was seen today for gastroesophageal reflux.  Diagnoses and all orders for this visit:  Gastroesophageal reflux disease with esophagitis- she complains of dysphasia side of asked her to see GI. -     Comprehensive metabolic panel; Future  Anal cancer ([place]) -     CBC with Differential/Platelet; Future -     IBC panel; Future -     Ferritin; Future -     Ambulatory referral to Gastroenterology  Dysphagia- I have asked her to see GI to consider having an upper endoscopy performed. -     TSH; Future -     Ambulatory referral to Gastroenterology  Blood in stool- this is a chronic recurrent problem for her, she is not anemic and her iron level is normal, she may be due for follow-up colonoscopy so I referred her to gastroenterology. -     CBC with Differential/Platelet; Future -     IBC panel; Future -     Ferritin; Future -     Ambulatory referral to Gastroenterology  Bilateral low back pain without sciatica -     oxyCODONE-acetaminophen (PERCOCET/ROXICET) 5-325 MG tablet; Take 1 tablet by mouth every 4 (four) hours as needed for severe pain.  Need for vaccination with 13-polyvalent pneumococcal conjugate vaccine -     Pneumococcal conjugate vaccine 13-valent  Other orders -     diazepam (VALIUM) 10 MG tablet; Take 1 tablet (10 mg total) by mouth every 12 (twelve) hours as needed. for anxiety  I have changed Ms. Kitko's oxyCODONE-acetaminophen and diazepam. I am also having her maintain her omeprazole, lactobacillus acidophilus, FeCbn-FA-B Complex-A-C-D-E-Min (ACTIVE FE  PO), cholecalciferol, docusate sodium, and MAGNESIUM PO.  Meds ordered this encounter  Medications  . oxyCODONE-acetaminophen (PERCOCET/ROXICET) 5-325 MG tablet    Sig: Take 1 tablet by mouth every 4 (four) hours as needed for severe pain.    Dispense:  75 tablet    Refill:  0    Fill on or after 08/31/15  . diazepam (VALIUM) 10 MG tablet    Sig: Take 1 tablet (10 mg total) by mouth every 12 (twelve) hours as needed. for anxiety    Dispense:  60 tablet    Refill:  1     Follow-up: Return in about 3 months (around 11/29/2015).  Scarlette Calico, MD

## 2015-11-12 ENCOUNTER — Encounter: Payer: Self-pay | Admitting: Internal Medicine

## 2015-12-15 ENCOUNTER — Other Ambulatory Visit: Payer: Self-pay | Admitting: Internal Medicine

## 2015-12-16 NOTE — Telephone Encounter (Signed)
Faxed script back to [facility].../lmb 

## 2016-05-03 ENCOUNTER — Other Ambulatory Visit: Payer: Self-pay | Admitting: Internal Medicine

## 2016-05-03 ENCOUNTER — Telehealth: Payer: Self-pay | Admitting: *Deleted

## 2016-05-03 DIAGNOSIS — M545 Low back pain, unspecified: Secondary | ICD-10-CM

## 2016-05-03 DIAGNOSIS — G8929 Other chronic pain: Secondary | ICD-10-CM

## 2016-05-03 MED ORDER — OXYCODONE-ACETAMINOPHEN 5-325 MG PO TABS: 1.0000 | ORAL_TABLET | ORAL | 0 refills | Status: DC | PRN

## 2016-05-03 NOTE — Telephone Encounter (Signed)
RX written 

## 2016-05-03 NOTE — Telephone Encounter (Signed)
Pt left msg on triage yesterday afternoon requesting refill on Oxycodone...Emily Compton

## 2016-05-03 NOTE — Telephone Encounter (Signed)
Notified pt rx ready for pick-up.../lmb 

## 2016-05-04 ENCOUNTER — Ambulatory Visit: Payer: BLUE CROSS/BLUE SHIELD | Admitting: Internal Medicine

## 2016-06-21 ENCOUNTER — Other Ambulatory Visit: Payer: Self-pay | Admitting: Internal Medicine

## 2016-06-21 NOTE — Telephone Encounter (Signed)
rx faxed to [facility].  

## 2016-08-19 ENCOUNTER — Other Ambulatory Visit: Payer: Self-pay | Admitting: Nurse Practitioner

## 2016-09-21 ENCOUNTER — Other Ambulatory Visit: Payer: Self-pay | Admitting: Internal Medicine

## 2016-09-21 NOTE — Telephone Encounter (Signed)
Rx faxed to pharmacy  

## 2016-12-07 ENCOUNTER — Other Ambulatory Visit: Payer: Self-pay | Admitting: Internal Medicine

## 2017-01-23 ENCOUNTER — Telehealth: Payer: Self-pay | Admitting: *Deleted

## 2017-01-23 NOTE — Telephone Encounter (Signed)
Pt left msg on triage requesting refills on Oxycodone amd Diazepam. Pt has not seen MD since 08/2015. Pt must make appt for refills on controls...Emily Compton

## 2017-01-24 NOTE — Telephone Encounter (Signed)
LM with pt to make appointment

## 2017-01-25 NOTE — Telephone Encounter (Signed)
Got patient scheduled for 7/5

## 2017-02-02 ENCOUNTER — Ambulatory Visit (INDEPENDENT_AMBULATORY_CARE_PROVIDER_SITE_OTHER): Payer: BLUE CROSS/BLUE SHIELD | Admitting: Internal Medicine

## 2017-02-02 ENCOUNTER — Other Ambulatory Visit (INDEPENDENT_AMBULATORY_CARE_PROVIDER_SITE_OTHER): Payer: BLUE CROSS/BLUE SHIELD

## 2017-02-02 ENCOUNTER — Encounter: Payer: Self-pay | Admitting: Internal Medicine

## 2017-02-02 VITALS — BP 120/70 | HR 69 | Temp 97.9°F | Resp 16 | Ht 60.0 in | Wt 131.2 lb

## 2017-02-02 DIAGNOSIS — M545 Low back pain: Secondary | ICD-10-CM

## 2017-02-02 DIAGNOSIS — M549 Dorsalgia, unspecified: Secondary | ICD-10-CM | POA: Diagnosis not present

## 2017-02-02 DIAGNOSIS — F411 Generalized anxiety disorder: Secondary | ICD-10-CM

## 2017-02-02 DIAGNOSIS — Z0000 Encounter for general adult medical examination without abnormal findings: Secondary | ICD-10-CM | POA: Diagnosis not present

## 2017-02-02 DIAGNOSIS — Z23 Encounter for immunization: Secondary | ICD-10-CM

## 2017-02-02 DIAGNOSIS — K219 Gastro-esophageal reflux disease without esophagitis: Secondary | ICD-10-CM | POA: Diagnosis not present

## 2017-02-02 DIAGNOSIS — E785 Hyperlipidemia, unspecified: Secondary | ICD-10-CM

## 2017-02-02 DIAGNOSIS — G8929 Other chronic pain: Secondary | ICD-10-CM | POA: Diagnosis not present

## 2017-02-02 DIAGNOSIS — Z1231 Encounter for screening mammogram for malignant neoplasm of breast: Secondary | ICD-10-CM | POA: Insufficient documentation

## 2017-02-02 LAB — COMPREHENSIVE METABOLIC PANEL
ALBUMIN: 4.3 g/dL (ref 3.5–5.2)
ALK PHOS: 57 U/L (ref 39–117)
ALT: 14 U/L (ref 0–35)
AST: 18 U/L (ref 0–37)
BILIRUBIN TOTAL: 0.3 mg/dL (ref 0.2–1.2)
BUN: 18 mg/dL (ref 6–23)
CALCIUM: 9.6 mg/dL (ref 8.4–10.5)
CHLORIDE: 103 meq/L (ref 96–112)
CO2: 29 mEq/L (ref 19–32)
CREATININE: 0.83 mg/dL (ref 0.40–1.20)
GFR: 73.79 mL/min (ref >60.00)
Glucose, Bld: 99 mg/dL (ref 70–99)
Potassium: 4.3 mEq/L (ref 3.5–5.1)
Sodium: 138 mEq/L (ref 135–145)
TOTAL PROTEIN: 7.2 g/dL (ref 6.0–8.3)

## 2017-02-02 LAB — LIPID PANEL
CHOLESTEROL: 269 mg/dL — AB (ref 0–200)
HDL: 92.1 mg/dL (ref >39.00)
LDL CALC: 160 mg/dL — AB (ref 0–99)
NonHDL: 176.44
TRIGLYCERIDES: 81 mg/dL (ref 0.0–149.0)
Total CHOL/HDL Ratio: 3
VLDL: 16.2 mg/dL (ref 0.0–40.0)

## 2017-02-02 LAB — CBC WITH DIFFERENTIAL/PLATELET
BASOS ABS: 0 10*3/uL (ref 0.0–0.1)
Basophils Relative: 0.5 % (ref 0.0–3.0)
EOS ABS: 0.1 10*3/uL (ref 0.0–0.7)
Eosinophils Relative: 1.7 % (ref 0.0–5.0)
HEMATOCRIT: 38.8 % (ref 36.0–46.0)
HEMOGLOBIN: 13 g/dL (ref 12.0–15.0)
LYMPHS PCT: 24.5 % (ref 12.0–46.0)
Lymphs Abs: 1.3 10*3/uL (ref 0.7–4.0)
MCHC: 33.4 g/dL (ref 30.0–36.0)
MCV: 87.5 fl (ref 78.0–100.0)
Monocytes Absolute: 0.5 10*3/uL (ref 0.1–1.0)
Monocytes Relative: 10.2 % (ref 3.0–12.0)
Neutro Abs: 3.3 10*3/uL (ref 1.4–7.7)
Neutrophils Relative %: 63.1 % (ref 43.0–77.0)
Platelets: 254 10*3/uL (ref 150.0–400.0)
RBC: 4.44 Mil/uL (ref 3.87–5.11)
RDW: 14.5 % (ref 11.5–15.5)
WBC: 5.2 10*3/uL (ref 4.0–10.5)

## 2017-02-02 LAB — TSH: TSH: 2.15 u[IU]/mL (ref 0.35–4.50)

## 2017-02-02 MED ORDER — OXYCODONE-ACETAMINOPHEN 5-325 MG PO TABS: 1.0000 | ORAL_TABLET | ORAL | 0 refills | Status: DC | PRN

## 2017-02-02 MED ORDER — DIAZEPAM 10 MG PO TABS: 10.0000 mg | ORAL_TABLET | Freq: Two times a day (BID) | ORAL | 1 refills | Status: DC | PRN

## 2017-02-02 NOTE — Progress Notes (Signed)
Subjective:  Patient ID: Emily Compton, female    DOB: 03-18-1956  Age: 61 y.o. MRN: [id number]  CC: Hyperlipidemia; Gastroesophageal Reflux; and Annual Exam   HPI Emily Compton presents for a CPX - She offers no new complaints today. She is concerned about her recent weight gain. She does occasionally drink beer. She tells me her heartburn is well-controlled with the PPI. She has chronic, unchanged low back pain that is well-controlled with the occasional dose of Percocet. She also has occasional episode of anxiety and panic which is well-controlled with the occasional dose of Valium.  Outpatient Medications Prior to Visit  Medication Sig Dispense Refill  . diazepam (VALIUM) 10 MG tablet TAKE 1 TABLET BY MOUTH EVERY 12 HOURS AS NEEDED 60 tablet 1  . oxyCODONE-acetaminophen (PERCOCET/ROXICET) 5-325 MG tablet Take 1 tablet by mouth every 4 (four) hours as needed for severe pain. 75 tablet 0  . cholecalciferol (VITAMIN D) 1000 UNITS tablet Take 1,000 Units by mouth daily.    Marland Kitchen docusate sodium (COLACE) 100 MG capsule Take 300 mg by mouth daily. Uses Dulcolax brand softener    . FeCbn-FA-B Complex-A-C-D-E-Min (ACTIVE FE PO) Take 1 tablet by mouth daily.    Marland Kitchen lactobacillus acidophilus (BACID) TABS tablet Take 1 tablet by mouth daily.     Marland Kitchen MAGNESIUM PO Take by mouth.    Marland Kitchen omeprazole (PRILOSEC OTC) 20 MG tablet Take 20 mg by mouth daily. Take one tablet as needed for acid reflux      No facility-administered medications prior to visit.     ROS Review of Systems  Constitutional: Positive for unexpected weight change. Negative for appetite change, chills, diaphoresis and fatigue.  HENT: Negative.  Negative for trouble swallowing and voice change.   Eyes: Negative for visual disturbance.  Respiratory: Negative.  Negative for cough, chest tightness, shortness of breath, wheezing and stridor.   Cardiovascular: Negative for chest pain, palpitations and leg swelling.  Gastrointestinal:  Negative.  Negative for abdominal pain, constipation, diarrhea, nausea and vomiting.  Endocrine: Negative.   Genitourinary: Negative.  Negative for decreased urine volume, difficulty urinating and dysuria.  Musculoskeletal: Positive for back pain. Negative for arthralgias, myalgias and neck pain.  Skin: Negative.  Negative for color change and rash.  Allergic/Immunologic: Negative.   Neurological: Negative.  Negative for dizziness, weakness and numbness.  Hematological: Negative for adenopathy. Does not bruise/bleed easily.  Psychiatric/Behavioral: Negative for agitation, behavioral problems, confusion, decreased concentration, dysphoric mood, hallucinations, self-injury, sleep disturbance and suicidal ideas. The patient is nervous/anxious. The patient is not hyperactive.     Objective:  BP 120/70 (BP Location: Left Arm, Patient Position: Sitting, Cuff Size: Normal)   Pulse 69   Temp 97.9 F (36.6 C) (Oral)   Resp 16   Ht 5' (1.524 m)   Wt 131 lb 4 oz (59.5 kg)   SpO2 99%   BMI 25.63 kg/m   BP Readings from Last 3 Encounters:  02/02/17 120/70  09/01/15 100/60  01/21/15 100/80    Wt Readings from Last 3 Encounters:  02/02/17 131 lb 4 oz (59.5 kg)  09/01/15 127 lb (57.6 kg)  01/21/15 124 lb 8 oz (56.5 kg)    Physical Exam  Constitutional: She is oriented to person, place, and time. No distress.  HENT:  Mouth/Throat: Oropharynx is clear and moist. No oropharyngeal exudate.  Eyes: Conjunctivae are normal. Right eye exhibits no discharge. Left eye exhibits no discharge. No scleral icterus.  Neck: Normal range of motion. Neck supple. No JVD present.  No thyromegaly present.  Cardiovascular: Normal rate, regular rhythm and intact distal pulses.  Exam reveals no gallop and no friction rub.   No murmur heard. Pulmonary/Chest: Effort normal and breath sounds normal. No respiratory distress. She has no wheezes. She has no rales. She exhibits no tenderness.  Abdominal: Soft. Bowel  sounds are normal. She exhibits no distension and no mass. There is no tenderness. There is no rebound and no guarding.  Musculoskeletal: Normal range of motion. She exhibits no edema, tenderness or deformity.  Lymphadenopathy:    She has no cervical adenopathy.  Neurological: She is oriented to person, place, and time. She has normal reflexes. She displays normal reflexes. No cranial nerve deficit. She exhibits normal muscle tone. Coordination normal.  Neg SLR in BLE  Skin: Skin is warm and dry. No rash noted. She is not diaphoretic. No erythema. No pallor.  Psychiatric: She has a normal mood and affect. Her behavior is normal. Judgment and thought content normal.  Vitals reviewed.   Lab Results  Component Value Date   WBC 5.2 02/02/2017   HGB 13.0 02/02/2017   HCT 38.8 02/02/2017   PLT 254.0 02/02/2017   GLUCOSE 99 02/02/2017   CHOL 269 (H) 02/02/2017   TRIG 81.0 02/02/2017   HDL 92.10 02/02/2017   LDLDIRECT 157.5 02/25/2013   LDLCALC 160 (H) 02/02/2017   ALT 14 02/02/2017   AST 18 02/02/2017   NA 138 02/02/2017   K 4.3 02/02/2017   CL 103 02/02/2017   CREATININE 0.83 02/02/2017   BUN 18 02/02/2017   CO2 29 02/02/2017   TSH 2.15 02/02/2017   INR 0.95 02/24/2011    Ct Chest W Contrast  Result Date: 05/26/2014 CLINICAL DATA:  HISTORY OF ANAL CANCER, 1 YEAR FOLLOW-UP, EVALUATE FOR METASTATIC DISEASE. HEMORRHOID BANDING. RADIATION AND CHEMOTHERAPY COMPLETED 07/05/2013. EXAM: CT CHEST AND PELVIS WITH CONTRAST TECHNIQUE: Multidetector CT imaging of the chest and pelvis was performed using the standard protocol following the bolus administration of intravenous contrast. CONTRAST:  157mL OMNIPAQUE IOHEXOL 300 MG/ML  SOLN COMPARISON:  CT ABDOMEN 05/12/2014, PET 05/22/2013 AND CT CHEST ABDOMEN PELVIS 05/06/2013. CT CHEST FINDINGS: A sub cm partially calcified nodule is again seen in the left lobe of the thyroid. No pathologically enlarged mediastinal, hilar or axillary lymph nodes.  Heart size within normal limits. No pericardial effusion. Lungs are clear.  No pleural fluid.  Airway is unremarkable. Incidental imaging of the upper abdomen shows the visualized portions of the liver, gallbladder, adrenal glands, kidneys, spleen, pancreas, stomach and bowel to be grossly unremarkable. CT PELVIS FINDINGS: Visualized portions of the small bowel, appendix and colon are unremarkable. Uterus and ovaries are visualized. Bladder is unremarkable. Trace atherosclerotic calcification of the arterial vasculature. No pathologically enlarged lymph nodes. No free fluid. Patchy sclerosis is seen throughout the sacrum and left superior pubic ramus, new. L4-5 posterior lumbar interbody fusion. IMPRESSION: 1. New patchy sclerosis in the left superior pubic ramus and bilateral sacrum may be treatment related. Difficult to definitively exclude metastatic disease. Please correlate clinically. 2. Otherwise, no evidence of metastatic disease in the chest or pelvis. Electronically Signed   By: Lorin Picket M.D.   On: 05/26/2014 15:42   Ct Pelvis W Contrast  Result Date: 05/26/2014 CLINICAL DATA:  HISTORY OF ANAL CANCER, 1 YEAR FOLLOW-UP, EVALUATE FOR METASTATIC DISEASE. HEMORRHOID BANDING. RADIATION AND CHEMOTHERAPY COMPLETED 07/05/2013. EXAM: CT CHEST AND PELVIS WITH CONTRAST TECHNIQUE: Multidetector CT imaging of the chest and pelvis was performed using the standard protocol following  the bolus administration of intravenous contrast. CONTRAST:  175mL OMNIPAQUE IOHEXOL 300 MG/ML  SOLN COMPARISON:  CT ABDOMEN 05/12/2014, PET 05/22/2013 AND CT CHEST ABDOMEN PELVIS 05/06/2013. CT CHEST FINDINGS: A sub cm partially calcified nodule is again seen in the left lobe of the thyroid. No pathologically enlarged mediastinal, hilar or axillary lymph nodes. Heart size within normal limits. No pericardial effusion. Lungs are clear.  No pleural fluid.  Airway is unremarkable. Incidental imaging of the upper abdomen shows the  visualized portions of the liver, gallbladder, adrenal glands, kidneys, spleen, pancreas, stomach and bowel to be grossly unremarkable. CT PELVIS FINDINGS: Visualized portions of the small bowel, appendix and colon are unremarkable. Uterus and ovaries are visualized. Bladder is unremarkable. Trace atherosclerotic calcification of the arterial vasculature. No pathologically enlarged lymph nodes. No free fluid. Patchy sclerosis is seen throughout the sacrum and left superior pubic ramus, new. L4-5 posterior lumbar interbody fusion. IMPRESSION: 1. New patchy sclerosis in the left superior pubic ramus and bilateral sacrum may be treatment related. Difficult to definitively exclude metastatic disease. Please correlate clinically. 2. Otherwise, no evidence of metastatic disease in the chest or pelvis. Electronically Signed   By: Lorin Picket M.D.   On: 05/26/2014 15:42    Assessment & Plan:   Emily Compton was seen today for hyperlipidemia, gastroesophageal reflux and annual exam.  Diagnoses and all orders for this visit:  Gastroesophageal reflux disease without esophagitis- she has no alarming symptoms or complications and is not anemic, we'll continue the PPIs needed. -     CBC with Differential/Platelet; Future  Chronic midline back pain, unspecified back location  GAD (generalized anxiety disorder) -     diazepam (VALIUM) 10 MG tablet; Take 1 tablet (10 mg total) by mouth every 12 (twelve) hours as needed.  Routine general medical examination at a health care facility- exam completed, labs ordered and reviewed, vaccines reviewed and updated, Pap/colon cancer screening are up-to-date, she was referred for screening mammogram. Patient education material was given. -     Lipid panel; Future  Need for Tdap vaccination -     Tdap vaccine greater than or equal to 7yo IM  Need for vaccination against Streptococcus pneumoniae -     Pneumococcal polysaccharide vaccine 23-valent greater than or equal to  2yo subcutaneous/IM  Hyperlipidemia LDL goal <130- her Framingham risk score is only 2% so I do not recommend a statin for cardiovascular risk reduction. -     Comprehensive metabolic panel; Future -     CBC with Differential/Platelet; Future -     TSH; Future  Chronic bilateral low back pain without sciatica -     oxyCODONE-acetaminophen (PERCOCET/ROXICET) 5-325 MG tablet; Take 1 tablet by mouth every 4 (four) hours as needed for severe pain.  Visit for screening mammogram -     MM DIGITAL SCREENING BILATERAL; Future   I have discontinued Emily Compton's omeprazole, lactobacillus acidophilus, FeCbn-FA-B Complex-A-C-D-E-Min (ACTIVE FE PO), cholecalciferol, docusate sodium, and MAGNESIUM PO. I have also changed her diazepam. Additionally, I am having her maintain her oxyCODONE-acetaminophen.  Meds ordered this encounter  Medications  . diazepam (VALIUM) 10 MG tablet    Sig: Take 1 tablet (10 mg total) by mouth every 12 (twelve) hours as needed.    Dispense:  60 tablet    Refill:  1  . oxyCODONE-acetaminophen (PERCOCET/ROXICET) 5-325 MG tablet    Sig: Take 1 tablet by mouth every 4 (four) hours as needed for severe pain.    Dispense:  75 tablet  Refill:  0     Follow-up: Return in about 6 months (around 08/05/2017).  Scarlette Calico, MD

## 2017-02-02 NOTE — Patient Instructions (Signed)

## 2017-06-12 ENCOUNTER — Other Ambulatory Visit: Payer: Self-pay | Admitting: Internal Medicine

## 2017-06-12 DIAGNOSIS — F411 Generalized anxiety disorder: Secondary | ICD-10-CM

## 2017-08-22 ENCOUNTER — Ambulatory Visit: Payer: BLUE CROSS/BLUE SHIELD | Admitting: Internal Medicine

## 2017-10-19 ENCOUNTER — Other Ambulatory Visit: Payer: Self-pay | Admitting: Internal Medicine

## 2017-10-19 DIAGNOSIS — F411 Generalized anxiety disorder: Secondary | ICD-10-CM

## 2017-12-15 ENCOUNTER — Encounter: Payer: Self-pay | Admitting: Internal Medicine

## 2017-12-18 ENCOUNTER — Other Ambulatory Visit: Payer: Self-pay | Admitting: General Surgery

## 2017-12-18 DIAGNOSIS — Z85048 Personal history of other malignant neoplasm of rectum, rectosigmoid junction, and anus: Secondary | ICD-10-CM

## 2017-12-20 ENCOUNTER — Encounter: Payer: Self-pay | Admitting: Internal Medicine

## 2017-12-20 ENCOUNTER — Ambulatory Visit: Payer: BLUE CROSS/BLUE SHIELD | Admitting: Internal Medicine

## 2017-12-20 VITALS — BP 110/72 | HR 54 | Ht 60.0 in | Wt 131.0 lb

## 2017-12-20 DIAGNOSIS — G8929 Other chronic pain: Secondary | ICD-10-CM

## 2017-12-20 DIAGNOSIS — M545 Low back pain: Secondary | ICD-10-CM

## 2017-12-20 DIAGNOSIS — M48061 Spinal stenosis, lumbar region without neurogenic claudication: Secondary | ICD-10-CM | POA: Diagnosis not present

## 2017-12-20 MED ORDER — OXYCODONE-ACETAMINOPHEN 5-325 MG PO TABS: 1.0000 | ORAL_TABLET | ORAL | 0 refills | Status: DC | PRN

## 2017-12-20 NOTE — Progress Notes (Signed)
Subjective:  Patient ID: Emily Compton, female    DOB: 08-13-1955  Age: 62 y.o. MRN: [id number]  CC: Back Pain   HPI Emily Compton presents for f/up regarding chronic, intermittent LBP 7 yrs s/p L-fusion for spinal stenosis.  The pain does not radiate into her lower extremities and she denies numbness, weakness, or tingling.  She takes occasional dose of Percocet and gets adequate symptom relief.  Denies any recent trauma or injury and says the pain is not worsening.  Outpatient Medications Prior to Visit  Medication Sig Dispense Refill  . diazepam (VALIUM) 10 MG tablet TAKE 1 TABLET BY MOUTH EVERY 12 HOURS AS NEEDED 60 tablet 2  . oxyCODONE-acetaminophen (PERCOCET/ROXICET) 5-325 MG tablet Take 1 tablet by mouth every 4 (four) hours as needed for severe pain. 75 tablet 0   No facility-administered medications prior to visit.     ROS Review of Systems  Constitutional: Negative.  Negative for appetite change, chills, fatigue and fever.  HENT: Negative.   Eyes: Negative for visual disturbance.  Respiratory: Negative for cough, chest tightness, shortness of breath and wheezing.   Cardiovascular: Negative for chest pain and leg swelling.  Gastrointestinal: Negative for abdominal pain, constipation, diarrhea, nausea and vomiting.  Endocrine: Negative.   Genitourinary: Negative.  Negative for difficulty urinating.  Musculoskeletal: Positive for back pain. Negative for myalgias and neck pain.  Skin: Negative.   Neurological: Negative.  Negative for dizziness, weakness, light-headedness and numbness.  Hematological: Negative for adenopathy. Does not bruise/bleed easily.  Psychiatric/Behavioral: Negative.     Objective:  BP 110/72 (BP Location: Left Arm, Patient Position: Sitting, Cuff Size: Normal)   Pulse (!) 54   Ht 5' (1.524 m)   Wt 131 lb (59.4 kg)   SpO2 96%   BMI 25.58 kg/m   BP Readings from Last 3 Encounters:  12/20/17 110/72  02/02/17 120/70  09/01/15 100/60      Wt Readings from Last 3 Encounters:  12/20/17 131 lb (59.4 kg)  02/02/17 131 lb 4 oz (59.5 kg)  09/01/15 127 lb (57.6 kg)    Physical Exam  Constitutional: She is oriented to person, place, and time. No distress.  HENT:  Mouth/Throat: Oropharynx is clear and moist. No oropharyngeal exudate.  Eyes: Conjunctivae are normal. No scleral icterus.  Neck: Normal range of motion. Neck supple. No JVD present. No thyromegaly present.  Cardiovascular: Normal rate, regular rhythm and normal heart sounds. Exam reveals no gallop.  No murmur heard. Pulmonary/Chest: Effort normal and breath sounds normal. No respiratory distress. She has no wheezes. She has no rales.  Abdominal: Soft. Bowel sounds are normal. She exhibits no mass. There is no tenderness.  Musculoskeletal: Normal range of motion. She exhibits no edema, tenderness or deformity.       Lumbar back: Normal. She exhibits normal range of motion, no tenderness, no bony tenderness, no swelling, no edema and no deformity.  Neurological: She is alert and oriented to person, place, and time. She has normal strength. She displays no atrophy and no tremor. No cranial nerve deficit or sensory deficit. She displays a negative Romberg sign. She displays no seizure activity. Gait normal.  Reflex Scores:      Tricep reflexes are 1+ on the right side and 1+ on the left side.      Bicep reflexes are 1+ on the left side.      Brachioradialis reflexes are 1+ on the right side and 1+ on the left side.      Patellar  reflexes are 2+ on the right side and 2+ on the left side.      Achilles reflexes are 1+ on the right side and 1+ on the left side. NEG SLR in BLE  Skin: Skin is warm and dry. She is not diaphoretic.  Vitals reviewed.   Lab Results  Component Value Date   WBC 5.2 02/02/2017   HGB 13.0 02/02/2017   HCT 38.8 02/02/2017   PLT 254.0 02/02/2017   GLUCOSE 99 02/02/2017   CHOL 269 (H) 02/02/2017   TRIG 81.0 02/02/2017   HDL 92.10 02/02/2017    LDLDIRECT 157.5 02/25/2013   LDLCALC 160 (H) 02/02/2017   ALT 14 02/02/2017   AST 18 02/02/2017   NA 138 02/02/2017   K 4.3 02/02/2017   CL 103 02/02/2017   CREATININE 0.83 02/02/2017   BUN 18 02/02/2017   CO2 29 02/02/2017   TSH 2.15 02/02/2017   INR 0.95 02/24/2011    Ct Chest W Contrast  Result Date: 05/26/2014 CLINICAL DATA:  HISTORY OF ANAL CANCER, 1 YEAR FOLLOW-UP, EVALUATE FOR METASTATIC DISEASE. HEMORRHOID BANDING. RADIATION AND CHEMOTHERAPY COMPLETED 07/05/2013. EXAM: CT CHEST AND PELVIS WITH CONTRAST TECHNIQUE: Multidetector CT imaging of the chest and pelvis was performed using the standard protocol following the bolus administration of intravenous contrast. CONTRAST:  153mL OMNIPAQUE IOHEXOL 300 MG/ML  SOLN COMPARISON:  CT ABDOMEN 05/12/2014, PET 05/22/2013 AND CT CHEST ABDOMEN PELVIS 05/06/2013. CT CHEST FINDINGS: A sub cm partially calcified nodule is again seen in the left lobe of the thyroid. No pathologically enlarged mediastinal, hilar or axillary lymph nodes. Heart size within normal limits. No pericardial effusion. Lungs are clear.  No pleural fluid.  Airway is unremarkable. Incidental imaging of the upper abdomen shows the visualized portions of the liver, gallbladder, adrenal glands, kidneys, spleen, pancreas, stomach and bowel to be grossly unremarkable. CT PELVIS FINDINGS: Visualized portions of the small bowel, appendix and colon are unremarkable. Uterus and ovaries are visualized. Bladder is unremarkable. Trace atherosclerotic calcification of the arterial vasculature. No pathologically enlarged lymph nodes. No free fluid. Patchy sclerosis is seen throughout the sacrum and left superior pubic ramus, new. L4-5 posterior lumbar interbody fusion. IMPRESSION: 1. New patchy sclerosis in the left superior pubic ramus and bilateral sacrum may be treatment related. Difficult to definitively exclude metastatic disease. Please correlate clinically. 2. Otherwise, no evidence of  metastatic disease in the chest or pelvis. Electronically Signed   By: Lorin Picket M.D.   On: 05/26/2014 15:42   Ct Pelvis W Contrast  Result Date: 05/26/2014 CLINICAL DATA:  HISTORY OF ANAL CANCER, 1 YEAR FOLLOW-UP, EVALUATE FOR METASTATIC DISEASE. HEMORRHOID BANDING. RADIATION AND CHEMOTHERAPY COMPLETED 07/05/2013. EXAM: CT CHEST AND PELVIS WITH CONTRAST TECHNIQUE: Multidetector CT imaging of the chest and pelvis was performed using the standard protocol following the bolus administration of intravenous contrast. CONTRAST:  175mL OMNIPAQUE IOHEXOL 300 MG/ML  SOLN COMPARISON:  CT ABDOMEN 05/12/2014, PET 05/22/2013 AND CT CHEST ABDOMEN PELVIS 05/06/2013. CT CHEST FINDINGS: A sub cm partially calcified nodule is again seen in the left lobe of the thyroid. No pathologically enlarged mediastinal, hilar or axillary lymph nodes. Heart size within normal limits. No pericardial effusion. Lungs are clear.  No pleural fluid.  Airway is unremarkable. Incidental imaging of the upper abdomen shows the visualized portions of the liver, gallbladder, adrenal glands, kidneys, spleen, pancreas, stomach and bowel to be grossly unremarkable. CT PELVIS FINDINGS: Visualized portions of the small bowel, appendix and colon are unremarkable. Uterus and ovaries are visualized.  Bladder is unremarkable. Trace atherosclerotic calcification of the arterial vasculature. No pathologically enlarged lymph nodes. No free fluid. Patchy sclerosis is seen throughout the sacrum and left superior pubic ramus, new. L4-5 posterior lumbar interbody fusion. IMPRESSION: 1. New patchy sclerosis in the left superior pubic ramus and bilateral sacrum may be treatment related. Difficult to definitively exclude metastatic disease. Please correlate clinically. 2. Otherwise, no evidence of metastatic disease in the chest or pelvis. Electronically Signed   By: Lorin Picket M.D.   On: 05/26/2014 15:42    Assessment & Plan:   Emily Compton was seen today  for back pain.  Diagnoses and all orders for this visit:  Spinal stenosis, lumbar region without neurogenic claudication- She is neurologically intact.  She takes the occasional dose of Percocet with no signs of misuse or opiate use disorder.  Will continue Percocet at the current dose to control her pain. -     oxyCODONE-acetaminophen (PERCOCET/ROXICET) 5-325 MG tablet; Take 1 tablet by mouth every 4 (four) hours as needed for severe pain.  Chronic bilateral low back pain without sciatica- as above   I am having Emily Compton maintain her diazepam and oxyCODONE-acetaminophen.  Meds ordered this encounter  Medications  . oxyCODONE-acetaminophen (PERCOCET/ROXICET) 5-325 MG tablet    Sig: Take 1 tablet by mouth every 4 (four) hours as needed for severe pain.    Dispense:  75 tablet    Refill:  0     Follow-up: Return in about 3 months (around 03/22/2018).  Scarlette Calico, MD

## 2017-12-20 NOTE — Patient Instructions (Signed)

## 2017-12-21 ENCOUNTER — Encounter: Payer: Self-pay | Admitting: Internal Medicine

## 2017-12-28 ENCOUNTER — Other Ambulatory Visit: Payer: BLUE CROSS/BLUE SHIELD

## 2018-04-11 ENCOUNTER — Other Ambulatory Visit: Payer: Self-pay | Admitting: Internal Medicine

## 2018-04-11 DIAGNOSIS — F411 Generalized anxiety disorder: Secondary | ICD-10-CM

## 2018-05-21 ENCOUNTER — Other Ambulatory Visit: Payer: Self-pay | Admitting: Internal Medicine

## 2018-05-21 DIAGNOSIS — M48061 Spinal stenosis, lumbar region without neurogenic claudication: Secondary | ICD-10-CM

## 2018-05-23 MED ORDER — OXYCODONE-ACETAMINOPHEN 5-325 MG PO TABS: 1.0000 | ORAL_TABLET | ORAL | 0 refills | Status: DC | PRN

## 2018-08-20 ENCOUNTER — Other Ambulatory Visit: Payer: Self-pay | Admitting: Internal Medicine

## 2018-08-20 DIAGNOSIS — F411 Generalized anxiety disorder: Secondary | ICD-10-CM

## 2019-01-02 ENCOUNTER — Telehealth: Payer: Self-pay | Admitting: Internal Medicine

## 2019-01-02 NOTE — Telephone Encounter (Signed)
LVM for patient to call and make an appt, informed we will not refill until appt is scheduled

## 2019-01-02 NOTE — Telephone Encounter (Signed)
Pt is way over due for an appointment.   Can you call her please?

## 2019-01-02 NOTE — Telephone Encounter (Signed)
Copied from [place] [id number]. Topic: Quick Communication - Rx Refill/Question >> Jan 02, 2019  9:09 AM Virl Axe D wrote: Medication: diazepam (VALIUM) 10 MG tablet / oxyCODONE-acetaminophen (PERCOCET/ROXICET) 5-325 MG tablet   Has the patient contacted their pharmacy? Yes.   (Agent: If no, request that the patient contact the pharmacy for the refill.) (Agent: If yes, when and what did the pharmacy advise?)  Preferred Pharmacy (with phone number or street name): [facility], [place] [phone] (Phone) [phone] (Fax)    Agent: Please be advised that RX refills may take up to 3 business days. We ask that you follow-up with your pharmacy.

## 2019-01-03 NOTE — Telephone Encounter (Signed)
Is pt a candidate for virtual visit? She is over due to see you and is requesting rx rf.

## 2019-01-03 NOTE — Telephone Encounter (Signed)
Patient called back. She could not understand why she needed an appointment. I explained to her that she has not been seen in over a year and will need an appointment for any refills.  She asked if this could be done virtually because she does not want to come into the office. Please advise.

## 2019-01-03 NOTE — Telephone Encounter (Signed)
She is due for her annual visit with labs I prefer to see her in person  TJ

## 2019-01-03 NOTE — Telephone Encounter (Signed)
Pt has scheduled for 06/17 at 11:30

## 2019-01-16 ENCOUNTER — Encounter: Payer: Self-pay | Admitting: Internal Medicine

## 2019-01-16 ENCOUNTER — Ambulatory Visit (INDEPENDENT_AMBULATORY_CARE_PROVIDER_SITE_OTHER): Payer: Medicare Other | Admitting: Internal Medicine

## 2019-01-16 ENCOUNTER — Other Ambulatory Visit: Payer: Self-pay

## 2019-01-16 ENCOUNTER — Other Ambulatory Visit (INDEPENDENT_AMBULATORY_CARE_PROVIDER_SITE_OTHER): Payer: Medicare Other

## 2019-01-16 VITALS — BP 120/74 | HR 62 | Temp 99.1°F | Resp 16 | Ht 60.0 in | Wt 132.0 lb

## 2019-01-16 DIAGNOSIS — Z1231 Encounter for screening mammogram for malignant neoplasm of breast: Secondary | ICD-10-CM

## 2019-01-16 DIAGNOSIS — M48061 Spinal stenosis, lumbar region without neurogenic claudication: Secondary | ICD-10-CM | POA: Diagnosis not present

## 2019-01-16 DIAGNOSIS — M818 Other osteoporosis without current pathological fracture: Secondary | ICD-10-CM | POA: Diagnosis not present

## 2019-01-16 DIAGNOSIS — E785 Hyperlipidemia, unspecified: Secondary | ICD-10-CM | POA: Diagnosis not present

## 2019-01-16 DIAGNOSIS — F411 Generalized anxiety disorder: Secondary | ICD-10-CM | POA: Diagnosis not present

## 2019-01-16 DIAGNOSIS — K219 Gastro-esophageal reflux disease without esophagitis: Secondary | ICD-10-CM

## 2019-01-16 DIAGNOSIS — N183 Chronic kidney disease, stage 3 unspecified: Secondary | ICD-10-CM | POA: Insufficient documentation

## 2019-01-16 DIAGNOSIS — Z124 Encounter for screening for malignant neoplasm of cervix: Secondary | ICD-10-CM | POA: Insufficient documentation

## 2019-01-16 LAB — VITAMIN D 25 HYDROXY (VIT D DEFICIENCY, FRACTURES): VITD: 57.74 ng/mL (ref 30.00–100.00)

## 2019-01-16 LAB — CBC WITH DIFFERENTIAL/PLATELET
Basophils Absolute: 0 10*3/uL (ref 0.0–0.1)
Basophils Relative: 0.9 % (ref 0.0–3.0)
Eosinophils Absolute: 0.1 10*3/uL (ref 0.0–0.7)
Eosinophils Relative: 2 % (ref 0.0–5.0)
HCT: 40.2 % (ref 36.0–46.0)
Hemoglobin: 13.3 g/dL (ref 12.0–15.0)
Lymphocytes Relative: 34.4 % (ref 12.0–46.0)
Lymphs Abs: 1.4 10*3/uL (ref 0.7–4.0)
MCHC: 33.1 g/dL (ref 30.0–36.0)
MCV: 87 fl (ref 78.0–100.0)
Monocytes Absolute: 0.5 10*3/uL (ref 0.1–1.0)
Monocytes Relative: 11.7 % (ref 3.0–12.0)
Neutro Abs: 2.2 10*3/uL (ref 1.4–7.7)
Neutrophils Relative %: 51 % (ref 43.0–77.0)
Platelets: 271 10*3/uL (ref 150.0–400.0)
RBC: 4.62 Mil/uL (ref 3.87–5.11)
RDW: 14.1 % (ref 11.5–15.5)
WBC: 4.2 10*3/uL (ref 4.0–10.5)

## 2019-01-16 LAB — COMPREHENSIVE METABOLIC PANEL
ALT: 11 U/L (ref 0–35)
AST: 16 U/L (ref 0–37)
Albumin: 4.4 g/dL (ref 3.5–5.2)
Alkaline Phosphatase: 57 U/L (ref 39–117)
BUN: 15 mg/dL (ref 6–23)
CO2: 29 mEq/L (ref 19–32)
Calcium: 9.6 mg/dL (ref 8.4–10.5)
Chloride: 101 mEq/L (ref 96–112)
Creatinine, Ser: 0.95 mg/dL (ref 0.40–1.20)
GFR: 59.04 mL/min — ABNORMAL LOW (ref >60.00)
Glucose, Bld: 87 mg/dL (ref 70–99)
Potassium: 4.3 mEq/L (ref 3.5–5.1)
Sodium: 137 mEq/L (ref 135–145)
Total Bilirubin: 0.3 mg/dL (ref 0.2–1.2)
Total Protein: 7.1 g/dL (ref 6.0–8.3)

## 2019-01-16 LAB — LIPID PANEL
Cholesterol: 273 mg/dL — ABNORMAL HIGH (ref 0–200)
HDL: 80.7 mg/dL (ref >39.00)
LDL Cholesterol: 176 mg/dL — ABNORMAL HIGH (ref 0–99)
NonHDL: 192.26
Total CHOL/HDL Ratio: 3
Triglycerides: 82 mg/dL (ref 0.0–149.0)
VLDL: 16.4 mg/dL (ref 0.0–40.0)

## 2019-01-16 LAB — TSH: TSH: 2.84 u[IU]/mL (ref 0.35–4.50)

## 2019-01-16 MED ORDER — OXYCODONE-ACETAMINOPHEN 5-325 MG PO TABS: 1.0000 | ORAL_TABLET | ORAL | 0 refills | Status: DC | PRN

## 2019-01-16 MED ORDER — DIAZEPAM 10 MG PO TABS: 10.0000 mg | ORAL_TABLET | Freq: Two times a day (BID) | ORAL | 3 refills | Status: DC | PRN

## 2019-01-16 NOTE — Progress Notes (Signed)
Subjective:  Patient ID: Emily Compton, female    DOB: 17-Jul-1956  Age: 63 y.o. MRN: [id number]  CC: Hyperlipidemia and Back Pain   HPI Emily Compton presents for f/up - She has chronic unchanged low back pain with spasms that occasionally radiates into her right hip and thigh. She gets symptom relief with the combination of diazepam, oxycodone, and acetaminophen.  She denies paresthesias in her lower extremities.  Outpatient Medications Prior to Visit  Medication Sig Dispense Refill   diazepam (VALIUM) 10 MG tablet TAKE 1 TABLET BY MOUTH EVERY 12 HOURS AS NEEDED 60 tablet 1   oxyCODONE-acetaminophen (PERCOCET/ROXICET) 5-325 MG tablet Take 1 tablet by mouth every 4 (four) hours as needed for severe pain. 75 tablet 0   No facility-administered medications prior to visit.     ROS Review of Systems  Constitutional: Negative.  Negative for diaphoresis, fatigue and unexpected weight change.  HENT: Negative.  Negative for trouble swallowing.   Eyes: Negative.   Respiratory: Negative for cough, chest tightness, shortness of breath and wheezing.   Cardiovascular: Negative for chest pain, palpitations and leg swelling.  Gastrointestinal: Negative for abdominal pain, constipation, diarrhea, nausea and vomiting.  Endocrine: Negative.   Genitourinary: Negative.  Negative for difficulty urinating.  Musculoskeletal: Positive for back pain. Negative for arthralgias, myalgias and neck pain.  Skin: Negative.  Negative for color change and pallor.  Neurological: Negative.  Negative for dizziness, weakness, light-headedness and numbness.  Hematological: Negative for adenopathy. Does not bruise/bleed easily.  Psychiatric/Behavioral: Negative for agitation, behavioral problems, confusion, decreased concentration, dysphoric mood and suicidal ideas. The patient is nervous/anxious.     Objective:  BP 120/74 (BP Location: Left Arm, Patient Position: Sitting, Cuff Size: Normal)    Pulse 62     Temp 99.1 F (37.3 C) (Oral)    Resp 16    Ht 5' (1.524 m)    Wt 132 lb (59.9 kg)    SpO2 96%    BMI 25.78 kg/m   BP Readings from Last 3 Encounters:  01/16/19 120/74  12/20/17 110/72  02/02/17 120/70    Wt Readings from Last 3 Encounters:  01/16/19 132 lb (59.9 kg)  12/20/17 131 lb (59.4 kg)  02/02/17 131 lb 4 oz (59.5 kg)    Physical Exam Vitals signs reviewed.  Constitutional:      Appearance: She is not ill-appearing or diaphoretic.  HENT:     Nose: Nose normal.     Mouth/Throat:     Mouth: Mucous membranes are moist.     Pharynx: No posterior oropharyngeal erythema.  Eyes:     General: No scleral icterus.    Conjunctiva/sclera: Conjunctivae normal.  Neck:     Musculoskeletal: Normal range of motion. No neck rigidity.  Cardiovascular:     Rate and Rhythm: Normal rate and regular rhythm.     Heart sounds: No murmur. No gallop.   Pulmonary:     Effort: Pulmonary effort is normal.     Breath sounds: No stridor. No wheezing, rhonchi or rales.  Abdominal:     General: Abdomen is flat.     Palpations: There is no hepatomegaly, splenomegaly or mass.     Tenderness: There is no abdominal tenderness. There is no guarding.  Musculoskeletal: Normal range of motion.     Right lower leg: No edema.     Left lower leg: No edema.  Lymphadenopathy:     Cervical: No cervical adenopathy.  Skin:    General: Skin is warm and dry.  Neurological:     General: No focal deficit present.     Motor: No weakness or atrophy.     Coordination: Coordination is intact. Coordination normal.     Deep Tendon Reflexes: Reflexes normal.     Comments: Neg SLR in BLE  Psychiatric:        Mood and Affect: Mood normal.        Behavior: Behavior normal.     Lab Results  Component Value Date   WBC 4.2 01/16/2019   HGB 13.3 01/16/2019   HCT 40.2 01/16/2019   PLT 271.0 01/16/2019   GLUCOSE 87 01/16/2019   CHOL 273 (H) 01/16/2019   TRIG 82.0 01/16/2019   HDL 80.70 01/16/2019   LDLDIRECT  157.5 02/25/2013   LDLCALC 176 (H) 01/16/2019   ALT 11 01/16/2019   AST 16 01/16/2019   NA 137 01/16/2019   K 4.3 01/16/2019   CL 101 01/16/2019   CREATININE 0.95 01/16/2019   BUN 15 01/16/2019   CO2 29 01/16/2019   TSH 2.84 01/16/2019   INR 0.95 02/24/2011    Ct Chest W Contrast  Result Date: 05/26/2014 CLINICAL DATA:  HISTORY OF ANAL CANCER, 1 YEAR FOLLOW-UP, EVALUATE FOR METASTATIC DISEASE. HEMORRHOID BANDING. RADIATION AND CHEMOTHERAPY COMPLETED 07/05/2013. EXAM: CT CHEST AND PELVIS WITH CONTRAST TECHNIQUE: Multidetector CT imaging of the chest and pelvis was performed using the standard protocol following the bolus administration of intravenous contrast. CONTRAST:  150mL OMNIPAQUE IOHEXOL 300 MG/ML  SOLN COMPARISON:  CT ABDOMEN 05/12/2014, PET 05/22/2013 AND CT CHEST ABDOMEN PELVIS 05/06/2013. CT CHEST FINDINGS: A sub cm partially calcified nodule is again seen in the left lobe of the thyroid. No pathologically enlarged mediastinal, hilar or axillary lymph nodes. Heart size within normal limits. No pericardial effusion. Lungs are clear.  No pleural fluid.  Airway is unremarkable. Incidental imaging of the upper abdomen shows the visualized portions of the liver, gallbladder, adrenal glands, kidneys, spleen, pancreas, stomach and bowel to be grossly unremarkable. CT PELVIS FINDINGS: Visualized portions of the small bowel, appendix and colon are unremarkable. Uterus and ovaries are visualized. Bladder is unremarkable. Trace atherosclerotic calcification of the arterial vasculature. No pathologically enlarged lymph nodes. No free fluid. Patchy sclerosis is seen throughout the sacrum and left superior pubic ramus, new. L4-5 posterior lumbar interbody fusion. IMPRESSION: 1. New patchy sclerosis in the left superior pubic ramus and bilateral sacrum may be treatment related. Difficult to definitively exclude metastatic disease. Please correlate clinically. 2. Otherwise, no evidence of metastatic  disease in the chest or pelvis. Electronically Signed   By: Lorin Picket M.D.   On: 05/26/2014 15:42   Ct Pelvis W Contrast  Result Date: 05/26/2014 CLINICAL DATA:  HISTORY OF ANAL CANCER, 1 YEAR FOLLOW-UP, EVALUATE FOR METASTATIC DISEASE. HEMORRHOID BANDING. RADIATION AND CHEMOTHERAPY COMPLETED 07/05/2013. EXAM: CT CHEST AND PELVIS WITH CONTRAST TECHNIQUE: Multidetector CT imaging of the chest and pelvis was performed using the standard protocol following the bolus administration of intravenous contrast. CONTRAST:  139mL OMNIPAQUE IOHEXOL 300 MG/ML  SOLN COMPARISON:  CT ABDOMEN 05/12/2014, PET 05/22/2013 AND CT CHEST ABDOMEN PELVIS 05/06/2013. CT CHEST FINDINGS: A sub cm partially calcified nodule is again seen in the left lobe of the thyroid. No pathologically enlarged mediastinal, hilar or axillary lymph nodes. Heart size within normal limits. No pericardial effusion. Lungs are clear.  No pleural fluid.  Airway is unremarkable. Incidental imaging of the upper abdomen shows the visualized portions of the liver, gallbladder, adrenal glands, kidneys, spleen, pancreas, stomach and bowel  to be grossly unremarkable. CT PELVIS FINDINGS: Visualized portions of the small bowel, appendix and colon are unremarkable. Uterus and ovaries are visualized. Bladder is unremarkable. Trace atherosclerotic calcification of the arterial vasculature. No pathologically enlarged lymph nodes. No free fluid. Patchy sclerosis is seen throughout the sacrum and left superior pubic ramus, new. L4-5 posterior lumbar interbody fusion. IMPRESSION: 1. New patchy sclerosis in the left superior pubic ramus and bilateral sacrum may be treatment related. Difficult to definitively exclude metastatic disease. Please correlate clinically. 2. Otherwise, no evidence of metastatic disease in the chest or pelvis. Electronically Signed   By: Lorin Picket M.D.   On: 05/26/2014 15:42    Assessment & Plan:   Emily Compton was seen today for  hyperlipidemia and back pain.  Diagnoses and all orders for this visit:  Gastroesophageal reflux disease without esophagitis- She is having no problems with this and is not currently treating it. -     CBC with Differential/Platelet; Future  Osteoporosis of disuse -     VITAMIN D 25 Hydroxy (Vit-D Deficiency, Fractures); Future -     DG Bone Density; Future  GAD (generalized anxiety disorder) -     diazepam (VALIUM) 10 MG tablet; Take 1 tablet (10 mg total) by mouth every 12 (twelve) hours as needed.  Hyperlipidemia with target LDL less than 160- She does not have an elevated ASCVD risk score so I do not recommend a statin for CV risk reduction. -     Comprehensive metabolic panel; Future -     Lipid panel; Future -     TSH; Future  Visit for screening mammogram -     MM DIGITAL SCREENING BILATERAL; Future  Spinal stenosis of lumbar region without neurogenic claudication  Screening for cervical cancer -     Ambulatory referral to Gynecology  Spinal stenosis, lumbar region without neurogenic claudication -     diazepam (VALIUM) 10 MG tablet; Take 1 tablet (10 mg total) by mouth every 12 (twelve) hours as needed. -     oxyCODONE-acetaminophen (PERCOCET/ROXICET) 5-325 MG tablet; Take 1 tablet by mouth every 4 (four) hours as needed for severe pain.  Chronic renal disease, stage 3, moderately decreased glomerular filtration rate (GFR) between 30-59 mL/min/1.73 square meter ([place])- She has developed mild renal insufficiency.  She was encouraged to avoid nephrotoxic agents.   I have changed Emily Compton's diazepam. I am also having her maintain her oxyCODONE-acetaminophen.  Meds ordered this encounter  Medications   diazepam (VALIUM) 10 MG tablet    Sig: Take 1 tablet (10 mg total) by mouth every 12 (twelve) hours as needed.    Dispense:  60 tablet    Refill:  3   oxyCODONE-acetaminophen (PERCOCET/ROXICET) 5-325 MG tablet    Sig: Take 1 tablet by mouth every 4 (four) hours  as needed for severe pain.    Dispense:  75 tablet    Refill:  0     Follow-up: No follow-ups on file.  Scarlette Calico, MD

## 2019-01-22 DIAGNOSIS — H04123 Dry eye syndrome of bilateral lacrimal glands: Secondary | ICD-10-CM | POA: Diagnosis not present

## 2019-01-22 DIAGNOSIS — H43811 Vitreous degeneration, right eye: Secondary | ICD-10-CM | POA: Diagnosis not present

## 2019-04-23 DIAGNOSIS — Z85828 Personal history of other malignant neoplasm of skin: Secondary | ICD-10-CM | POA: Diagnosis not present

## 2019-04-23 DIAGNOSIS — D225 Melanocytic nevi of trunk: Secondary | ICD-10-CM | POA: Diagnosis not present

## 2019-04-23 DIAGNOSIS — L821 Other seborrheic keratosis: Secondary | ICD-10-CM | POA: Diagnosis not present

## 2019-04-23 DIAGNOSIS — L918 Other hypertrophic disorders of the skin: Secondary | ICD-10-CM | POA: Diagnosis not present

## 2019-04-23 DIAGNOSIS — L820 Inflamed seborrheic keratosis: Secondary | ICD-10-CM | POA: Diagnosis not present

## 2019-04-23 DIAGNOSIS — B078 Other viral warts: Secondary | ICD-10-CM | POA: Diagnosis not present

## 2019-04-23 DIAGNOSIS — L738 Other specified follicular disorders: Secondary | ICD-10-CM | POA: Diagnosis not present

## 2019-04-23 DIAGNOSIS — L245 Irritant contact dermatitis due to other chemical products: Secondary | ICD-10-CM | POA: Diagnosis not present

## 2019-07-16 ENCOUNTER — Other Ambulatory Visit: Payer: Self-pay | Admitting: Internal Medicine

## 2019-07-16 DIAGNOSIS — M48061 Spinal stenosis, lumbar region without neurogenic claudication: Secondary | ICD-10-CM

## 2019-07-16 DIAGNOSIS — F411 Generalized anxiety disorder: Secondary | ICD-10-CM

## 2019-07-16 MED ORDER — OXYCODONE-ACETAMINOPHEN 5-325 MG PO TABS: 1.0000 | ORAL_TABLET | ORAL | 0 refills | Status: DC | PRN

## 2019-10-10 DIAGNOSIS — H524 Presbyopia: Secondary | ICD-10-CM | POA: Diagnosis not present

## 2019-10-10 DIAGNOSIS — H04123 Dry eye syndrome of bilateral lacrimal glands: Secondary | ICD-10-CM | POA: Diagnosis not present

## 2019-10-10 DIAGNOSIS — H2513 Age-related nuclear cataract, bilateral: Secondary | ICD-10-CM | POA: Diagnosis not present

## 2020-02-09 ENCOUNTER — Other Ambulatory Visit: Payer: Self-pay | Admitting: Internal Medicine

## 2020-02-09 DIAGNOSIS — F411 Generalized anxiety disorder: Secondary | ICD-10-CM

## 2020-02-09 DIAGNOSIS — M48061 Spinal stenosis, lumbar region without neurogenic claudication: Secondary | ICD-10-CM

## 2020-03-23 ENCOUNTER — Ambulatory Visit (INDEPENDENT_AMBULATORY_CARE_PROVIDER_SITE_OTHER): Payer: Medicare Other | Admitting: Internal Medicine

## 2020-03-23 ENCOUNTER — Other Ambulatory Visit: Payer: Self-pay

## 2020-03-23 ENCOUNTER — Encounter: Payer: Self-pay | Admitting: Internal Medicine

## 2020-03-23 VITALS — BP 130/80 | HR 64 | Temp 97.6°F | Resp 16 | Ht 60.0 in | Wt 131.5 lb

## 2020-03-23 DIAGNOSIS — K219 Gastro-esophageal reflux disease without esophagitis: Secondary | ICD-10-CM | POA: Diagnosis not present

## 2020-03-23 DIAGNOSIS — E785 Hyperlipidemia, unspecified: Secondary | ICD-10-CM

## 2020-03-23 DIAGNOSIS — F411 Generalized anxiety disorder: Secondary | ICD-10-CM | POA: Diagnosis not present

## 2020-03-23 DIAGNOSIS — N1831 Chronic kidney disease, stage 3a: Secondary | ICD-10-CM | POA: Diagnosis not present

## 2020-03-23 DIAGNOSIS — C210 Malignant neoplasm of anus, unspecified: Secondary | ICD-10-CM | POA: Diagnosis not present

## 2020-03-23 DIAGNOSIS — M48061 Spinal stenosis, lumbar region without neurogenic claudication: Secondary | ICD-10-CM

## 2020-03-23 DIAGNOSIS — Z1231 Encounter for screening mammogram for malignant neoplasm of breast: Secondary | ICD-10-CM

## 2020-03-23 MED ORDER — DIAZEPAM 10 MG PO TABS: 10.0000 mg | ORAL_TABLET | Freq: Two times a day (BID) | ORAL | 2 refills | Status: DC | PRN

## 2020-03-23 MED ORDER — OXYCODONE-ACETAMINOPHEN 5-325 MG PO TABS: 1.0000 | ORAL_TABLET | ORAL | 0 refills | Status: DC | PRN

## 2020-03-23 NOTE — Progress Notes (Signed)
Subjective:  Patient ID: Emily Compton, female    DOB: 1956/07/25  Age: 64 y.o. MRN: [id number]  CC: Annual Exam  This visit occurred during the SARS-CoV-2 public health emergency.  Safety protocols were in place, including screening questions prior to the visit, additional usage of staff PPE, and extensive cleaning of exam room while observing appropriate contact time as indicated for disinfecting solutions.    HPI [name] presents for a CPX.  She is active and denies any recent episodes of chest pain, shortness of breath, palpitations, edema, or fatigue.  Outpatient Medications Prior to Visit  Medication Sig Dispense Refill  . VITAMIN D PO Take by mouth.    . diazepam (VALIUM) 10 MG tablet TAKE 1 TABLET BY MOUTH EVERY 12 HOURS AS NEEDED 60 tablet 0  . oxyCODONE-acetaminophen (PERCOCET/ROXICET) 5-325 MG tablet Take 1 tablet by mouth every 4 (four) hours as needed for severe pain. 75 tablet 0   No facility-administered medications prior to visit.    ROS Review of Systems  Constitutional: Negative.   HENT: Negative.  Negative for sore throat and trouble swallowing.   Eyes: Negative.   Respiratory: Negative for cough, chest tightness, shortness of breath and wheezing.   Gastrointestinal: Positive for blood in stool. Negative for abdominal pain, anal bleeding, diarrhea, nausea and vomiting.       She had a brief episode of bright red blood per rectum about 3 months ago.  She has had no recurrences since then.  Endocrine: Negative.   Genitourinary: Negative.  Negative for decreased urine volume, difficulty urinating, dysuria, hematuria and urgency.  Musculoskeletal: Positive for back pain. Negative for joint swelling, myalgias and neck pain.       She complains of chronic, intermittent, nonradiating low back pain.  She denies lower extremity paresthesias.  She gets adequate symptom relief with the occasional dose of Percocet.  She requests a refill.  Skin: Negative.   Negative for color change.  Neurological: Negative.  Negative for dizziness, weakness, light-headedness and numbness.  Hematological: Negative for adenopathy. Does not bruise/bleed easily.  Psychiatric/Behavioral: Negative for behavioral problems, decreased concentration, dysphoric mood, sleep disturbance and suicidal ideas. The patient is nervous/anxious.        She has rare episodes of anxiety.  She requests a refill of diazepam.    Objective:  BP 130/80 (BP Location: Left Arm, Patient Position: Sitting, Cuff Size: Normal)   Pulse 64   Temp 97.6 F (36.4 C) (Oral)   Resp 16   Ht 5' (1.524 m)   Wt 131 lb 8 oz (59.6 kg)   SpO2 97%   BMI 25.68 kg/m   BP Readings from Last 3 Encounters:  03/23/20 130/80  01/16/19 120/74  12/20/17 110/72    Wt Readings from Last 3 Encounters:  03/23/20 131 lb 8 oz (59.6 kg)  01/16/19 132 lb (59.9 kg)  12/20/17 131 lb (59.4 kg)    Physical Exam Vitals reviewed.  Constitutional:      Appearance: Normal appearance.  HENT:     Nose: Nose normal.  Eyes:     General: No scleral icterus.    Conjunctiva/sclera: Conjunctivae normal.  Cardiovascular:     Rate and Rhythm: Normal rate.     Heart sounds: No murmur heard.   Pulmonary:     Effort: Pulmonary effort is normal.     Breath sounds: No stridor. No wheezing, rhonchi or rales.  Abdominal:     General: Abdomen is flat.     Palpations: There  is no mass.     Tenderness: There is no abdominal tenderness. There is no guarding.  Musculoskeletal:        General: Normal range of motion.     Cervical back: Neck supple.     Right lower leg: No edema.     Left lower leg: No edema.  Lymphadenopathy:     Cervical: No cervical adenopathy.  Skin:    General: Skin is warm and dry.     Coloration: Skin is not pale.  Neurological:     General: No focal deficit present.     Mental Status: She is alert.  Psychiatric:        Mood and Affect: Mood normal.        Behavior: Behavior normal.         Thought Content: Thought content normal.        Judgment: Judgment normal.     Lab Results  Component Value Date   WBC 5.3 03/23/2020   HGB 13.0 03/23/2020   HCT 40.0 03/23/2020   PLT 281 03/23/2020   GLUCOSE 82 03/23/2020   CHOL 258 (H) 03/23/2020   TRIG 121 03/23/2020   HDL 80 03/23/2020   LDLDIRECT 157.5 02/25/2013   LDLCALC 154 (H) 03/23/2020   ALT 16 03/23/2020   AST 24 03/23/2020   NA 140 03/23/2020   K 4.2 03/23/2020   CL 102 03/23/2020   CREATININE 0.82 03/23/2020   BUN 13 03/23/2020   CO2 29 03/23/2020   TSH 2.68 03/23/2020   INR 0.95 02/24/2011    CT Chest W Contrast  Result Date: 05/26/2014 CLINICAL DATA:  HISTORY OF ANAL CANCER, 1 YEAR FOLLOW-UP, EVALUATE FOR METASTATIC DISEASE. HEMORRHOID BANDING. RADIATION AND CHEMOTHERAPY COMPLETED 07/05/2013. EXAM: CT CHEST AND PELVIS WITH CONTRAST TECHNIQUE: Multidetector CT imaging of the chest and pelvis was performed using the standard protocol following the bolus administration of intravenous contrast. CONTRAST:  123mL OMNIPAQUE IOHEXOL 300 MG/ML  SOLN COMPARISON:  CT ABDOMEN 05/12/2014, PET 05/22/2013 AND CT CHEST ABDOMEN PELVIS 05/06/2013. CT CHEST FINDINGS: A sub cm partially calcified nodule is again seen in the left lobe of the thyroid. No pathologically enlarged mediastinal, hilar or axillary lymph nodes. Heart size within normal limits. No pericardial effusion. Lungs are clear.  No pleural fluid.  Airway is unremarkable. Incidental imaging of the upper abdomen shows the visualized portions of the liver, gallbladder, adrenal glands, kidneys, spleen, pancreas, stomach and bowel to be grossly unremarkable. CT PELVIS FINDINGS: Visualized portions of the small bowel, appendix and colon are unremarkable. Uterus and ovaries are visualized. Bladder is unremarkable. Trace atherosclerotic calcification of the arterial vasculature. No pathologically enlarged lymph nodes. No free fluid. Patchy sclerosis is seen throughout the sacrum  and left superior pubic ramus, new. L4-5 posterior lumbar interbody fusion. IMPRESSION: 1. New patchy sclerosis in the left superior pubic ramus and bilateral sacrum may be treatment related. Difficult to definitively exclude metastatic disease. Please correlate clinically. 2. Otherwise, no evidence of metastatic disease in the chest or pelvis. Electronically Signed   By: Lorin Picket M.D.   On: 05/26/2014 15:42   CT Pelvis W Contrast  Result Date: 05/26/2014 CLINICAL DATA:  HISTORY OF ANAL CANCER, 1 YEAR FOLLOW-UP, EVALUATE FOR METASTATIC DISEASE. HEMORRHOID BANDING. RADIATION AND CHEMOTHERAPY COMPLETED 07/05/2013. EXAM: CT CHEST AND PELVIS WITH CONTRAST TECHNIQUE: Multidetector CT imaging of the chest and pelvis was performed using the standard protocol following the bolus administration of intravenous contrast. CONTRAST:  181mL OMNIPAQUE IOHEXOL 300 MG/ML  SOLN  COMPARISON:  CT ABDOMEN 05/12/2014, PET 05/22/2013 AND CT CHEST ABDOMEN PELVIS 05/06/2013. CT CHEST FINDINGS: A sub cm partially calcified nodule is again seen in the left lobe of the thyroid. No pathologically enlarged mediastinal, hilar or axillary lymph nodes. Heart size within normal limits. No pericardial effusion. Lungs are clear.  No pleural fluid.  Airway is unremarkable. Incidental imaging of the upper abdomen shows the visualized portions of the liver, gallbladder, adrenal glands, kidneys, spleen, pancreas, stomach and bowel to be grossly unremarkable. CT PELVIS FINDINGS: Visualized portions of the small bowel, appendix and colon are unremarkable. Uterus and ovaries are visualized. Bladder is unremarkable. Trace atherosclerotic calcification of the arterial vasculature. No pathologically enlarged lymph nodes. No free fluid. Patchy sclerosis is seen throughout the sacrum and left superior pubic ramus, new. L4-5 posterior lumbar interbody fusion. IMPRESSION: 1. New patchy sclerosis in the left superior pubic ramus and bilateral sacrum may  be treatment related. Difficult to definitively exclude metastatic disease. Please correlate clinically. 2. Otherwise, no evidence of metastatic disease in the chest or pelvis. Electronically Signed   By: Lorin Picket M.D.   On: 05/26/2014 15:42    Assessment & Plan:   Edy was seen today for annual exam.  Diagnoses and all orders for this visit:  Gastroesophageal reflux disease without esophagitis- She has no symptoms or complications related to this. -     CBC with Differential/Platelet; Future -     CBC with Differential/Platelet  Stage 3a chronic kidney disease- Her GFR has improved.  Her blood pressure is adequately well controlled.  She agrees to avoid nephrotoxic agents. -     BASIC METABOLIC PANEL WITH GFR; Future -     Urinalysis, Routine w reflex microscopic; Future -     Urinalysis, Routine w reflex microscopic -     BASIC METABOLIC PANEL WITH GFR  Visit for screening mammogram -     MM DIGITAL SCREENING BILATERAL; Future  Hyperlipidemia with target LDL less than 160- Her ASCVD risk score is less than 7%.  I therefore do not recommend a statin for CV risk reduction. -     Lipid panel; Future -     Hepatic function panel; Future -     TSH; Future -     TSH -     Hepatic function panel -     Lipid panel  Anal cancer ([place])- I recommended that she see GI to undergo another endoscopy. -     Ambulatory referral to Gastroenterology  Spinal stenosis, lumbar region without neurogenic claudication -     oxyCODONE-acetaminophen (PERCOCET/ROXICET) 5-325 MG tablet; Take 1 tablet by mouth every 4 (four) hours as needed for severe pain. -     diazepam (VALIUM) 10 MG tablet; Take 1 tablet (10 mg total) by mouth every 12 (twelve) hours as needed.  GAD (generalized anxiety disorder) -     diazepam (VALIUM) 10 MG tablet; Take 1 tablet (10 mg total) by mouth every 12 (twelve) hours as needed.   I have changed Taffany Carignan's diazepam. I am also having her maintain her  VITAMIN D PO and oxyCODONE-acetaminophen.  Meds ordered this encounter  Medications  . oxyCODONE-acetaminophen (PERCOCET/ROXICET) 5-325 MG tablet    Sig: Take 1 tablet by mouth every 4 (four) hours as needed for severe pain.    Dispense:  75 tablet    Refill:  0  . diazepam (VALIUM) 10 MG tablet    Sig: Take 1 tablet (10 mg total) by mouth every 12 (  twelve) hours as needed.    Dispense:  60 tablet    Refill:  2   In addition to time spent on CPE, I spent 50 minutes in preparing to see the patient by review of recent labs, imaging and procedures, obtaining and reviewing separately obtained history, communicating with the patient and family or caregiver, ordering medications, tests or procedures, and documenting clinical information in the EHR including the differential Dx, treatment, and any further evaluation and other management of 1. Gastroesophageal reflux disease without esophagitis 2. Stage 3a chronic kidney disease 3. Hyperlipidemia with target LDL less than 160 4. Anal cancer ([place]) 5. Spinal stenosis, lumbar region without neurogenic claudication 6. GAD (generalized anxiety disorder) .    Follow-up: Return in about 6 months (around 09/23/2020).  Scarlette Calico, MD

## 2020-03-23 NOTE — Patient Instructions (Signed)
Health Maintenance, Female Adopting a healthy lifestyle and getting preventive care are important in promoting health and wellness. Ask your health care provider about:  The right schedule for you to have regular tests and exams.  Things you can do on your own to prevent diseases and keep yourself healthy. What should I know about diet, weight, and exercise? Eat a healthy diet   Eat a diet that includes plenty of vegetables, fruits, low-fat dairy products, and lean protein.  Do not eat a lot of foods that are high in solid fats, added sugars, or sodium. Maintain a healthy weight Body mass index (BMI) is used to identify weight problems. It estimates body fat based on height and weight. Your health care provider can help determine your BMI and help you achieve or maintain a healthy weight. Get regular exercise Get regular exercise. This is one of the most important things you can do for your health. Most adults should:  Exercise for at least 150 minutes each week. The exercise should increase your heart rate and make you sweat (moderate-intensity exercise).  Do strengthening exercises at least twice a week. This is in addition to the moderate-intensity exercise.  Spend less time sitting. Even light physical activity can be beneficial. Watch cholesterol and blood lipids Have your blood tested for lipids and cholesterol at 66 years of age, then have this test every 5 years. Have your cholesterol levels checked more often if:  Your lipid or cholesterol levels are high.  You are older than 66 years of age.  You are at high risk for heart disease. What should I know about cancer screening? Depending on your health history and family history, you may need to have cancer screening at various ages. This may include screening for:  Breast cancer.  Cervical cancer.  Colorectal cancer.  Skin cancer.  Lung cancer. What should I know about heart disease, diabetes, and high blood  pressure? Blood pressure and heart disease  High blood pressure causes heart disease and increases the risk of stroke. This is more likely to develop in people who have high blood pressure readings, are of African descent, or are overweight.  Have your blood pressure checked: ? Every 3-5 years if you are 18-39 years of age. ? Every year if you are 40 years old or older. Diabetes Have regular diabetes screenings. This checks your fasting blood sugar level. Have the screening done:  Once every three years after age 40 if you are at a normal weight and have a low risk for diabetes.  More often and at a younger age if you are overweight or have a high risk for diabetes. What should I know about preventing infection? Hepatitis B If you have a higher risk for hepatitis B, you should be screened for this virus. Talk with your health care provider to find out if you are at risk for hepatitis B infection. Hepatitis C Testing is recommended for:  Everyone born from 1945 through 1965.  Anyone with known risk factors for hepatitis C. Sexually transmitted infections (STIs)  Get screened for STIs, including gonorrhea and chlamydia, if: ? You are sexually active and are younger than 66 years of age. ? You are older than 66 years of age and your health care provider tells you that you are at risk for this type of infection. ? Your sexual activity has changed since you were last screened, and you are at increased risk for chlamydia or gonorrhea. Ask your health care provider if   you are at risk.  Ask your health care provider about whether you are at high risk for HIV. Your health care provider may recommend a prescription medicine to help prevent HIV infection. If you choose to take medicine to prevent HIV, you should first get tested for HIV. You should then be tested every 3 months for as long as you are taking the medicine. Pregnancy  If you are about to stop having your period (premenopausal) and  you may become pregnant, seek counseling before you get pregnant.  Take 400 to 800 micrograms (mcg) of folic acid every day if you become pregnant.  Ask for birth control (contraception) if you want to prevent pregnancy. Osteoporosis and menopause Osteoporosis is a disease in which the bones lose minerals and strength with aging. This can result in bone fractures. If you are 65 years old or older, or if you are at risk for osteoporosis and fractures, ask your health care provider if you should:  Be screened for bone loss.  Take a calcium or vitamin D supplement to lower your risk of fractures.  Be given hormone replacement therapy (HRT) to treat symptoms of menopause. Follow these instructions at home: Lifestyle  Do not use any products that contain nicotine or tobacco, such as cigarettes, e-cigarettes, and chewing tobacco. If you need help quitting, ask your health care provider.  Do not use street drugs.  Do not share needles.  Ask your health care provider for help if you need support or information about quitting drugs. Alcohol use  Do not drink alcohol if: ? Your health care provider tells you not to drink. ? You are pregnant, may be pregnant, or are planning to become pregnant.  If you drink alcohol: ? Limit how much you use to 0-1 drink a day. ? Limit intake if you are breastfeeding.  Be aware of how much alcohol is in your drink. In the [place] one drink equals one 12 oz bottle of beer (355 mL), one 5 oz glass of wine (148 mL), or one 1 oz glass of hard liquor (44 mL). General instructions  Schedule regular health, dental, and eye exams.  Stay current with your vaccines.  Tell your health care provider if: ? You often feel depressed. ? You have ever been abused or do not feel safe at home. Summary  Adopting a healthy lifestyle and getting preventive care are important in promoting health and wellness.  Follow your health care provider's instructions about healthy  diet, exercising, and getting tested or screened for diseases.  Follow your health care provider's instructions on monitoring your cholesterol and blood pressure. This information is not intended to replace advice given to you by your health care provider. Make sure you discuss any questions you have with your health care provider. Document Revised: 07/11/2018 Document Reviewed: 07/11/2018 Elsevier Patient Education  2020 Elsevier Inc.  

## 2020-03-24 LAB — LIPID PANEL
Cholesterol: 258 mg/dL — ABNORMAL HIGH (ref <200)
HDL: 80 mg/dL (ref >=50)
LDL Cholesterol (Calc): 154 mg/dL (calc) — ABNORMAL HIGH
Non-HDL Cholesterol (Calc): 178 mg/dL (calc) — ABNORMAL HIGH (ref <130)
Total CHOL/HDL Ratio: 3.2 (calc) (ref <5.0)
Triglycerides: 121 mg/dL (ref <150)

## 2020-03-24 LAB — URINALYSIS, ROUTINE W REFLEX MICROSCOPIC
Bilirubin Urine: NEGATIVE
Glucose, UA: NEGATIVE
Hgb urine dipstick: NEGATIVE
Ketones, ur: NEGATIVE
Leukocytes,Ua: NEGATIVE
Nitrite: NEGATIVE
Protein, ur: NEGATIVE
Specific Gravity, Urine: 1.004 (ref 1.001–1.03)
pH: 6.5 (ref 5.0–8.0)

## 2020-03-24 LAB — CBC WITH DIFFERENTIAL/PLATELET
Absolute Monocytes: 504 cells/uL (ref 200–950)
Basophils Absolute: 42 cells/uL (ref 0–200)
Basophils Relative: 0.8 %
Eosinophils Absolute: 133 cells/uL (ref 15–500)
Eosinophils Relative: 2.5 %
HCT: 40 % (ref 35.0–45.0)
Hemoglobin: 13 g/dL (ref 11.7–15.5)
Lymphs Abs: 1707 cells/uL (ref 850–3900)
MCH: 28.3 pg (ref 27.0–33.0)
MCHC: 32.5 g/dL (ref 32.0–36.0)
MCV: 87 fL (ref 80.0–100.0)
MPV: 10.2 fL (ref 7.5–12.5)
Monocytes Relative: 9.5 %
Neutro Abs: 2915 cells/uL (ref 1500–7800)
Neutrophils Relative %: 55 %
Platelets: 281 10*3/uL (ref 140–400)
RBC: 4.6 10*6/uL (ref 3.80–5.10)
RDW: 13.1 % (ref 11.0–15.0)
Total Lymphocyte: 32.2 %
WBC: 5.3 10*3/uL (ref 3.8–10.8)

## 2020-03-24 LAB — HEPATIC FUNCTION PANEL
AG Ratio: 1.6 (calc) (ref 1.0–2.5)
ALT: 16 U/L (ref 6–29)
AST: 24 U/L (ref 10–35)
Albumin: 4.3 g/dL (ref 3.6–5.1)
Alkaline phosphatase (APISO): 72 U/L (ref 37–153)
Bilirubin, Direct: 0.1 mg/dL (ref 0.0–0.2)
Globulin: 2.7 g/dL (calc) (ref 1.9–3.7)
Indirect Bilirubin: 0.2 mg/dL (calc) (ref 0.2–1.2)
Total Bilirubin: 0.3 mg/dL (ref 0.2–1.2)
Total Protein: 7 g/dL (ref 6.1–8.1)

## 2020-03-24 LAB — BASIC METABOLIC PANEL WITH GFR
BUN: 13 mg/dL (ref 7–25)
CO2: 29 mmol/L (ref 20–32)
Calcium: 9.8 mg/dL (ref 8.6–10.4)
Chloride: 102 mmol/L (ref 98–110)
Creat: 0.82 mg/dL (ref 0.50–0.99)
GFR, Est African American: 86 mL/min/{1.73_m2} (ref >=60)
GFR, Est Non African American: 75 mL/min/{1.73_m2} (ref >=60)
Glucose, Bld: 82 mg/dL (ref 65–99)
Potassium: 4.2 mmol/L (ref 3.5–5.3)
Sodium: 140 mmol/L (ref 135–146)

## 2020-03-24 LAB — TSH: TSH: 2.68 mIU/L (ref 0.40–4.50)

## 2020-08-10 DIAGNOSIS — H35371 Puckering of macula, right eye: Secondary | ICD-10-CM | POA: Diagnosis not present

## 2020-10-09 ENCOUNTER — Other Ambulatory Visit: Payer: Self-pay | Admitting: Internal Medicine

## 2020-10-09 DIAGNOSIS — M48061 Spinal stenosis, lumbar region without neurogenic claudication: Secondary | ICD-10-CM

## 2020-10-09 DIAGNOSIS — F411 Generalized anxiety disorder: Secondary | ICD-10-CM

## 2020-10-11 ENCOUNTER — Other Ambulatory Visit: Payer: Self-pay | Admitting: Internal Medicine

## 2020-10-11 DIAGNOSIS — F411 Generalized anxiety disorder: Secondary | ICD-10-CM

## 2020-10-11 DIAGNOSIS — M48061 Spinal stenosis, lumbar region without neurogenic claudication: Secondary | ICD-10-CM

## 2020-10-11 MED ORDER — DIAZEPAM 10 MG PO TABS: 10.0000 mg | ORAL_TABLET | Freq: Two times a day (BID) | ORAL | 2 refills | Status: DC | PRN

## 2021-03-01 DIAGNOSIS — Z85828 Personal history of other malignant neoplasm of skin: Secondary | ICD-10-CM | POA: Diagnosis not present

## 2021-03-01 DIAGNOSIS — L821 Other seborrheic keratosis: Secondary | ICD-10-CM | POA: Diagnosis not present

## 2021-03-01 DIAGNOSIS — D485 Neoplasm of uncertain behavior of skin: Secondary | ICD-10-CM | POA: Diagnosis not present

## 2021-03-12 ENCOUNTER — Ambulatory Visit: Payer: Medicare Other | Admitting: Podiatry

## 2021-03-15 ENCOUNTER — Ambulatory Visit (INDEPENDENT_AMBULATORY_CARE_PROVIDER_SITE_OTHER): Payer: Medicare HMO

## 2021-03-15 ENCOUNTER — Ambulatory Visit: Payer: Medicare HMO | Admitting: Podiatry

## 2021-03-15 ENCOUNTER — Encounter: Payer: Self-pay | Admitting: Podiatry

## 2021-03-15 ENCOUNTER — Other Ambulatory Visit: Payer: Self-pay

## 2021-03-15 DIAGNOSIS — M2060 Acquired deformities of toe(s), unspecified, unspecified foot: Secondary | ICD-10-CM

## 2021-03-15 DIAGNOSIS — M21611 Bunion of right foot: Secondary | ICD-10-CM | POA: Diagnosis not present

## 2021-03-15 DIAGNOSIS — N951 Menopausal and female climacteric states: Secondary | ICD-10-CM | POA: Insufficient documentation

## 2021-03-15 DIAGNOSIS — M79672 Pain in left foot: Secondary | ICD-10-CM | POA: Diagnosis not present

## 2021-03-15 DIAGNOSIS — M21612 Bunion of left foot: Secondary | ICD-10-CM | POA: Diagnosis not present

## 2021-03-15 DIAGNOSIS — M79671 Pain in right foot: Secondary | ICD-10-CM

## 2021-03-15 NOTE — Patient Instructions (Signed)
The company that we talked about is called [employer] and the system is called a "[employer]".   If was nice to meet you today. If you have any questions or any further concerns, please feel fee to give me a call. You can call our office at [phone] or please feel fee to send me a message through [place].

## 2021-03-18 NOTE — Progress Notes (Signed)
Subjective:   Patient ID: Emily Compton, female   DOB: 65 y.o.   MRN: [id number]   HPI 65 year old female presents the office with concerns of bilateral bunions.  Left side is worse than the right.  She has throbbing sensation at nighttime.  She likes to be active and she started noticed worsening the bunion as well as discomfort.  She states that she had some mild dropfoot on the right side and she thinks the gait instability is what started the bunions.  She has been try to wear softer shoes to avoid pressure.  She said no recent treatment otherwise.  No other concerns.   Review of Systems  All other systems reviewed and are negative.  Past Medical History:  Diagnosis Date   Cancer ([place]) 04/30/13   anal =poorly diff squamous cell ca    Dental crowns present    and caps   GERD (gastroesophageal reflux disease)    prn OTC   History of radiation therapy 05/27/13-07/05/13   anal canal/50.4Gy/35f   Trigger middle finger of right hand 12/2011    Past Surgical History:  Procedure Laterality Date   FLEXIBLE SIGMOIDOSCOPY  08/29/2012   Procedure: FLEXIBLE SIGMOIDOSCOPY;  Surgeon: RInda Castle MD;  Location: [facility];  Service: Endoscopy;  Laterality: N/A;   HEMORRHOID BANDING  08/29/2012   Procedure: HEMORRHOID BANDING;  Surgeon: RInda Castle MD;  Location: [facility];  Service: Endoscopy;  Laterality: N/A;   LUMBAR FUSION  03/02/2011   right transforaminal lumbar fusion L4-5; left posterolateral fusion L4-5   TON[place]OMY  age 65  [place] 01[place]   Procedure: RELEASE TRIGGER FINGER/A-1 PULLEY;  Surgeon: KTennis Must MD;  [facility] [facility]  Service: Orthopedics;  Laterality: Right;  right long trigger release   TUBAL LIGATION       Current Outpatient Medications:    diazepam (VALIUM) 10 MG tablet, Take 1 tablet (10 mg total) by mouth every 12 (twelve) hours as needed., Disp: 60 tablet, Rfl: 2  Allergies  Allergen Reactions    Adhesive [Tape] Rash         Objective:  Physical Exam  General: AAO x3, NAD  Dermatological: Skin is warm, dry and supple bilateral.  There are no open sores, no preulcerative lesions, no rash or signs of infection present.  Vascular: Dorsalis Pedis artery and Posterior Tibial artery pedal pulses are 2/4 bilateral with immedate capillary fill time. There is no pain with calf compression, swelling, warmth, erythema.   Neruologic: Grossly intact via light touch bilateral.   Musculoskeletal: Mild to moderate bunions are present bilaterally.  There is tenderness palpation directly on the first metatarsal head medially.  There is no pain or crepitation with first MPJ range of motion.  No hypermobility present of the first ray.  Mild discomfort submetatarsal 2.  No other areas of discomfort.  Gait: Unassisted, Nonantalgic.       Assessment:   Bilateral symptomatic bunions     Plan:  -Treatment options discussed including all alternatives, risks, and complications -Etiology of symptoms were discussed -X-rays were obtained and reviewed with the patient.  Moderate bunions are present.  No evidence of acute fracture. -We discussed in detail both conservative as well as surgical treatment options.  Conservatively we discussed bunion pads, shoe modifications, arch supports as well as topical anti-inflammatories as needed.  Surgically discussed bunionectomy.  I discussion she is to continue with conservative treatment for now.  Return if symptoms worsen or fail  to improve.  Trula Slade DPM

## 2021-05-04 ENCOUNTER — Other Ambulatory Visit: Payer: Self-pay

## 2021-05-04 ENCOUNTER — Encounter: Payer: Self-pay | Admitting: Internal Medicine

## 2021-05-04 ENCOUNTER — Telehealth: Payer: Self-pay | Admitting: Internal Medicine

## 2021-05-04 ENCOUNTER — Ambulatory Visit (INDEPENDENT_AMBULATORY_CARE_PROVIDER_SITE_OTHER): Payer: Medicare HMO | Admitting: Internal Medicine

## 2021-05-04 VITALS — BP 116/72 | HR 68 | Temp 98.5°F | Ht 60.0 in | Wt 129.0 lb

## 2021-05-04 DIAGNOSIS — E785 Hyperlipidemia, unspecified: Secondary | ICD-10-CM

## 2021-05-04 DIAGNOSIS — Z0001 Encounter for general adult medical examination with abnormal findings: Secondary | ICD-10-CM

## 2021-05-04 DIAGNOSIS — N1831 Chronic kidney disease, stage 3a: Secondary | ICD-10-CM

## 2021-05-04 DIAGNOSIS — K625 Hemorrhage of anus and rectum: Secondary | ICD-10-CM | POA: Diagnosis not present

## 2021-05-04 DIAGNOSIS — Z1231 Encounter for screening mammogram for malignant neoplasm of breast: Secondary | ICD-10-CM

## 2021-05-04 DIAGNOSIS — C210 Malignant neoplasm of anus, unspecified: Secondary | ICD-10-CM | POA: Diagnosis not present

## 2021-05-04 DIAGNOSIS — M48061 Spinal stenosis, lumbar region without neurogenic claudication: Secondary | ICD-10-CM | POA: Diagnosis not present

## 2021-05-04 DIAGNOSIS — M549 Dorsalgia, unspecified: Secondary | ICD-10-CM

## 2021-05-04 DIAGNOSIS — Z124 Encounter for screening for malignant neoplasm of cervix: Secondary | ICD-10-CM | POA: Insufficient documentation

## 2021-05-04 LAB — LIPID PANEL
Cholesterol: 256 mg/dL — ABNORMAL HIGH (ref 0–200)
HDL: 90.1 mg/dL (ref >39.00)
LDL Cholesterol: 150 mg/dL — ABNORMAL HIGH (ref 0–99)
NonHDL: 165.76
Total CHOL/HDL Ratio: 3
Triglycerides: 79 mg/dL (ref 0.0–149.0)
VLDL: 15.8 mg/dL (ref 0.0–40.0)

## 2021-05-04 LAB — BASIC METABOLIC PANEL
BUN: 14 mg/dL (ref 6–23)
CO2: 29 mEq/L (ref 19–32)
Calcium: 9.3 mg/dL (ref 8.4–10.5)
Chloride: 103 mEq/L (ref 96–112)
Creatinine, Ser: 0.8 mg/dL (ref 0.40–1.20)
GFR: 76.31 mL/min (ref >60.00)
Glucose, Bld: 90 mg/dL (ref 70–99)
Potassium: 4.3 mEq/L (ref 3.5–5.1)
Sodium: 138 mEq/L (ref 135–145)

## 2021-05-04 LAB — CBC WITH DIFFERENTIAL/PLATELET
Basophils Absolute: 0 10*3/uL (ref 0.0–0.1)
Basophils Relative: 0.5 % (ref 0.0–3.0)
Eosinophils Absolute: 0.1 10*3/uL (ref 0.0–0.7)
Eosinophils Relative: 2.2 % (ref 0.0–5.0)
HCT: 39.3 % (ref 36.0–46.0)
Hemoglobin: 13 g/dL (ref 12.0–15.0)
Lymphocytes Relative: 38.3 % (ref 12.0–46.0)
Lymphs Abs: 1.7 10*3/uL (ref 0.7–4.0)
MCHC: 33 g/dL (ref 30.0–36.0)
MCV: 85 fl (ref 78.0–100.0)
Monocytes Absolute: 0.5 10*3/uL (ref 0.1–1.0)
Monocytes Relative: 11 % (ref 3.0–12.0)
Neutro Abs: 2.2 10*3/uL (ref 1.4–7.7)
Neutrophils Relative %: 48 % (ref 43.0–77.0)
Platelets: 269 10*3/uL (ref 150.0–400.0)
RBC: 4.63 Mil/uL (ref 3.87–5.11)
RDW: 14.9 % (ref 11.5–15.5)
WBC: 4.5 10*3/uL (ref 4.0–10.5)

## 2021-05-04 LAB — URINALYSIS, ROUTINE W REFLEX MICROSCOPIC
Bilirubin Urine: NEGATIVE
Hgb urine dipstick: NEGATIVE
Ketones, ur: NEGATIVE
Leukocytes,Ua: NEGATIVE
Nitrite: NEGATIVE
Specific Gravity, Urine: 1.01 (ref 1.000–1.030)
Total Protein, Urine: NEGATIVE
Urine Glucose: NEGATIVE
Urobilinogen, UA: 0.2 (ref 0.0–1.0)
pH: 6 (ref 5.0–8.0)

## 2021-05-04 LAB — TSH: TSH: 2.48 u[IU]/mL (ref 0.35–5.50)

## 2021-05-04 LAB — HEPATIC FUNCTION PANEL
ALT: 17 U/L (ref 0–35)
AST: 26 U/L (ref 0–37)
Albumin: 4.3 g/dL (ref 3.5–5.2)
Alkaline Phosphatase: 65 U/L (ref 39–117)
Bilirubin, Direct: 0 mg/dL (ref 0.0–0.3)
Total Bilirubin: 0.3 mg/dL (ref 0.2–1.2)
Total Protein: 7.1 g/dL (ref 6.0–8.3)

## 2021-05-04 MED ORDER — OXYCODONE-ACETAMINOPHEN 5-325 MG PO TABS: ORAL_TABLET | ORAL | 0 refills | Status: DC

## 2021-05-04 NOTE — Patient Instructions (Signed)
Health Maintenance, Female Adopting a healthy lifestyle and getting preventive care are important in promoting health and wellness. Ask your health care provider about: The right schedule for you to have regular tests and exams. Things you can do on your own to prevent diseases and keep yourself healthy. What should I know about diet, weight, and exercise? Eat a healthy diet  Eat a diet that includes plenty of vegetables, fruits, low-fat dairy products, and lean protein. Do not eat a lot of foods that are high in solid fats, added sugars, or sodium. Maintain a healthy weight Body mass index (BMI) is used to identify weight problems. It estimates body fat based on height and weight. Your health care provider can help determine your BMI and help you achieve or maintain a healthy weight. Get regular exercise Get regular exercise. This is one of the most important things you can do for your health. Most adults should: Exercise for at least 150 minutes each week. The exercise should increase your heart rate and make you sweat (moderate-intensity exercise). Do strengthening exercises at least twice a week. This is in addition to the moderate-intensity exercise. Spend less time sitting. Even light physical activity can be beneficial. Watch cholesterol and blood lipids Have your blood tested for lipids and cholesterol at 67 years of age, then have this test every 5 years. Have your cholesterol levels checked more often if: Your lipid or cholesterol levels are high. You are older than 67 years of age. You are at high risk for heart disease. What should I know about cancer screening? Depending on your health history and family history, you may need to have cancer screening at various ages. This may include screening for: Breast cancer. Cervical cancer. Colorectal cancer. Skin cancer. Lung cancer. What should I know about heart disease, diabetes, and high blood pressure? Blood pressure and heart  disease High blood pressure causes heart disease and increases the risk of stroke. This is more likely to develop in people who have high blood pressure readings, are of African descent, or are overweight. Have your blood pressure checked: Every 3-5 years if you are 18-39 years of age. Every year if you are 40 years old or older. Diabetes Have regular diabetes screenings. This checks your fasting blood sugar level. Have the screening done: Once every three years after age 40 if you are at a normal weight and have a low risk for diabetes. More often and at a younger age if you are overweight or have a high risk for diabetes. What should I know about preventing infection? Hepatitis B If you have a higher risk for hepatitis B, you should be screened for this virus. Talk with your health care provider to find out if you are at risk for hepatitis B infection. Hepatitis C Testing is recommended for: Everyone born from 1945 through 1965. Anyone with known risk factors for hepatitis C. Sexually transmitted infections (STIs) Get screened for STIs, including gonorrhea and chlamydia, if: You are sexually active and are younger than 67 years of age. You are older than 67 years of age and your health care provider tells you that you are at risk for this type of infection. Your sexual activity has changed since you were last screened, and you are at increased risk for chlamydia or gonorrhea. Ask your health care provider if you are at risk. Ask your health care provider about whether you are at high risk for HIV. Your health care provider may recommend a prescription medicine   to help prevent HIV infection. If you choose to take medicine to prevent HIV, you should first get tested for HIV. You should then be tested every 3 months for as long as you are taking the medicine. Pregnancy If you are about to stop having your period (premenopausal) and you may become pregnant, seek counseling before you get  pregnant. Take 400 to 800 micrograms (mcg) of folic acid every day if you become pregnant. Ask for birth control (contraception) if you want to prevent pregnancy. Osteoporosis and menopause Osteoporosis is a disease in which the bones lose minerals and strength with aging. This can result in bone fractures. If you are 65 years old or older, or if you are at risk for osteoporosis and fractures, ask your health care provider if you should: Be screened for bone loss. Take a calcium or vitamin D supplement to lower your risk of fractures. Be given hormone replacement therapy (HRT) to treat symptoms of menopause. Follow these instructions at home: Lifestyle Do not use any products that contain nicotine or tobacco, such as cigarettes, e-cigarettes, and chewing tobacco. If you need help quitting, ask your health care provider. Do not use street drugs. Do not share needles. Ask your health care provider for help if you need support or information about quitting drugs. Alcohol use Do not drink alcohol if: Your health care provider tells you not to drink. You are pregnant, may be pregnant, or are planning to become pregnant. If you drink alcohol: Limit how much you use to 0-1 drink a day. Limit intake if you are breastfeeding. Be aware of how much alcohol is in your drink. In the [place] one drink equals one 12 oz bottle of beer (355 mL), one 5 oz glass of wine (148 mL), or one 1 oz glass of hard liquor (44 mL). General instructions Schedule regular health, dental, and eye exams. Stay current with your vaccines. Tell your health care provider if: You often feel depressed. You have ever been abused or do not feel safe at home. Summary Adopting a healthy lifestyle and getting preventive care are important in promoting health and wellness. Follow your health care provider's instructions about healthy diet, exercising, and getting tested or screened for diseases. Follow your health care provider's  instructions on monitoring your cholesterol and blood pressure. This information is not intended to replace advice given to you by your health care provider. Make sure you discuss any questions you have with your health care provider. Document Revised: 09/25/2020 Document Reviewed: 07/11/2018 Elsevier Patient Education  2022 [employer] Inc.  

## 2021-05-04 NOTE — Progress Notes (Signed)
Subjective:  Patient ID: Emily Compton, female    DOB: 12/03/1955  Age: 65 y.o. MRN: [id number]  CC: Annual Exam  This visit occurred during the SARS-CoV-2 public health emergency.  Safety protocols were in place, including screening questions prior to the visit, additional usage of staff PPE, and extensive cleaning of exam room while observing appropriate contact time as indicated for disinfecting solutions.    HPI [name] presents for a CPX and f/up -   She continues to c/o NR LBP. She occasionally takes a nsaid despite having CRI. She requests a refill of percocet. She has intermittent blood in her bowel movements.   Outpatient Medications Prior to Visit  Medication Sig Dispense Refill   diazepam (VALIUM) 10 MG tablet Take 1 tablet (10 mg total) by mouth every 12 (twelve) hours as needed. 60 tablet 2   Omeprazole Magnesium (PRILOSEC OTC PO) Take by mouth.     VITAMIN D PO Take by mouth.     No facility-administered medications prior to visit.    ROS Review of Systems  Constitutional:  Negative for diaphoresis and fatigue.  HENT: Negative.  Negative for sore throat.   Eyes: Negative.   Respiratory:  Negative for cough, chest tightness, shortness of breath and wheezing.   Cardiovascular:  Negative for chest pain, palpitations and leg swelling.  Gastrointestinal:  Positive for anal bleeding and blood in stool. Negative for abdominal pain, diarrhea, nausea and vomiting.  Musculoskeletal:  Positive for back pain. Negative for arthralgias and myalgias.  Skin: Negative.  Negative for color change and pallor.  Neurological: Negative.  Negative for dizziness, weakness, light-headedness and numbness.  Hematological:  Negative for adenopathy. Does not bruise/bleed easily.  Psychiatric/Behavioral:  Negative for dysphoric mood and suicidal ideas. The patient is nervous/anxious.    Objective:  BP 116/72 (BP Location: Left Arm, Patient Position: Sitting, Cuff Size: Large)    Pulse 68   Temp 98.5 F (36.9 C) (Oral)   Ht 5' (1.524 m)   Wt 129 lb (58.5 kg)   SpO2 98%   BMI 25.19 kg/m   BP Readings from Last 3 Encounters:  05/04/21 116/72  03/23/20 130/80  01/16/19 120/74    Wt Readings from Last 3 Encounters:  05/04/21 129 lb (58.5 kg)  03/23/20 131 lb 8 oz (59.6 kg)  01/16/19 132 lb (59.9 kg)    Physical Exam Vitals reviewed.  HENT:     Nose: Nose normal.     Mouth/Throat:     Mouth: Mucous membranes are moist.  Eyes:     General: No scleral icterus.    Conjunctiva/sclera: Conjunctivae normal.  Cardiovascular:     Rate and Rhythm: Normal rate and regular rhythm.     Heart sounds: No murmur heard. Pulmonary:     Effort: Pulmonary effort is normal.     Breath sounds: No wheezing, rhonchi or rales.  Abdominal:     General: Abdomen is flat.     Palpations: There is no mass.     Tenderness: There is no abdominal tenderness. There is no guarding.     Hernia: No hernia is present.  Musculoskeletal:        General: No tenderness. Normal range of motion.     Cervical back: Neck supple.     Right lower leg: No edema.     Left lower leg: No edema.  Lymphadenopathy:     Cervical: No cervical adenopathy.  Skin:    General: Skin is warm.  Coloration: Skin is not pale.  Neurological:     General: No focal deficit present.     Mental Status: She is alert and oriented to person, place, and time.    Lab Results  Component Value Date   WBC 5.3 03/23/2020   HGB 13.0 03/23/2020   HCT 40.0 03/23/2020   PLT 281 03/23/2020   GLUCOSE 82 03/23/2020   CHOL 258 (H) 03/23/2020   TRIG 121 03/23/2020   HDL 80 03/23/2020   LDLDIRECT 157.5 02/25/2013   LDLCALC 154 (H) 03/23/2020   ALT 16 03/23/2020   AST 24 03/23/2020   NA 140 03/23/2020   K 4.2 03/23/2020   CL 102 03/23/2020   CREATININE 0.82 03/23/2020   BUN 13 03/23/2020   CO2 29 03/23/2020   TSH 2.68 03/23/2020   INR 0.95 02/24/2011    CT Chest W Contrast  Result Date:  05/26/2014 CLINICAL DATA:  HISTORY OF ANAL CANCER, 1 YEAR FOLLOW-UP, EVALUATE FOR METASTATIC DISEASE. HEMORRHOID BANDING. RADIATION AND CHEMOTHERAPY COMPLETED 07/05/2013. EXAM: CT CHEST AND PELVIS WITH CONTRAST TECHNIQUE: Multidetector CT imaging of the chest and pelvis was performed using the standard protocol following the bolus administration of intravenous contrast. CONTRAST:  13mL OMNIPAQUE IOHEXOL 300 MG/ML  SOLN COMPARISON:  CT ABDOMEN 05/12/2014, PET 05/22/2013 AND CT CHEST ABDOMEN PELVIS 05/06/2013. CT CHEST FINDINGS: A sub cm partially calcified nodule is again seen in the left lobe of the thyroid. No pathologically enlarged mediastinal, hilar or axillary lymph nodes. Heart size within normal limits. No pericardial effusion. Lungs are clear.  No pleural fluid.  Airway is unremarkable. Incidental imaging of the upper abdomen shows the visualized portions of the liver, gallbladder, adrenal glands, kidneys, spleen, pancreas, stomach and bowel to be grossly unremarkable. CT PELVIS FINDINGS: Visualized portions of the small bowel, appendix and colon are unremarkable. Uterus and ovaries are visualized. Bladder is unremarkable. Trace atherosclerotic calcification of the arterial vasculature. No pathologically enlarged lymph nodes. No free fluid. Patchy sclerosis is seen throughout the sacrum and left superior pubic ramus, new. L4-5 posterior lumbar interbody fusion. IMPRESSION: 1. New patchy sclerosis in the left superior pubic ramus and bilateral sacrum may be treatment related. Difficult to definitively exclude metastatic disease. Please correlate clinically. 2. Otherwise, no evidence of metastatic disease in the chest or pelvis. Electronically Signed   By: Lorin Picket M.D.   On: 05/26/2014 15:42   CT Pelvis W Contrast  Result Date: 05/26/2014 CLINICAL DATA:  HISTORY OF ANAL CANCER, 1 YEAR FOLLOW-UP, EVALUATE FOR METASTATIC DISEASE. HEMORRHOID BANDING. RADIATION AND CHEMOTHERAPY COMPLETED 07/05/2013.  EXAM: CT CHEST AND PELVIS WITH CONTRAST TECHNIQUE: Multidetector CT imaging of the chest and pelvis was performed using the standard protocol following the bolus administration of intravenous contrast. CONTRAST:  144mL OMNIPAQUE IOHEXOL 300 MG/ML  SOLN COMPARISON:  CT ABDOMEN 05/12/2014, PET 05/22/2013 AND CT CHEST ABDOMEN PELVIS 05/06/2013. CT CHEST FINDINGS: A sub cm partially calcified nodule is again seen in the left lobe of the thyroid. No pathologically enlarged mediastinal, hilar or axillary lymph nodes. Heart size within normal limits. No pericardial effusion. Lungs are clear.  No pleural fluid.  Airway is unremarkable. Incidental imaging of the upper abdomen shows the visualized portions of the liver, gallbladder, adrenal glands, kidneys, spleen, pancreas, stomach and bowel to be grossly unremarkable. CT PELVIS FINDINGS: Visualized portions of the small bowel, appendix and colon are unremarkable. Uterus and ovaries are visualized. Bladder is unremarkable. Trace atherosclerotic calcification of the arterial vasculature. No pathologically enlarged lymph nodes. No free  fluid. Patchy sclerosis is seen throughout the sacrum and left superior pubic ramus, new. L4-5 posterior lumbar interbody fusion. IMPRESSION: 1. New patchy sclerosis in the left superior pubic ramus and bilateral sacrum may be treatment related. Difficult to definitively exclude metastatic disease. Please correlate clinically. 2. Otherwise, no evidence of metastatic disease in the chest or pelvis. Electronically Signed   By: Lorin Picket M.D.   On: 05/26/2014 15:42    Assessment & Plan:   Catlin was seen today for annual exam.  Diagnoses and all orders for this visit:  Stage 3a chronic kidney disease ([place])- Her renal fxn is stable. I have asked her to avoid nsaids. -     CBC with Differential/Platelet; Future -     Basic metabolic panel; Future -     Urinalysis, Routine w reflex microscopic; Future -     Hepatic function  panel; Future -     Hepatic function panel -     Urinalysis, Routine w reflex microscopic -     Basic metabolic panel -     CBC with Differential/Platelet  Hyperlipidemia with target LDL less than 160- Statin tx is not indicated. -     Lipid panel; Future -     TSH; Future -     Hepatic function panel; Future -     Hepatic function panel -     TSH -     Lipid panel  Visit for screening mammogram -     MM DIGITAL SCREENING BILATERAL; Future  Encounter for general adult medical examination with abnormal findings- Exam completed, labs reviewed, vaccines reviewed-she refused vaccines against shingles and influenza, cancer screenings addressed, patient education was given.  Anal cancer ([place]) -     Ambulatory referral to Gastroenterology  Cervical cancer screening -     Ambulatory referral to Gynecology  BRBPR (bright red blood per rectum) -     Ambulatory referral to Gastroenterology  Back pain -     oxyCODONE-acetaminophen (PERCOCET) 5-325 MG tablet; 1-2 tabs po q 6 hours prn pain  I have changed Roxie Fentress's oxyCODONE-acetaminophen. I am also having her maintain her diazepam, VITAMIN D PO, and Omeprazole Magnesium (PRILOSEC OTC PO).  Meds ordered this encounter  Medications   oxyCODONE-acetaminophen (PERCOCET) 5-325 MG tablet    Sig: 1-2 tabs po q 6 hours prn pain    Dispense:  75 tablet    Refill:  0      Follow-up: Return in about 6 months (around 11/02/2021).  Scarlette Calico, MD

## 2021-05-04 NOTE — Telephone Encounter (Signed)
Pt. Is requesting to have her meds sent to [facility] on [address] In [place] [id number] - [place], [place] - [place] Phone:  [phone]  Fax:  [phone]

## 2021-05-05 ENCOUNTER — Telehealth: Payer: Self-pay | Admitting: Internal Medicine

## 2021-05-05 ENCOUNTER — Other Ambulatory Visit: Payer: Self-pay | Admitting: Internal Medicine

## 2021-05-05 DIAGNOSIS — M48061 Spinal stenosis, lumbar region without neurogenic claudication: Secondary | ICD-10-CM

## 2021-05-05 MED ORDER — OXYCODONE-ACETAMINOPHEN 5-325 MG PO TABS: 1.0000 | ORAL_TABLET | Freq: Four times a day (QID) | ORAL | 0 refills | Status: DC | PRN

## 2021-05-05 NOTE — Telephone Encounter (Signed)
   Pharmacy calling to clarify instructions for OxyCodone Please clarify  Sig - Route: Take 1 tablet by mouth every 6 (six) hours as needed for severe pain. 1-2 tabs po q 6 hours prn pain - Oral

## 2021-05-10 ENCOUNTER — Telehealth: Payer: Self-pay

## 2021-05-10 NOTE — Telephone Encounter (Signed)
Emily Compton called stating that the patient has declined the referral to their office at this time.

## 2021-05-25 ENCOUNTER — Telehealth: Payer: Self-pay

## 2021-06-27 ENCOUNTER — Encounter: Payer: Self-pay | Admitting: Internal Medicine

## 2021-06-27 DIAGNOSIS — M48061 Spinal stenosis, lumbar region without neurogenic claudication: Secondary | ICD-10-CM

## 2021-06-27 DIAGNOSIS — F411 Generalized anxiety disorder: Secondary | ICD-10-CM

## 2021-06-28 ENCOUNTER — Other Ambulatory Visit: Payer: Self-pay | Admitting: Internal Medicine

## 2021-06-28 DIAGNOSIS — F411 Generalized anxiety disorder: Secondary | ICD-10-CM

## 2021-06-28 DIAGNOSIS — M48061 Spinal stenosis, lumbar region without neurogenic claudication: Secondary | ICD-10-CM

## 2021-06-28 MED ORDER — DIAZEPAM 10 MG PO TABS: 10.0000 mg | ORAL_TABLET | Freq: Two times a day (BID) | ORAL | 2 refills | Status: DC | PRN

## 2021-08-14 ENCOUNTER — Encounter: Payer: Self-pay | Admitting: Gastroenterology

## 2021-09-17 DIAGNOSIS — H2513 Age-related nuclear cataract, bilateral: Secondary | ICD-10-CM | POA: Diagnosis not present

## 2021-09-17 DIAGNOSIS — H35371 Puckering of macula, right eye: Secondary | ICD-10-CM | POA: Diagnosis not present

## 2022-02-28 ENCOUNTER — Other Ambulatory Visit: Payer: Self-pay | Admitting: Internal Medicine

## 2022-02-28 ENCOUNTER — Encounter: Payer: Self-pay | Admitting: Internal Medicine

## 2022-02-28 DIAGNOSIS — M48061 Spinal stenosis, lumbar region without neurogenic claudication: Secondary | ICD-10-CM

## 2022-02-28 DIAGNOSIS — F411 Generalized anxiety disorder: Secondary | ICD-10-CM

## 2022-03-07 ENCOUNTER — Ambulatory Visit (INDEPENDENT_AMBULATORY_CARE_PROVIDER_SITE_OTHER): Payer: Medicare HMO | Admitting: Internal Medicine

## 2022-03-07 ENCOUNTER — Encounter: Payer: Self-pay | Admitting: Internal Medicine

## 2022-03-07 ENCOUNTER — Ambulatory Visit (INDEPENDENT_AMBULATORY_CARE_PROVIDER_SITE_OTHER): Payer: Medicare HMO

## 2022-03-07 VITALS — BP 116/80 | HR 68 | Temp 98.4°F | Ht 60.0 in | Wt 132.0 lb

## 2022-03-07 DIAGNOSIS — N1831 Chronic kidney disease, stage 3a: Secondary | ICD-10-CM | POA: Diagnosis not present

## 2022-03-07 DIAGNOSIS — R0781 Pleurodynia: Secondary | ICD-10-CM | POA: Insufficient documentation

## 2022-03-07 DIAGNOSIS — J431 Panlobular emphysema: Secondary | ICD-10-CM

## 2022-03-07 DIAGNOSIS — R9431 Abnormal electrocardiogram [ECG] [EKG]: Secondary | ICD-10-CM

## 2022-03-07 DIAGNOSIS — Z23 Encounter for immunization: Secondary | ICD-10-CM

## 2022-03-07 DIAGNOSIS — K219 Gastro-esophageal reflux disease without esophagitis: Secondary | ICD-10-CM

## 2022-03-07 DIAGNOSIS — R0789 Other chest pain: Secondary | ICD-10-CM | POA: Diagnosis not present

## 2022-03-07 DIAGNOSIS — M48061 Spinal stenosis, lumbar region without neurogenic claudication: Secondary | ICD-10-CM | POA: Diagnosis not present

## 2022-03-07 DIAGNOSIS — R69 Illness, unspecified: Secondary | ICD-10-CM | POA: Diagnosis not present

## 2022-03-07 DIAGNOSIS — F411 Generalized anxiety disorder: Secondary | ICD-10-CM

## 2022-03-07 DIAGNOSIS — R079 Chest pain, unspecified: Secondary | ICD-10-CM | POA: Diagnosis not present

## 2022-03-07 LAB — CBC WITH DIFFERENTIAL/PLATELET
Basophils Absolute: 0 10*3/uL (ref 0.0–0.1)
Basophils Relative: 0.4 % (ref 0.0–3.0)
Eosinophils Absolute: 0.1 10*3/uL (ref 0.0–0.7)
Eosinophils Relative: 1.4 % (ref 0.0–5.0)
HCT: 40.5 % (ref 36.0–46.0)
Hemoglobin: 13.2 g/dL (ref 12.0–15.0)
Lymphocytes Relative: 30.1 % (ref 12.0–46.0)
Lymphs Abs: 1.7 10*3/uL (ref 0.7–4.0)
MCHC: 32.6 g/dL (ref 30.0–36.0)
MCV: 85.9 fl (ref 78.0–100.0)
Monocytes Absolute: 0.5 10*3/uL (ref 0.1–1.0)
Monocytes Relative: 9.3 % (ref 3.0–12.0)
Neutro Abs: 3.4 10*3/uL (ref 1.4–7.7)
Neutrophils Relative %: 58.8 % (ref 43.0–77.0)
Platelets: 273 10*3/uL (ref 150.0–400.0)
RBC: 4.71 Mil/uL (ref 3.87–5.11)
RDW: 15 % (ref 11.5–15.5)
WBC: 5.8 10*3/uL (ref 4.0–10.5)

## 2022-03-07 LAB — TROPONIN I (HIGH SENSITIVITY): High Sens Troponin I: <2 ng/L (ref 2–17)

## 2022-03-07 LAB — BRAIN NATRIURETIC PEPTIDE: Pro B Natriuretic peptide (BNP): 35 pg/mL (ref 0.0–100.0)

## 2022-03-07 LAB — D-DIMER, QUANTITATIVE: D-Dimer, Quant: 0.33 mcg/mL FEU (ref <0.50)

## 2022-03-07 NOTE — Progress Notes (Signed)
Subjective:  Patient ID: Emily Compton, female    DOB: 1956-02-03  Age: 66 y.o. MRN: [id number]  CC: Back Pain and Chest Pain   HPI Arly Salminen presents for f/up -  She complains of a several week history of left-sided chest pain that she describes as pleuritic as well as pressure.  The pain is never exertional.  She denies cough, hemoptysis, shortness of breath, edema, diaphoresis, fever, chills, or palpitations.  She has a history of GERD but is not taking a PPI.  Outpatient Medications Prior to Visit  Medication Sig Dispense Refill   VITAMIN D PO Take by mouth.     diazepam (VALIUM) 10 MG tablet Take 1 tablet (10 mg total) by mouth every 12 (twelve) hours as needed. 60 tablet 2   Omeprazole Magnesium (PRILOSEC OTC PO) Take by mouth.     oxyCODONE-acetaminophen (PERCOCET) 5-325 MG tablet Take 1 tablet by mouth every 6 (six) hours as needed for severe pain. 75 tablet 0   No facility-administered medications prior to visit.    ROS Review of Systems  Constitutional:  Negative for appetite change, diaphoresis and fatigue.  HENT: Negative.  Negative for sore throat, trouble swallowing and voice change.   Respiratory:  Negative for cough, chest tightness, shortness of breath and wheezing.   Cardiovascular:  Positive for chest pain. Negative for palpitations and leg swelling.  Gastrointestinal:  Negative for abdominal pain, constipation, diarrhea, nausea and vomiting.  Genitourinary: Negative.  Negative for difficulty urinating.  Musculoskeletal:  Positive for back pain. Negative for myalgias.  Skin: Negative.   Neurological: Negative.  Negative for dizziness and weakness.  Hematological:  Negative for adenopathy. Does not bruise/bleed easily.  Psychiatric/Behavioral:  Negative for confusion, decreased concentration, dysphoric mood and sleep disturbance. The patient is nervous/anxious.     Objective:  BP 116/80 (BP Location: Left Arm, Patient Position: Sitting, Cuff  Size: Large)   Pulse 68   Temp 98.4 F (36.9 C) (Oral)   Ht 5' (1.524 m)   Wt 132 lb (59.9 kg)   SpO2 98%   BMI 25.78 kg/m   BP Readings from Last 3 Encounters:  03/07/22 116/80  05/04/21 116/72  03/23/20 130/80    Wt Readings from Last 3 Encounters:  03/07/22 132 lb (59.9 kg)  05/04/21 129 lb (58.5 kg)  03/23/20 131 lb 8 oz (59.6 kg)    Physical Exam Vitals reviewed.  HENT:     Mouth/Throat:     Mouth: Mucous membranes are moist.  Eyes:     General: No scleral icterus.    Conjunctiva/sclera: Conjunctivae normal.  Cardiovascular:     Rate and Rhythm: Regular rhythm. Bradycardia present.     Heart sounds: Normal heart sounds, S1 normal and S2 normal. No murmur heard.    No friction rub. No gallop.     Comments: EKG- Sinus bradycardia Negative precordial leads with T wave changes is old No Q waves Pulmonary:     Effort: Pulmonary effort is normal.     Breath sounds: No stridor. No wheezing, rhonchi or rales.  Chest:     Chest wall: No mass, deformity or swelling.  Abdominal:     General: Abdomen is flat.     Palpations: There is no mass.     Tenderness: There is no abdominal tenderness. There is no guarding.     Hernia: No hernia is present.  Musculoskeletal:        General: Normal range of motion.     Cervical back:  Neck supple.     Right lower leg: No edema.     Left lower leg: No edema.  Lymphadenopathy:     Cervical: No cervical adenopathy.  Skin:    General: Skin is warm and dry.  Neurological:     General: No focal deficit present.     Mental Status: She is alert. Mental status is at baseline.     Lab Results  Component Value Date   WBC 5.8 03/07/2022   HGB 13.2 03/07/2022   HCT 40.5 03/07/2022   PLT 273.0 03/07/2022   GLUCOSE 98 03/07/2022   CHOL 256 (H) 05/04/2021   TRIG 79.0 05/04/2021   HDL 90.10 05/04/2021   LDLDIRECT 157.5 02/25/2013   LDLCALC 150 (H) 05/04/2021   ALT 17 05/04/2021   AST 26 05/04/2021   NA 138 03/07/2022   K 3.5  03/07/2022   CL 102 03/07/2022   CREATININE 0.88 03/07/2022   BUN 12 03/07/2022   CO2 26 03/07/2022   TSH 2.48 05/04/2021   INR 0.95 02/24/2011    CT Pelvis W Contrast  Result Date: 05/26/2014 CLINICAL DATA:  HISTORY OF ANAL CANCER, 1 YEAR FOLLOW-UP, EVALUATE FOR METASTATIC DISEASE. HEMORRHOID BANDING. RADIATION AND CHEMOTHERAPY COMPLETED 07/05/2013. EXAM: CT CHEST AND PELVIS WITH CONTRAST TECHNIQUE: Multidetector CT imaging of the chest and pelvis was performed using the standard protocol following the bolus administration of intravenous contrast. CONTRAST:  180m OMNIPAQUE IOHEXOL 300 MG/ML  SOLN COMPARISON:  CT ABDOMEN 05/12/2014, PET 05/22/2013 AND CT CHEST ABDOMEN PELVIS 05/06/2013. CT CHEST FINDINGS: A sub cm partially calcified nodule is again seen in the left lobe of the thyroid. No pathologically enlarged mediastinal, hilar or axillary lymph nodes. Heart size within normal limits. No pericardial effusion. Lungs are clear.  No pleural fluid.  Airway is unremarkable. Incidental imaging of the upper abdomen shows the visualized portions of the liver, gallbladder, adrenal glands, kidneys, spleen, pancreas, stomach and bowel to be grossly unremarkable. CT PELVIS FINDINGS: Visualized portions of the small bowel, appendix and colon are unremarkable. Uterus and ovaries are visualized. Bladder is unremarkable. Trace atherosclerotic calcification of the arterial vasculature. No pathologically enlarged lymph nodes. No free fluid. Patchy sclerosis is seen throughout the sacrum and left superior pubic ramus, new. L4-5 posterior lumbar interbody fusion. IMPRESSION: 1. New patchy sclerosis in the left superior pubic ramus and bilateral sacrum may be treatment related. Difficult to definitively exclude metastatic disease. Please correlate clinically. 2. Otherwise, no evidence of metastatic disease in the chest or pelvis. Electronically Signed   By: MLorin PicketM.D.   On: 05/26/2014 15:42   CT Chest W  Contrast  Result Date: 05/26/2014 CLINICAL DATA:  HISTORY OF ANAL CANCER, 1 YEAR FOLLOW-UP, EVALUATE FOR METASTATIC DISEASE. HEMORRHOID BANDING. RADIATION AND CHEMOTHERAPY COMPLETED 07/05/2013. EXAM: CT CHEST AND PELVIS WITH CONTRAST TECHNIQUE: Multidetector CT imaging of the chest and pelvis was performed using the standard protocol following the bolus administration of intravenous contrast. CONTRAST:  1012mOMNIPAQUE IOHEXOL 300 MG/ML  SOLN COMPARISON:  CT ABDOMEN 05/12/2014, PET 05/22/2013 AND CT CHEST ABDOMEN PELVIS 05/06/2013. CT CHEST FINDINGS: A sub cm partially calcified nodule is again seen in the left lobe of the thyroid. No pathologically enlarged mediastinal, hilar or axillary lymph nodes. Heart size within normal limits. No pericardial effusion. Lungs are clear.  No pleural fluid.  Airway is unremarkable. Incidental imaging of the upper abdomen shows the visualized portions of the liver, gallbladder, adrenal glands, kidneys, spleen, pancreas, stomach and bowel to be grossly unremarkable. CT  PELVIS FINDINGS: Visualized portions of the small bowel, appendix and colon are unremarkable. Uterus and ovaries are visualized. Bladder is unremarkable. Trace atherosclerotic calcification of the arterial vasculature. No pathologically enlarged lymph nodes. No free fluid. Patchy sclerosis is seen throughout the sacrum and left superior pubic ramus, new. L4-5 posterior lumbar interbody fusion. IMPRESSION: 1. New patchy sclerosis in the left superior pubic ramus and bilateral sacrum may be treatment related. Difficult to definitively exclude metastatic disease. Please correlate clinically. 2. Otherwise, no evidence of metastatic disease in the chest or pelvis. Electronically Signed   By: Lorin Picket M.D.   On: 05/26/2014 15:42   CT CARDIAC SCORING ([facility] LOCATIONS ONLY)  Result Date: 03/09/2022 CLINICAL DATA:  66 year old white female * Tracking Code: [place] * EXAM: CT CARDIAC CORONARY ARTERY CALCIUM SCORE  TECHNIQUE: Non-contrast imaging through the heart was performed using prospective ECG gating. Image post processing was performed on an independent workstation, allowing for quantitative analysis of the heart and coronary arteries. Note that this exam targets the heart and the chest was not imaged in its entirety. COMPARISON:  None Available. FINDINGS: CORONARY CALCIUM SCORES: Left Main: 0 LAD: 0 LCx: 0 RCA: 0 Total Agatston Score: 0 MESA database percentile: 0 AORTA MEASUREMENTS: Ascending Aorta: 3.5 cm Descending Aorta: 2.5 cm OTHER FINDINGS: Vascular: Normal heart size. No pericardial effusion. Normal caliber thoracic aorta with no atherosclerotic disease. Mediastinum/Nodes: Esophagus is unremarkable. No pathologically enlarged lymph nodes seen in the chest. Lungs/Pleura: Lungs are clear. No pleural effusion or pneumothorax. Upper Abdomen: No acute abnormality. Musculoskeletal: No chest wall mass or suspicious bone lesions identified. IMPRESSION: Total calcium score of 0. Electronically Signed   By: Yetta Glassman M.D.   On: 03/09/2022 15:44   DG Chest 2 View  Result Date: 03/07/2022 CLINICAL DATA:  left chest pain EXAM: CHEST - 2 VIEW COMPARISON:  Chest x-ray January 02, 2012. FINDINGS: Hyperinflation. No consolidation. No visible pleural effusions or pneumothorax. Cardiomediastinal silhouette is unchanged. No acute osseous abnormality. IMPRESSION: Chronic hyperinflation, compatible with emphysema. No acute findings. Electronically Signed   By: Margaretha Sheffield M.D.   On: 03/07/2022 15:59     Assessment & Plan:   Hser was seen today for back pain and chest pain.  Diagnoses and all orders for this visit:  Pleuritic chest pain- Thorough work-up only reveals age-related emphysema.   -     Troponin I (High Sensitivity); Future -     Brain natriuretic peptide; Future -     D-dimer, quantitative; Future -     DG Chest 2 View; Future -     EKG 12-Lead -     D-dimer, quantitative -     Brain  natriuretic peptide -     Troponin I (High Sensitivity) -     Cancel: CT CARDIAC SCORING (SELF PAY ONLY); Future -     CT CARDIAC SCORING ([facility] LOCATIONS ONLY); Future  Stage 3a chronic kidney disease ([place])- She is avoiding nephrotoxic agents. -     CBC with Differential/Platelet; Future -     Basic metabolic panel; Future -     Urinalysis, Routine w reflex microscopic; Future -     Magnesium; Future -     Magnesium -     Urinalysis, Routine w reflex microscopic -     Basic metabolic panel -     CBC with Differential/Platelet  Gastroesophageal reflux disease without esophagitis- Will restart the PPI since this could be contributing to her symptoms. -     CBC  with Differential/Platelet; Future -     Magnesium; Future -     Magnesium -     CBC with Differential/Platelet -     omeprazole (PRILOSEC) 40 MG capsule; Take 1 capsule (40 mg total) by mouth daily.  Panlobular emphysema ([place])- This is age-related and is asymptomatic. -     Cancel: CT CARDIAC SCORING (SELF PAY ONLY); Future -     CT CARDIAC SCORING ([facility] LOCATIONS ONLY); Future  Need for vaccination -     Pneumococcal polysaccharide vaccine 23-valent greater than or equal to 2yo subcutaneous/IM  Chest pressure- The work-up is negative for cardiopulmonary disease. -     Cancel: CT CARDIAC SCORING (SELF PAY ONLY); Future -     CT CARDIAC SCORING ([facility] LOCATIONS ONLY); Future  Abnormal electrocardiogram- Her coronary calcium score is reassuring.  I do not think her pain is cardiac in nature. -     Cancel: CT CARDIAC SCORING (SELF PAY ONLY); Future -     CT CARDIAC SCORING ([facility] LOCATIONS ONLY); Future  Spinal stenosis, lumbar region without neurogenic claudication -     oxyCODONE-acetaminophen (PERCOCET) 5-325 MG tablet; Take 1 tablet by mouth every 6 (six) hours as needed for severe pain.  GAD (generalized anxiety disorder) -     diazepam (VALIUM) 10 MG tablet; Take 1 tablet (10 mg total) by mouth every 12 (twelve) hours as  needed.   I have discontinued Ashli Menefee's Omeprazole Magnesium (PRILOSEC OTC PO). I am also having her start on omeprazole. Additionally, I am having her maintain her VITAMIN D PO, diazepam, and oxyCODONE-acetaminophen.  Meds ordered this encounter  Medications   diazepam (VALIUM) 10 MG tablet    Sig: Take 1 tablet (10 mg total) by mouth every 12 (twelve) hours as needed.    Dispense:  60 tablet    Refill:  2   oxyCODONE-acetaminophen (PERCOCET) 5-325 MG tablet    Sig: Take 1 tablet by mouth every 6 (six) hours as needed for severe pain.    Dispense:  75 tablet    Refill:  0   omeprazole (PRILOSEC) 40 MG capsule    Sig: Take 1 capsule (40 mg total) by mouth daily.    Dispense:  90 capsule    Refill:  1   I spent 50 minutes in preparing to see the patient by review of recent labs, obtaining and reviewing separately obtained history, communicating with the patient, ordering medications, tests or procedures, and documenting clinical information in the EHR including the differential Dx, treatment, and any further evaluation and management of multiple complex medical issues.     Follow-up: No follow-ups on file.  Scarlette Calico, MD

## 2022-03-08 ENCOUNTER — Encounter: Payer: Self-pay | Admitting: Internal Medicine

## 2022-03-08 DIAGNOSIS — R0789 Other chest pain: Secondary | ICD-10-CM | POA: Insufficient documentation

## 2022-03-08 LAB — URINALYSIS, ROUTINE W REFLEX MICROSCOPIC
Bilirubin Urine: NEGATIVE
Hgb urine dipstick: NEGATIVE
Ketones, ur: NEGATIVE
Leukocytes,Ua: NEGATIVE
Nitrite: NEGATIVE
RBC / HPF: NONE SEEN (ref 0–?)
Specific Gravity, Urine: <=1.005 — AB (ref 1.000–1.030)
Total Protein, Urine: NEGATIVE
Urine Glucose: NEGATIVE
Urobilinogen, UA: 0.2 (ref 0.0–1.0)
WBC, UA: NONE SEEN (ref 0–?)
pH: 6 (ref 5.0–8.0)

## 2022-03-08 LAB — BASIC METABOLIC PANEL
BUN: 12 mg/dL (ref 6–23)
CO2: 26 mEq/L (ref 19–32)
Calcium: 9.3 mg/dL (ref 8.4–10.5)
Chloride: 102 mEq/L (ref 96–112)
Creatinine, Ser: 0.88 mg/dL (ref 0.40–1.20)
GFR: 67.67 mL/min (ref >60.00)
Glucose, Bld: 98 mg/dL (ref 70–99)
Potassium: 3.5 mEq/L (ref 3.5–5.1)
Sodium: 138 mEq/L (ref 135–145)

## 2022-03-08 LAB — MAGNESIUM: Magnesium: 2.2 mg/dL (ref 1.5–2.5)

## 2022-03-08 MED ORDER — OXYCODONE-ACETAMINOPHEN 5-325 MG PO TABS: 1.0000 | ORAL_TABLET | Freq: Four times a day (QID) | ORAL | 0 refills | Status: DC | PRN

## 2022-03-08 MED ORDER — OMEPRAZOLE 40 MG PO CPDR: 40.0000 mg | DELAYED_RELEASE_CAPSULE | Freq: Every day | ORAL | 1 refills | Status: DC

## 2022-03-08 MED ORDER — DIAZEPAM 10 MG PO TABS: 10.0000 mg | ORAL_TABLET | Freq: Two times a day (BID) | ORAL | 2 refills | Status: DC | PRN

## 2022-03-09 ENCOUNTER — Ambulatory Visit
Admission: RE | Admit: 2022-03-09 | Discharge: 2022-03-09 | Disposition: A | Discharge status: Home / Self Care | Payer: Medicare HMO | Source: Ambulatory Visit | Attending: Internal Medicine | Admitting: Internal Medicine

## 2022-03-09 DIAGNOSIS — R0781 Pleurodynia: Secondary | ICD-10-CM

## 2022-03-09 DIAGNOSIS — R9431 Abnormal electrocardiogram [ECG] [EKG]: Secondary | ICD-10-CM

## 2022-03-09 DIAGNOSIS — R0789 Other chest pain: Secondary | ICD-10-CM

## 2022-03-09 DIAGNOSIS — J431 Panlobular emphysema: Secondary | ICD-10-CM

## 2022-03-09 DIAGNOSIS — Z0389 Encounter for observation for other suspected diseases and conditions ruled out: Secondary | ICD-10-CM | POA: Diagnosis not present

## 2022-03-15 ENCOUNTER — Telehealth: Payer: Self-pay

## 2022-03-15 DIAGNOSIS — L820 Inflamed seborrheic keratosis: Secondary | ICD-10-CM | POA: Diagnosis not present

## 2022-03-15 DIAGNOSIS — L821 Other seborrheic keratosis: Secondary | ICD-10-CM | POA: Diagnosis not present

## 2022-03-15 DIAGNOSIS — L814 Other melanin hyperpigmentation: Secondary | ICD-10-CM | POA: Diagnosis not present

## 2022-03-15 DIAGNOSIS — Z85828 Personal history of other malignant neoplasm of skin: Secondary | ICD-10-CM | POA: Diagnosis not present

## 2022-03-15 DIAGNOSIS — L570 Actinic keratosis: Secondary | ICD-10-CM | POA: Diagnosis not present

## 2022-03-15 NOTE — Telephone Encounter (Signed)
Key: [id number]

## 2022-03-15 NOTE — Telephone Encounter (Signed)
Approved 08/01/2021 - 04/14/2022

## 2022-03-16 ENCOUNTER — Encounter: Payer: Self-pay | Admitting: Internal Medicine

## 2022-03-22 DIAGNOSIS — H2513 Age-related nuclear cataract, bilateral: Secondary | ICD-10-CM | POA: Diagnosis not present

## 2022-04-27 ENCOUNTER — Telehealth: Payer: Self-pay | Admitting: Internal Medicine

## 2022-04-27 NOTE — Telephone Encounter (Signed)
Left message for patient to call back and schedule Medicare Annual Wellness Visit (AWV).   Please offer to do virtually or by telephone.  Left office number and my jabber [phone].  AWVI eligible as of 12/31/2019  Please schedule at anytime with Nurse Health Advisor.

## 2022-05-28 ENCOUNTER — Other Ambulatory Visit: Payer: Self-pay | Admitting: Internal Medicine

## 2022-05-28 DIAGNOSIS — N8189 Other female genital prolapse: Secondary | ICD-10-CM | POA: Insufficient documentation

## 2022-07-08 ENCOUNTER — Encounter: Payer: Medicare HMO | Admitting: Obstetrics and Gynecology

## 2022-07-28 DIAGNOSIS — L570 Actinic keratosis: Secondary | ICD-10-CM | POA: Diagnosis not present

## 2022-07-28 DIAGNOSIS — D485 Neoplasm of uncertain behavior of skin: Secondary | ICD-10-CM | POA: Diagnosis not present

## 2022-08-05 ENCOUNTER — Encounter: Payer: Self-pay | Admitting: Internal Medicine

## 2022-09-08 ENCOUNTER — Other Ambulatory Visit: Payer: Self-pay | Admitting: Internal Medicine

## 2022-09-08 DIAGNOSIS — F411 Generalized anxiety disorder: Secondary | ICD-10-CM

## 2022-10-04 DIAGNOSIS — H00012 Hordeolum externum right lower eyelid: Secondary | ICD-10-CM | POA: Diagnosis not present

## 2022-10-20 ENCOUNTER — Encounter: Payer: Self-pay | Admitting: Internal Medicine

## 2022-10-20 ENCOUNTER — Ambulatory Visit (INDEPENDENT_AMBULATORY_CARE_PROVIDER_SITE_OTHER): Payer: Medicare HMO | Admitting: Internal Medicine

## 2022-10-20 VITALS — BP 114/64 | HR 60 | Temp 98.0°F | Ht 60.0 in | Wt 130.0 lb

## 2022-10-20 DIAGNOSIS — H6990 Unspecified Eustachian tube disorder, unspecified ear: Secondary | ICD-10-CM | POA: Insufficient documentation

## 2022-10-20 DIAGNOSIS — R69 Illness, unspecified: Secondary | ICD-10-CM | POA: Diagnosis not present

## 2022-10-20 DIAGNOSIS — F411 Generalized anxiety disorder: Secondary | ICD-10-CM | POA: Diagnosis not present

## 2022-10-20 DIAGNOSIS — J301 Allergic rhinitis due to pollen: Secondary | ICD-10-CM

## 2022-10-20 MED ORDER — DIAZEPAM 10 MG PO TABS: 10.0000 mg | ORAL_TABLET | Freq: Two times a day (BID) | ORAL | 3 refills | Status: DC | PRN

## 2022-10-20 MED ORDER — LEVOCETIRIZINE DIHYDROCHLORIDE 5 MG PO TABS: 5.0000 mg | ORAL_TABLET | Freq: Every evening | ORAL | 1 refills | Status: DC

## 2022-10-20 MED ORDER — METHYLPREDNISOLONE 4 MG PO TBPK: ORAL_TABLET | ORAL | 0 refills | Status: AC

## 2022-10-20 NOTE — Progress Notes (Signed)
Subjective:  Patient ID: Emily Compton, female    DOB: 12/17/1955  Age: 67 y.o. MRN: [id number]  CC: Allergic Rhinitis    HPI Emily Compton presents for f/up ----  She complains of a several week history of nasal congestion, runny nose, sneezing, and pain and popping in both ears.  Outpatient Medications Prior to Visit  Medication Sig Dispense Refill   omeprazole (PRILOSEC) 40 MG capsule Take 1 capsule (40 mg total) by mouth daily. 90 capsule 1   oxyCODONE-acetaminophen (PERCOCET) 5-325 MG tablet Take 1 tablet by mouth every 6 (six) hours as needed for severe pain. 75 tablet 0   VITAMIN D PO Take by mouth.     diazepam (VALIUM) 10 MG tablet TAKE 1 TABLET BY MOUTH EVERY 12 HOURS AS NEEDED 60 tablet 1   No facility-administered medications prior to visit.    ROS Review of Systems  Constitutional: Negative.  Negative for chills, fatigue and fever.  HENT:  Positive for congestion, ear pain, postnasal drip and rhinorrhea. Negative for hearing loss, nosebleeds, sneezing, sore throat, tinnitus and voice change.   Eyes: Negative.   Respiratory:  Negative for cough and wheezing.   Cardiovascular:  Negative for chest pain, palpitations and leg swelling.  Gastrointestinal: Negative.  Negative for abdominal pain and nausea.  Endocrine: Negative.   Genitourinary:  Negative for difficulty urinating.  Musculoskeletal: Negative.   Skin: Negative.   Neurological: Negative.  Negative for dizziness, weakness and headaches.  Psychiatric/Behavioral:  Negative for confusion, decreased concentration, dysphoric mood and sleep disturbance. The patient is nervous/anxious.     Objective:  BP 114/64 (BP Location: Right Arm, Patient Position: Sitting, Cuff Size: Large)   Pulse 60   Temp 98 F (36.7 C) (Oral)   Ht 5' (1.524 m)   Wt 130 lb (59 kg)   SpO2 97%   BMI 25.39 kg/m   BP Readings from Last 3 Encounters:  10/20/22 114/64  03/07/22 116/80  05/04/21 116/72    Wt Readings from  Last 3 Encounters:  10/20/22 130 lb (59 kg)  03/07/22 132 lb (59.9 kg)  05/04/21 129 lb (58.5 kg)    Physical Exam Vitals reviewed.  Constitutional:      Appearance: She is not ill-appearing.  HENT:     Right Ear: Hearing, tympanic membrane, ear canal and external ear normal.     Left Ear: Hearing, tympanic membrane, ear canal and external ear normal.     Nose: Congestion present. No rhinorrhea.  Eyes:     General: No scleral icterus.    Conjunctiva/sclera: Conjunctivae normal.  Cardiovascular:     Rate and Rhythm: Normal rate and regular rhythm.     Heart sounds: No murmur heard. Pulmonary:     Effort: Pulmonary effort is normal.     Breath sounds: No stridor. No wheezing, rhonchi or rales.  Abdominal:     General: Abdomen is flat.     Palpations: There is no mass.     Tenderness: There is no abdominal tenderness. There is no guarding.     Hernia: No hernia is present.  Musculoskeletal:        General: Normal range of motion.     Cervical back: Neck supple.  Lymphadenopathy:     Cervical: No cervical adenopathy.  Skin:    General: Skin is warm and dry.  Neurological:     General: No focal deficit present.     Mental Status: She is alert.     Lab Results  Component  Value Date   WBC 5.8 03/07/2022   HGB 13.2 03/07/2022   HCT 40.5 03/07/2022   PLT 273.0 03/07/2022   GLUCOSE 98 03/07/2022   CHOL 256 (H) 05/04/2021   TRIG 79.0 05/04/2021   HDL 90.10 05/04/2021   LDLDIRECT 157.5 02/25/2013   LDLCALC 150 (H) 05/04/2021   ALT 17 05/04/2021   AST 26 05/04/2021   NA 138 03/07/2022   K 3.5 03/07/2022   CL 102 03/07/2022   CREATININE 0.88 03/07/2022   BUN 12 03/07/2022   CO2 26 03/07/2022   TSH 2.48 05/04/2021   INR 0.95 02/24/2011    CT CARDIAC SCORING ([facility] LOCATIONS ONLY)  Result Date: 03/09/2022 CLINICAL DATA:  67 year old white female * Tracking Code: [place] * EXAM: CT CARDIAC CORONARY ARTERY CALCIUM SCORE TECHNIQUE: Non-contrast imaging through the heart was  performed using prospective ECG gating. Image post processing was performed on an independent workstation, allowing for quantitative analysis of the heart and coronary arteries. Note that this exam targets the heart and the chest was not imaged in its entirety. COMPARISON:  None Available. FINDINGS: CORONARY CALCIUM SCORES: Left Main: 0 LAD: 0 LCx: 0 RCA: 0 Total Agatston Score: 0 MESA database percentile: 0 AORTA MEASUREMENTS: Ascending Aorta: 3.5 cm Descending Aorta: 2.5 cm OTHER FINDINGS: Vascular: Normal heart size. No pericardial effusion. Normal caliber thoracic aorta with no atherosclerotic disease. Mediastinum/Nodes: Esophagus is unremarkable. No pathologically enlarged lymph nodes seen in the chest. Lungs/Pleura: Lungs are clear. No pleural effusion or pneumothorax. Upper Abdomen: No acute abnormality. Musculoskeletal: No chest wall mass or suspicious bone lesions identified. IMPRESSION: Total calcium score of 0. Electronically Signed   By: Yetta Glassman M.D.   On: 03/09/2022 15:44    Assessment & Plan:  Seasonal allergic rhinitis due to pollen -     Levocetirizine Dihydrochloride; Take 1 tablet (5 mg total) by mouth every evening.  Dispense: 90 tablet; Refill: 1 -     methylPREDNISolone; TAKE AS DIRECTED  Dispense: 21 tablet; Refill: 0  Eustachian tube dysfunction, unspecified laterality -     Levocetirizine Dihydrochloride; Take 1 tablet (5 mg total) by mouth every evening.  Dispense: 90 tablet; Refill: 1 -     methylPREDNISolone; TAKE AS DIRECTED  Dispense: 21 tablet; Refill: 0  GAD (generalized anxiety disorder) -     diazePAM; Take 1 tablet (10 mg total) by mouth every 12 (twelve) hours as needed.  Dispense: 60 tablet; Refill: 3     Follow-up: Return in about 4 months (around 02/19/2023).  Scarlette Calico, MD

## 2022-11-07 ENCOUNTER — Ambulatory Visit: Payer: Medicare HMO | Admitting: Internal Medicine

## 2022-11-10 ENCOUNTER — Other Ambulatory Visit: Payer: Self-pay | Admitting: Internal Medicine

## 2022-11-10 DIAGNOSIS — M48061 Spinal stenosis, lumbar region without neurogenic claudication: Secondary | ICD-10-CM

## 2022-11-11 MED ORDER — OXYCODONE-ACETAMINOPHEN 5-325 MG PO TABS: 1.0000 | ORAL_TABLET | Freq: Four times a day (QID) | ORAL | 0 refills | Status: DC | PRN

## 2022-12-29 ENCOUNTER — Encounter (HOSPITAL_BASED_OUTPATIENT_CLINIC_OR_DEPARTMENT_OTHER): Payer: Self-pay | Admitting: Emergency Medicine

## 2022-12-29 ENCOUNTER — Emergency Department (HOSPITAL_BASED_OUTPATIENT_CLINIC_OR_DEPARTMENT_OTHER): Payer: Medicare HMO

## 2022-12-29 ENCOUNTER — Emergency Department (HOSPITAL_BASED_OUTPATIENT_CLINIC_OR_DEPARTMENT_OTHER)
Admission: EM | Admit: 2022-12-29 | Discharge: 2022-12-29 | Disposition: A | Discharge status: Home / Self Care | Payer: Medicare HMO | Attending: Emergency Medicine | Admitting: Emergency Medicine

## 2022-12-29 ENCOUNTER — Other Ambulatory Visit: Payer: Self-pay

## 2022-12-29 DIAGNOSIS — R1084 Generalized abdominal pain: Secondary | ICD-10-CM | POA: Diagnosis not present

## 2022-12-29 DIAGNOSIS — K625 Hemorrhage of anus and rectum: Secondary | ICD-10-CM | POA: Insufficient documentation

## 2022-12-29 DIAGNOSIS — Z85048 Personal history of other malignant neoplasm of rectum, rectosigmoid junction, and anus: Secondary | ICD-10-CM | POA: Insufficient documentation

## 2022-12-29 DIAGNOSIS — K529 Noninfective gastroenteritis and colitis, unspecified: Secondary | ICD-10-CM | POA: Diagnosis not present

## 2022-12-29 LAB — OCCULT BLOOD X 1 CARD TO LAB, STOOL: Fecal Occult Bld: POSITIVE — AB

## 2022-12-29 LAB — COMPREHENSIVE METABOLIC PANEL
ALT: 16 U/L (ref 0–44)
AST: 23 U/L (ref 15–41)
Albumin: 4.3 g/dL (ref 3.5–5.0)
Alkaline Phosphatase: 61 U/L (ref 38–126)
Anion gap: 9 (ref 5–15)
BUN: 16 mg/dL (ref 8–23)
CO2: 26 mmol/L (ref 22–32)
Calcium: 9.5 mg/dL (ref 8.9–10.3)
Chloride: 103 mmol/L (ref 98–111)
Creatinine, Ser: 0.75 mg/dL (ref 0.44–1.00)
GFR, Estimated: >60 mL/min (ref >60)
Glucose, Bld: 108 mg/dL — ABNORMAL HIGH (ref 70–99)
Potassium: 3.9 mmol/L (ref 3.5–5.1)
Sodium: 138 mmol/L (ref 135–145)
Total Bilirubin: 0.5 mg/dL (ref 0.3–1.2)
Total Protein: 7.3 g/dL (ref 6.5–8.1)

## 2022-12-29 LAB — CBC
HCT: 40.1 % (ref 36.0–46.0)
Hemoglobin: 13.3 g/dL (ref 12.0–15.0)
MCH: 28.4 pg (ref 26.0–34.0)
MCHC: 33.2 g/dL (ref 30.0–36.0)
MCV: 85.5 fL (ref 80.0–100.0)
Platelets: 275 10*3/uL (ref 150–400)
RBC: 4.69 MIL/uL (ref 3.87–5.11)
RDW: 14.1 % (ref 11.5–15.5)
WBC: 9.2 10*3/uL (ref 4.0–10.5)
nRBC: 0 % (ref 0.0–0.2)

## 2022-12-29 LAB — ABO/RH: ABO/RH(D): A POS

## 2022-12-29 MED ORDER — IOHEXOL 350 MG/ML SOLN
100.0000 mL | Freq: Once | INTRAVENOUS | Status: AC | PRN
  Administered 2022-12-29: 80 mL via INTRAVENOUS

## 2022-12-29 MED ORDER — AMOXICILLIN-POT CLAVULANATE 875-125 MG PO TABS: 1.0000 | ORAL_TABLET | Freq: Two times a day (BID) | ORAL | 0 refills | Status: DC

## 2022-12-29 NOTE — ED Notes (Signed)
RN reviewed discharge instructions with pt. Pt verbalized understanding and had no further questions. VSS upon discharge.  

## 2022-12-29 NOTE — ED Provider Notes (Signed)
[place] EMERGENCY DEPARTMENT AT [facility] Provider Note   CSN: [id number] Arrival date & time: 12/29/22  1300     History  Chief Complaint  Patient presents with   Rectal Bleeding    Emily Compton is a 67 y.o. female.  Patient with history of GERD, anal cancer in 2014 presents today with complaints of rectal bleeding and abdominal pain. She states that same began in the night last night when she woke up with generalized abdominal pain. She went to the bathroom and saw bright red blood in the toilet.  She states that since then she has had persistent bleeding that is bright red.  She has not seen any clots.There is blood in the toilet as well as on the toilet paper when she wipes. There has also been blood in her underwear as well with mucus. She denies any history of abdominal surgeries. She had chemo and radiation for her anal cancer and achieved remission and has not had any issues since then. She has not had a colonoscopy since her diagnosis in 2014. She denies fevers, chills, nausea, vomiting, diarrhea. No night sweats or unintentional weight loss. She is not anticoagulated.   The history is provided by the patient. No language interpreter was used.  Rectal Bleeding Associated symptoms: abdominal pain        Home Medications Prior to Admission medications   Medication Sig Start Date End Date Taking? Authorizing Provider  diazepam (VALIUM) 10 MG tablet Take 1 tablet (10 mg total) by mouth every 12 (twelve) hours as needed. 10/20/22   Etta Grandchild, MD  levocetirizine (XYZAL) 5 MG tablet Take 1 tablet (5 mg total) by mouth every evening. 10/20/22   Etta Grandchild, MD  omeprazole (PRILOSEC) 40 MG capsule Take 1 capsule (40 mg total) by mouth daily. 03/08/22   Etta Grandchild, MD  oxyCODONE-acetaminophen (PERCOCET) 5-325 MG tablet Take 1 tablet by mouth every 6 (six) hours as needed for severe pain. 11/11/22   Etta Grandchild, MD  VITAMIN D PO Take by mouth.     [provider]      Allergies    Adhesive [tape]    Review of Systems   Review of Systems  Gastrointestinal:  Positive for abdominal pain, blood in stool and hematochezia.  All other systems reviewed and are negative.   Physical Exam Updated Vital Signs BP (!) 134/96 (BP Location: Right Arm)   Pulse 78   Temp 98.7 F (37.1 C) (Oral)   Resp 16   SpO2 98%  Physical Exam Vitals and nursing note reviewed. Exam conducted with a chaperone present.  Constitutional:      General: She is not in acute distress.    Appearance: Normal appearance. She is normal weight. She is not ill-appearing, toxic-appearing or diaphoretic.  HENT:     Head: Normocephalic and atraumatic.  Cardiovascular:     Rate and Rhythm: Normal rate.  Pulmonary:     Effort: Pulmonary effort is normal. No respiratory distress.  Abdominal:     General: Abdomen is flat.     Palpations: Abdomen is soft.     Tenderness: There is no guarding or rebound.     Comments: Generalized abdominal tenderness to palpation  Genitourinary:    Comments: Trace amount of bright red blood without clots present in the rectum. No bleeding present in the bed or patients underwear. No fissures or hemorrhoids palpated or visualized. Musculoskeletal:        General: Normal range  of motion.     Cervical back: Normal range of motion.  Skin:    General: Skin is warm and dry.  Neurological:     General: No focal deficit present.     Mental Status: She is alert.  Psychiatric:        Mood and Affect: Mood normal.        Behavior: Behavior normal.     ED Results / Procedures / Treatments   Labs (all labs ordered are listed, but only abnormal results are displayed) Labs Reviewed  COMPREHENSIVE METABOLIC PANEL - Abnormal; Notable for the following components:      Result Value   Glucose, Bld 108 (*)    All other components within normal limits  OCCULT BLOOD X 1 CARD TO LAB, STOOL - Abnormal; Notable for the following  components:   Fecal Occult Bld POSITIVE (*)    All other components within normal limits  CBC  ABO/RH    EKG None  Radiology CT Angio Abd/Pel W and/or Wo Contrast  Result Date: 12/29/2022 CLINICAL DATA:  Rectal bleeding.  History of anal cancer. EXAM: CTA ABDOMEN AND PELVIS WITHOUT AND WITH CONTRAST TECHNIQUE: Multidetector CT imaging of the abdomen and pelvis was performed using the standard protocol during bolus administration of intravenous contrast. Multiplanar reconstructed images and MIPs were obtained and reviewed to evaluate the vascular anatomy. RADIATION DOSE REDUCTION: This exam was performed according to the departmental dose-optimization program which includes automated exposure control, adjustment of the mA and/or kV according to patient size and/or use of iterative reconstruction technique. CONTRAST:  80mL OMNIPAQUE IOHEXOL 350 MG/ML SOLN COMPARISON:  May 26, 2014. FINDINGS: VASCULAR Aorta: Normal caliber aorta without aneurysm, dissection, vasculitis or significant stenosis. Celiac: Patent without evidence of aneurysm, dissection, vasculitis or significant stenosis. SMA: Patent without evidence of aneurysm, dissection, vasculitis or significant stenosis. Renals: Both renal arteries are patent without evidence of aneurysm, dissection, vasculitis, fibromuscular dysplasia or significant stenosis. IMA: Patent without evidence of aneurysm, dissection, vasculitis or significant stenosis. Inflow: Patent without evidence of aneurysm, dissection, vasculitis or significant stenosis. Proximal Outflow: Bilateral common femoral and visualized portions of the superficial and profunda femoral arteries are patent without evidence of aneurysm, dissection, vasculitis or significant stenosis. Veins: No obvious venous abnormality within the limitations of this arterial phase study. Review of the MIP images confirms the above findings. NON-VASCULAR Lower chest: No acute abnormality. Hepatobiliary: No  focal liver abnormality is seen. No gallstones, gallbladder wall thickening, or biliary dilatation. Pancreas: Unremarkable. No pancreatic ductal dilatation or surrounding inflammatory changes. Spleen: Normal in size without focal abnormality. Adrenals/Urinary Tract: Adrenal glands are unremarkable. Kidneys are normal, without renal calculi, focal lesion, or hydronephrosis. Bladder is unremarkable. Stomach/Bowel: Stomach and appendix are unremarkable. There is no evidence of bowel obstruction. Moderate wall thickening with surrounding inflammatory changes is seen involving distal transverse colon consistent with infectious or inflammatory colitis. No extravasation of contrast into the gastrointestinal tract is noted to suggest bleeding. Lymphatic: No adenopathy is noted. Reproductive: Uterus and bilateral adnexa are unremarkable. Other: No abdominal wall hernia or abnormality. No abdominopelvic ascites. Musculoskeletal: Status post surgical posterior fusion of L4-5. No acute osseous abnormality is noted. IMPRESSION: VASCULAR No evidence of gastrointestinal bleeding. NON-VASCULAR Moderate wall thickening with surrounding inflammatory changes is seen involving distal transverse colon consistent with infectious or inflammatory colitis. Electronically Signed   By: Lupita Raider M.D.   On: 12/29/2022 15:53    Procedures Procedures    Medications Ordered in ED Medications  iohexol (  OMNIPAQUE) 350 MG/ML injection 100 mL (80 mLs Intravenous Contrast Given 12/29/22 1524)    ED Course/ Medical Decision Making/ A&P                             Medical Decision Making Amount and/or Complexity of Data Reviewed Labs: ordered. Radiology: ordered.  Risk Prescription drug management.   This patient is a 67 y.o. female who presents to the ED for concern of rectal bleeding, this involves an extensive number of treatment options, and is a complaint that carries with it a high risk of complications and morbidity.  The emergent differential diagnosis prior to evaluation includes, but is not limited to,  high flow upper GI bleed, diverticulosis, vascular ectasia/AVM, inflammatory bowel disease, infectious colitis, mesenteric ischemia or ischemic colitis,  colorectal cancer or polyps, internal hemorrhoids, aortoenteric fistula, rectal foreign body, rectal ulceration or anal fissure.   This is not an exhaustive differential.   Past Medical History / Co-morbidities / Social History: history of GERD, anal cancer in 2014 status post chemo and radiation  Physical Exam: Physical exam performed. The pertinent findings include: generalized abdominal tenderness, trace blood present on exam without clots or pooling.  Lab Tests: I ordered, and personally interpreted labs.  The pertinent results include:  hgb 13.3. Hemoccult positive.   Imaging Studies: I ordered imaging studies including Ct angio abdomen pelvis. I independently visualized and interpreted imaging which showed   VASCULAR   No evidence of gastrointestinal bleeding.   NON-VASCULAR   Moderate wall thickening with surrounding inflammatory changes is seen involving distal transverse colon consistent with infectious or inflammatory colitis.  I agree with the radiologist interpretation.    Disposition: After consideration of the diagnostic results and the patients response to treatment, I feel that emergency department workup does not suggest an emergent condition requiring admission or immediate intervention beyond what has been performed at this time. The plan is: discharge with GI referral for follow-up and Augmentin for colitis. Patient is afebrile, nontoxic and in no acute distress with reassuring vital signs. She does not show any signs/symptoms of acute GI hemorrhage and CT imaging is reassuring. Hemoglobin is reassuring as well. She is not anticoagulated.  Evaluation and diagnostic testing in the emergency department does not suggest an  emergent condition requiring admission or immediate intervention beyond what has been performed at this time.  Plan for discharge with close PCP follow-up.  Patient is understanding and amenable with plan, educated on red flag symptoms that would prompt immediate return.  Patient discharged in stable condition.   Findings and plan of care discussed with supervising physician Dr. Adela Lank who is in agreement.   Final Clinical Impression(s) / ED Diagnoses Final diagnoses:  Rectal bleeding  Colitis    Rx / DC Orders ED Discharge Orders          Ordered    amoxicillin-clavulanate (AUGMENTIN) 875-125 MG tablet  Every 12 hours        12/29/22 1626          An After Visit Summary was printed and given to the patient.     Silva Bandy, PA-C 12/29/22 1628    Melene Plan, DO 12/29/22 2054

## 2022-12-29 NOTE — ED Triage Notes (Addendum)
Pt presents to ED POV. Pt c/o abd pain and rectal bleeding since last night. HTN since this morning. Pt reports that since last night she intermittently gets severe stomach cramping and then large amount of bright red blood comes out rectally. Pt reports now feeling slightly light headed and dizzy. Hx of anal cancer and hemorrhoids 10 years ago.

## 2022-12-29 NOTE — Discharge Instructions (Signed)
As we discussed, your workup in the ER today was reassuring for acute findings.  CT imaging did show signs of inflammation in your large intestine which is known as colitis.  I have given you a prescription for Augmentin which is an antibiotic for you to take as prescribed in its entirety to cover for any bacterial cause of this.  Have also given you a referral to a GI doctor with a number to call to schedule an appointment for follow-up.  Please call at your earliest convenience to schedule this appointment.  I also recommend that you call your primary doctor for follow-up as well.  Return if development of any new or worsening symptoms.

## 2023-01-09 ENCOUNTER — Encounter: Payer: Self-pay | Admitting: Internal Medicine

## 2023-02-20 ENCOUNTER — Telehealth: Payer: Self-pay | Admitting: *Deleted

## 2023-02-20 NOTE — Telephone Encounter (Signed)
I attempted to contact patient by telephone but was unsuccessful. According to the patient's chart they are due for follow up with  [facility]. I have left a HIPAA compliant message advising the patient to contact [facility] at [phone]. I will continue to follow up with the patient to make sure this appointment is scheduled.

## 2023-02-23 ENCOUNTER — Encounter: Payer: Self-pay | Admitting: *Deleted

## 2023-02-23 NOTE — Telephone Encounter (Signed)
I attempted to contact patient by telephone but was unsuccessful. According to the patient's chart they are due for follow up with  [facility]. I have left a HIPAA compliant message advising the patient to contact [facility] at [phone]. I will continue to follow up with the patient to make sure this appointment is scheduled.

## 2023-03-22 ENCOUNTER — Encounter: Payer: Self-pay | Admitting: Internal Medicine

## 2023-03-23 ENCOUNTER — Encounter: Payer: Self-pay | Admitting: Internal Medicine

## 2023-03-23 ENCOUNTER — Ambulatory Visit (INDEPENDENT_AMBULATORY_CARE_PROVIDER_SITE_OTHER): Payer: Medicare HMO | Admitting: Internal Medicine

## 2023-03-23 VITALS — BP 118/76 | HR 68 | Temp 98.1°F | Resp 16 | Ht 60.0 in | Wt 117.6 lb

## 2023-03-23 DIAGNOSIS — Z0000 Encounter for general adult medical examination without abnormal findings: Secondary | ICD-10-CM | POA: Diagnosis not present

## 2023-03-23 DIAGNOSIS — E785 Hyperlipidemia, unspecified: Secondary | ICD-10-CM | POA: Diagnosis not present

## 2023-03-23 DIAGNOSIS — R195 Other fecal abnormalities: Secondary | ICD-10-CM | POA: Diagnosis not present

## 2023-03-23 DIAGNOSIS — N1831 Chronic kidney disease, stage 3a: Secondary | ICD-10-CM

## 2023-03-23 DIAGNOSIS — Z23 Encounter for immunization: Secondary | ICD-10-CM

## 2023-03-23 DIAGNOSIS — Z0001 Encounter for general adult medical examination with abnormal findings: Secondary | ICD-10-CM

## 2023-03-23 DIAGNOSIS — R634 Abnormal weight loss: Secondary | ICD-10-CM | POA: Diagnosis not present

## 2023-03-23 DIAGNOSIS — R933 Abnormal findings on diagnostic imaging of other parts of digestive tract: Secondary | ICD-10-CM | POA: Insufficient documentation

## 2023-03-23 DIAGNOSIS — K2100 Gastro-esophageal reflux disease with esophagitis, without bleeding: Secondary | ICD-10-CM

## 2023-03-23 LAB — CBC WITH DIFFERENTIAL/PLATELET
Basophils Absolute: 0 10*3/uL (ref 0.0–0.1)
Basophils Relative: 0.5 % (ref 0.0–3.0)
Eosinophils Absolute: 0.1 10*3/uL (ref 0.0–0.7)
Eosinophils Relative: 0.9 % (ref 0.0–5.0)
HCT: 41.1 % (ref 36.0–46.0)
Hemoglobin: 13.1 g/dL (ref 12.0–15.0)
Lymphocytes Relative: 22.7 % (ref 12.0–46.0)
Lymphs Abs: 1.3 10*3/uL (ref 0.7–4.0)
MCHC: 32 g/dL (ref 30.0–36.0)
MCV: 87 fl (ref 78.0–100.0)
Monocytes Absolute: 0.7 10*3/uL (ref 0.1–1.0)
Monocytes Relative: 11.5 % (ref 3.0–12.0)
Neutro Abs: 3.8 10*3/uL (ref 1.4–7.7)
Neutrophils Relative %: 64.4 % (ref 43.0–77.0)
Platelets: 306 10*3/uL (ref 150.0–400.0)
RBC: 4.73 Mil/uL (ref 3.87–5.11)
RDW: 14.8 % (ref 11.5–15.5)
WBC: 5.9 10*3/uL (ref 4.0–10.5)

## 2023-03-23 LAB — LIPID PANEL
Cholesterol: 262 mg/dL — ABNORMAL HIGH (ref 0–200)
HDL: 71.9 mg/dL (ref >39.00)
LDL Cholesterol: 167 mg/dL — ABNORMAL HIGH (ref 0–99)
NonHDL: 189.79
Total CHOL/HDL Ratio: 4
Triglycerides: 113 mg/dL (ref 0.0–149.0)
VLDL: 22.6 mg/dL (ref 0.0–40.0)

## 2023-03-23 LAB — BASIC METABOLIC PANEL
BUN: 12 mg/dL (ref 6–23)
CO2: 29 meq/L (ref 19–32)
Calcium: 9.6 mg/dL (ref 8.4–10.5)
Chloride: 103 meq/L (ref 96–112)
Creatinine, Ser: 0.84 mg/dL (ref 0.40–1.20)
GFR: 71.03 mL/min (ref >60.00)
Glucose, Bld: 98 mg/dL (ref 70–99)
Potassium: 3.7 meq/L (ref 3.5–5.1)
Sodium: 140 meq/L (ref 135–145)

## 2023-03-23 LAB — HEPATIC FUNCTION PANEL
ALT: 13 U/L (ref 0–35)
AST: 17 U/L (ref 0–37)
Albumin: 4.2 g/dL (ref 3.5–5.2)
Alkaline Phosphatase: 53 U/L (ref 39–117)
Bilirubin, Direct: 0.1 mg/dL (ref 0.0–0.3)
Total Bilirubin: 0.5 mg/dL (ref 0.2–1.2)
Total Protein: 7.2 g/dL (ref 6.0–8.3)

## 2023-03-23 MED ORDER — OMEPRAZOLE 40 MG PO CPDR: 40.0000 mg | DELAYED_RELEASE_CAPSULE | Freq: Every day | ORAL | 0 refills | Status: DC

## 2023-03-23 NOTE — Progress Notes (Signed)
Subjective:  Patient ID: Emily Compton, female    DOB: 10-27-1955  Age: 67 y.o. MRN: [id number]  CC: Gastroesophageal Reflux, Annual Exam, and Abdominal Pain   HPI Beily Buscemi presents for f/up -  Discussed the use of AI scribe software for clinical note transcription with the patient, who gave verbal consent to proceed.  History of Present Illness   The patient, with a history of anal cancer, presented with concerns about recent gastrointestinal symptoms. They reported an episode of severe abdominal pain that woke them from sleep, followed by diarrhea that progressed to passing solid blood. This episode lasted the entire night and left them feeling woozy the following day, presumably due to blood loss. They reported that they are still passing a small amount of blood.  Since this episode, the patient has been experiencing milder versions of the same abdominal discomfort, waking them up early in the morning. They have also noticed a significant decrease in appetite and unintentional weight loss, dropping from 130 pounds to 116 pounds. They denied any nausea or vomiting.  The patient also reported new onset of severe GERD symptoms, which they have been managing by drinking small amounts of carbonated beverages and burping. They reported a sensation of fullness and discomfort prior to burping. They denied any pain in the lower abdomen or difficulty swallowing.  The patient noted that these symptoms started after they stopped taking omeprazole, which they had been taking for 90 days for throat drainage. They wondered if the abrupt cessation of omeprazole could have triggered their symptoms. They also reported taking Xyzal and oxycodone, the latter for occasional back pain.       Outpatient Medications Prior to Visit  Medication Sig Dispense Refill   diazepam (VALIUM) 10 MG tablet Take 1 tablet (10 mg total) by mouth every 12 (twelve) hours as needed. 60 tablet 3    oxyCODONE-acetaminophen (PERCOCET) 5-325 MG tablet Take 1 tablet by mouth every 6 (six) hours as needed for severe pain. 75 tablet 0   amoxicillin-clavulanate (AUGMENTIN) 875-125 MG tablet Take 1 tablet by mouth every 12 (twelve) hours. 14 tablet 0   levocetirizine (XYZAL) 5 MG tablet Take 1 tablet (5 mg total) by mouth every evening. 90 tablet 1   omeprazole (PRILOSEC) 40 MG capsule Take 1 capsule (40 mg total) by mouth daily. 90 capsule 1   VITAMIN D PO Take by mouth.     No facility-administered medications prior to visit.    ROS Review of Systems  Constitutional:  Positive for unexpected weight change. Negative for chills, diaphoresis and fatigue.  HENT:  Negative for trouble swallowing and voice change.   Respiratory:  Negative for cough, chest tightness, shortness of breath and wheezing.   Cardiovascular:  Negative for chest pain, palpitations and leg swelling.  Gastrointestinal:  Positive for abdominal pain and blood in stool. Negative for abdominal distention, constipation, diarrhea, nausea and vomiting.  Genitourinary: Negative.  Negative for difficulty urinating.  Musculoskeletal:  Positive for back pain. Negative for myalgias.  Skin: Negative.   Neurological:  Positive for dizziness and light-headedness. Negative for weakness and numbness.  Hematological:  Negative for adenopathy. Does not bruise/bleed easily.  Psychiatric/Behavioral: Negative.      Objective:  BP 118/76 (BP Location: Left Arm, Patient Position: Sitting, Cuff Size: Normal)   Pulse 68   Temp 98.1 F (36.7 C) (Oral)   Resp 16   Ht 5' (1.524 m)   Wt 117 lb 9.6 oz (53.3 kg)   SpO2 99%  BMI 22.97 kg/m   BP Readings from Last 3 Encounters:  03/23/23 118/76  12/29/22 122/79  10/20/22 114/64    Wt Readings from Last 3 Encounters:  03/23/23 117 lb 9.6 oz (53.3 kg)  10/20/22 130 lb (59 kg)  03/07/22 132 lb (59.9 kg)    Physical Exam Vitals reviewed. Exam conducted with a chaperone present Bernita Raisin).   Constitutional:      General: She is not in acute distress.    Appearance: She is not ill-appearing, toxic-appearing or diaphoretic.  HENT:     Mouth/Throat:     Mouth: Mucous membranes are moist.  Eyes:     General: No scleral icterus.    Conjunctiva/sclera: Conjunctivae normal.  Cardiovascular:     Rate and Rhythm: Normal rate.     Heart sounds: No murmur heard.    No friction rub. No gallop.  Pulmonary:     Effort: Pulmonary effort is normal.     Breath sounds: No stridor. No wheezing, rhonchi or rales.  Abdominal:     General: Abdomen is flat.     Palpations: There is no mass.     Tenderness: There is no abdominal tenderness. There is no guarding.     Hernia: No hernia is present.  Genitourinary:    Rectum: Normal. Guaiac result positive. No mass, tenderness, anal fissure, external hemorrhoid or internal hemorrhoid. Normal anal tone.  Musculoskeletal:        General: Normal range of motion.     Cervical back: Neck supple.     Right lower leg: No edema.     Left lower leg: No edema.  Lymphadenopathy:     Cervical: No cervical adenopathy.  Skin:    General: Skin is warm and dry.  Neurological:     General: No focal deficit present.     Mental Status: She is alert. Mental status is at baseline.  Psychiatric:        Mood and Affect: Mood normal.        Behavior: Behavior normal.     Lab Results  Component Value Date   WBC 5.9 03/23/2023   HGB 13.1 03/23/2023   HCT 41.1 03/23/2023   PLT 306.0 03/23/2023   GLUCOSE 98 03/23/2023   CHOL 262 (H) 03/23/2023   TRIG 113.0 03/23/2023   HDL 71.90 03/23/2023   LDLDIRECT 157.5 02/25/2013   LDLCALC 167 (H) 03/23/2023   ALT 13 03/23/2023   AST 17 03/23/2023   NA 140 03/23/2023   K 3.7 03/23/2023   CL 103 03/23/2023   CREATININE 0.84 03/23/2023   BUN 12 03/23/2023   CO2 29 03/23/2023   TSH 2.31 03/23/2023   INR 0.95 02/24/2011    CT Angio Abd/Pel W and/or Wo Contrast  Result Date: 12/29/2022 CLINICAL DATA:   Rectal bleeding.  History of anal cancer. EXAM: CTA ABDOMEN AND PELVIS WITHOUT AND WITH CONTRAST TECHNIQUE: Multidetector CT imaging of the abdomen and pelvis was performed using the standard protocol during bolus administration of intravenous contrast. Multiplanar reconstructed images and MIPs were obtained and reviewed to evaluate the vascular anatomy. RADIATION DOSE REDUCTION: This exam was performed according to the departmental dose-optimization program which includes automated exposure control, adjustment of the mA and/or kV according to patient size and/or use of iterative reconstruction technique. CONTRAST:  80mL OMNIPAQUE IOHEXOL 350 MG/ML SOLN COMPARISON:  May 26, 2014. FINDINGS: VASCULAR Aorta: Normal caliber aorta without aneurysm, dissection, vasculitis or significant stenosis. Celiac: Patent without evidence of aneurysm, dissection, vasculitis or significant stenosis.  SMA: Patent without evidence of aneurysm, dissection, vasculitis or significant stenosis. Renals: Both renal arteries are patent without evidence of aneurysm, dissection, vasculitis, fibromuscular dysplasia or significant stenosis. IMA: Patent without evidence of aneurysm, dissection, vasculitis or significant stenosis. Inflow: Patent without evidence of aneurysm, dissection, vasculitis or significant stenosis. Proximal Outflow: Bilateral common femoral and visualized portions of the superficial and profunda femoral arteries are patent without evidence of aneurysm, dissection, vasculitis or significant stenosis. Veins: No obvious venous abnormality within the limitations of this arterial phase study. Review of the MIP images confirms the above findings. NON-VASCULAR Lower chest: No acute abnormality. Hepatobiliary: No focal liver abnormality is seen. No gallstones, gallbladder wall thickening, or biliary dilatation. Pancreas: Unremarkable. No pancreatic ductal dilatation or surrounding inflammatory changes. Spleen: Normal in size  without focal abnormality. Adrenals/Urinary Tract: Adrenal glands are unremarkable. Kidneys are normal, without renal calculi, focal lesion, or hydronephrosis. Bladder is unremarkable. Stomach/Bowel: Stomach and appendix are unremarkable. There is no evidence of bowel obstruction. Moderate wall thickening with surrounding inflammatory changes is seen involving distal transverse colon consistent with infectious or inflammatory colitis. No extravasation of contrast into the gastrointestinal tract is noted to suggest bleeding. Lymphatic: No adenopathy is noted. Reproductive: Uterus and bilateral adnexa are unremarkable. Other: No abdominal wall hernia or abnormality. No abdominopelvic ascites. Musculoskeletal: Status post surgical posterior fusion of L4-5. No acute osseous abnormality is noted. IMPRESSION: VASCULAR No evidence of gastrointestinal bleeding. NON-VASCULAR Moderate wall thickening with surrounding inflammatory changes is seen involving distal transverse colon consistent with infectious or inflammatory colitis. Electronically Signed   By: Lupita Raider M.D.   On: 12/29/2022 15:53    Assessment & Plan:   Weight loss- Labs are negative for secondary causes. -     Lipid panel; Future -     Hepatic function panel; Future -     Basic metabolic panel; Future -     CEA; Future -     Ambulatory referral to Gastroenterology  Hyperlipidemia with target LDL less than 160- Statin is not indicated. -     Lipid panel; Future -     TSH; Future  Gastroesophageal reflux disease with esophagitis without hemorrhage- I have asked her to start taking the PPI. -     CBC with Differential/Platelet; Future -     Basic metabolic panel; Future -     Omeprazole; Take 1 capsule (40 mg total) by mouth daily.  Dispense: 90 capsule; Refill: 0 -     Ambulatory referral to Gastroenterology  Heme positive stool -     CEA; Future -     Ambulatory referral to Gastroenterology  Abnormal CT scan, colon -     CEA;  Future -     Ambulatory referral to Gastroenterology  Encounter for general adult medical examination with abnormal findings - Exam completed, labs reviewed, vaccines reviewed and updated, cancer screenings addressed, pt ed material was given.   Stage 3a chronic kidney disease (HCC)- Will avoid nephrotoxic agents.     Follow-up: Return in about 3 months (around 06/23/2023).  Sanda Linger, MD

## 2023-03-23 NOTE — Patient Instructions (Signed)
 Gastroesophageal Reflux Disease, Adult    Gastroesophageal reflux (GER) happens when acid from the stomach flows up into the tube that connects the mouth and the stomach (esophagus). Normally, food travels down the esophagus and stays in the stomach to be digested. However, when a person has GER, food and stomach acid sometimes move back up into the esophagus. If this becomes a more serious problem, the person may be diagnosed with a disease called gastroesophageal reflux disease (GERD). GERD occurs when the reflux:  Happens often.  Causes frequent or severe symptoms.  Causes problems such as damage to the esophagus.  When stomach acid comes in contact with the esophagus, the acid may cause inflammation in the esophagus. Over time, GERD may create small holes (ulcers) in the lining of the esophagus.  What are the causes?  This condition is caused by a problem with the muscle between the esophagus and the stomach (lower esophageal sphincter, or LES). Normally, the LES muscle closes after food passes through the esophagus to the stomach. When the LES is weakened or abnormal, it does not close properly, and that allows food and stomach acid to go back up into the esophagus.  The LES can be weakened by certain dietary substances, medicines, and medical conditions, including:  Tobacco use.  Pregnancy.  Having a hiatal hernia.  Alcohol use.  Certain foods and beverages, such as coffee, chocolate, onions, and peppermint.  What increases the risk?  You are more likely to develop this condition if you:  Have an increased body weight.  Have a connective tissue disorder.  Take NSAIDs, such as ibuprofen.  What are the signs or symptoms?  Symptoms of this condition include:  Heartburn.  Difficult or painful swallowing and the feeling of having a lump in the throat.  A bitter taste in the mouth.  Bad breath and having a large amount of saliva.  Having an upset or bloated stomach and belching.  Chest pain. Different conditions can  cause chest pain. Make sure you see your health care provider if you experience chest pain.  Shortness of breath or wheezing.  Ongoing (chronic) cough or a nighttime cough.  Wearing away of tooth enamel.  Weight loss.  How is this diagnosed?  This condition may be diagnosed based on a medical history and a physical exam. To determine if you have mild or severe GERD, your health care provider may also monitor how you respond to treatment. You may also have tests, including:  A test to examine your stomach and esophagus with a small camera (endoscopy).  A test that measures the acidity level in your esophagus.  A test that measures how much pressure is on your esophagus.  A barium swallow or modified barium swallow test to show the shape, size, and functioning of your esophagus.  How is this treated?  Treatment for this condition may vary depending on how severe your symptoms are. Your health care provider may recommend:  Changes to your diet.  Medicine.  Surgery.  The goal of treatment is to help relieve your symptoms and to prevent complications.  Follow these instructions at home:  Eating and drinking    Follow a diet as recommended by your health care provider. This may involve avoiding foods and drinks such as:  Coffee and tea, with or without caffeine.  Drinks that contain alcohol.  Energy drinks and sports drinks.  Carbonated drinks or sodas.  Chocolate and cocoa.  Peppermint and mint flavorings.  Garlic and onions.  Horseradish.  Spicy and acidic foods, including peppers, chili powder, curry powder, vinegar, hot sauces, and barbecue sauce.  Citrus fruit juices and citrus fruits, such as oranges, lemons, and limes.  Tomato-based foods, such as red sauce, chili, salsa, and pizza with red sauce.  Fried and fatty foods, such as donuts, french fries, potato chips, and high-fat dressings.  High-fat meats, such as hot dogs and fatty cuts of red and white meats, such as rib eye steak, sausage, ham, and  bacon.  High-fat dairy items, such as whole milk, butter, and cream cheese.  Eat small, frequent meals instead of large meals.  Avoid drinking large amounts of liquid with your meals.  Avoid eating meals during the 2-3 hours before bedtime.  Avoid lying down right after you eat.  Do not exercise right after you eat.  Lifestyle    Do not use any products that contain nicotine or tobacco. These products include cigarettes, chewing tobacco, and vaping devices, such as e-cigarettes. If you need help quitting, ask your health care provider.  Try to reduce your stress by using methods such as yoga or meditation. If you need help reducing stress, ask your health care provider.  If you are overweight, reduce your weight to an amount that is healthy for you. Ask your health care provider for guidance about a safe weight loss goal.  General instructions  Pay attention to any changes in your symptoms.  Take over-the-counter and prescription medicines only as told by your health care provider. Do not take aspirin, ibuprofen, or other NSAIDs unless your health care provider told you to take these medicines.  Wear loose-fitting clothing. Do not wear anything tight around your waist that causes pressure on your abdomen.  Raise (elevate) the head of your bed about 6 inches (15 cm). You can use a wedge to do this.  Avoid bending over if this makes your symptoms worse.  Keep all follow-up visits. This is important.  Contact a health care provider if:  You have:  New symptoms.  Unexplained weight loss.  Difficulty swallowing or it hurts to swallow.  Wheezing or a persistent cough.  A hoarse voice.  Your symptoms do not improve with treatment.  Get help right away if:  You have sudden pain in your arms, neck, jaw, teeth, or back.  You suddenly feel sweaty, dizzy, or light-headed.  You have chest pain or shortness of breath.  You vomit and the vomit is green, yellow, or black, or it looks like blood or coffee grounds.  You faint.  You  have stool that is red, bloody, or black.  You cannot swallow, drink, or eat.  These symptoms may represent a serious problem that is an emergency. Do not wait to see if the symptoms will go away. Get medical help right away. Call your local emergency services (911 in the [place] Do not drive yourself to the hospital.  Summary  Gastroesophageal reflux happens when acid from the stomach flows up into the esophagus. GERD is a disease in which the reflux happens often, causes frequent or severe symptoms, or causes problems such as damage to the esophagus.  Treatment for this condition may vary depending on how severe your symptoms are. Your health care provider may recommend diet and lifestyle changes, medicine, or surgery.  Contact a health care provider if you have new or worsening symptoms.  Take over-the-counter and prescription medicines only as told by your health care provider. Do not take aspirin, ibuprofen, or other NSAIDs  unless your health care provider told you to do so.  Keep all follow-up visits as told by your health care provider. This is important.  This information is not intended to replace advice given to you by your health care provider. Make sure you discuss any questions you have with your health care provider.  Document Revised: 01/25/2020 Document Reviewed: 01/27/2020  Elsevier Patient Education  2024 [employer].

## 2023-03-24 LAB — TSH: TSH: 2.31 u[IU]/mL (ref 0.35–5.50)

## 2023-03-24 LAB — CEA: CEA: <2 ng/mL

## 2023-03-24 MED ORDER — SHINGRIX 50 MCG/0.5ML IM SUSR
0.5000 mL | Freq: Once | INTRAMUSCULAR | 1 refills | Status: AC
Start: 2023-03-24 — End: 2023-03-24

## 2023-03-29 ENCOUNTER — Encounter: Payer: Self-pay | Admitting: Internal Medicine

## 2023-05-25 ENCOUNTER — Ambulatory Visit: Payer: Medicare HMO | Admitting: Internal Medicine

## 2023-05-25 ENCOUNTER — Encounter: Payer: Self-pay | Admitting: Internal Medicine

## 2023-05-25 VITALS — BP 94/60 | HR 72 | Ht 59.5 in | Wt 115.2 lb

## 2023-05-25 DIAGNOSIS — Z1211 Encounter for screening for malignant neoplasm of colon: Secondary | ICD-10-CM

## 2023-05-25 DIAGNOSIS — Z8719 Personal history of other diseases of the digestive system: Secondary | ICD-10-CM

## 2023-05-25 DIAGNOSIS — Z85048 Personal history of other malignant neoplasm of rectum, rectosigmoid junction, and anus: Secondary | ICD-10-CM | POA: Diagnosis not present

## 2023-05-25 DIAGNOSIS — R933 Abnormal findings on diagnostic imaging of other parts of digestive tract: Secondary | ICD-10-CM | POA: Diagnosis not present

## 2023-05-25 MED ORDER — NA SULFATE-K SULFATE-MG SULF 17.5-3.13-1.6 GM/177ML PO SOLN: 1.0000 | Freq: Once | ORAL | 0 refills | Status: AC

## 2023-05-25 MED ORDER — HYOSCYAMINE SULFATE 0.125 MG SL SUBL: 0.1250 mg | SUBLINGUAL_TABLET | Freq: Four times a day (QID) | SUBLINGUAL | 2 refills | Status: DC | PRN

## 2023-05-25 NOTE — Patient Instructions (Signed)
You have been scheduled for a colonoscopy. Please follow written instructions given to you at your visit today.   Please pick up your prep supplies at the pharmacy within the next 1-3 days.  If you use inhalers (even only as needed), please bring them with you on the day of your procedure.  DO NOT TAKE 7 DAYS PRIOR TO TEST- Trulicity (dulaglutide) Ozempic, Wegovy (semaglutide) Mounjaro (tirzepatide) Bydureon Bcise (exanatide extended release)  DO NOT TAKE 1 DAY PRIOR TO YOUR TEST Rybelsus (semaglutide) Adlyxin (lixisenatide) Victoza (liraglutide) Byetta (exanatide) _________________________________________________________________________  _______________________________________________________  If your blood pressure at your visit was 140/90 or greater, please contact your primary care physician to follow up on this.  _______________________________________________________  If you are age 69 or older, your body mass index should be between 23-30. Your Body mass index is 22.89 kg/m. If this is out of the aforementioned range listed, please consider follow up with your Primary Care Provider.  If you are age 11 or younger, your body mass index should be between 19-25. Your Body mass index is 22.89 kg/m. If this is out of the aformentioned range listed, please consider follow up with your Primary Care Provider.   ________________________________________________________  The [facility] providers would like to encourage you to use [facility] to communicate with providers for non-urgent requests or questions.  Due to long hold times on the telephone, sending your provider a message by [facility] may be a faster and more efficient way to get a response.  Please allow 48 business hours for a response.  Please remember that this is for non-urgent requests.  _______________________________________________________

## 2023-05-25 NOTE — Progress Notes (Signed)
Patient ID: Renleigh Medic, female   DOB: 06-Jul-1956, 67 y.o.   MRN: [id number] HPI: Jenae Meader is a 67 year old female with a history of anal cancer treated with radiation therapy, possible prior GERD who is seen to reestablish gastroenterology care after having an episode of acute colitis in May 2024.  She is here alone today.  This is our first visit but she has GI history with Dr. Arlyce Dice, Dr. Randa Evens and briefly Dr. Madilyn Fireman at [place].  Her last colonoscopy was in 2012 with Dr. Arlyce Dice.  Regarding her anal cancer history she was diagnosed in 2014 but was having intermittent bleeding that was felt possible hemorrhoidal related in 2012.  Anal cancer was not seen at her colonoscopy in 2012 nor at her hemorrhoidal banding in early 2014.  Later in 2014 she saw Dr. Maisie Fus when the anal cancer was diagnosed and subsequently treated.  In May acutely after going to bed on 12/28/2022 she woke up with severe upper and mid abdominal pain crampy in nature.  Initially loose stools became bloody stools and about 12 hours later she went to the emergency department.  There a CT scan suggested colitis at the splenic flexure and she was treated with antibiotics.  For about a week she had some abdominal symptoms but no further severe pain and symptoms have resolved entirely.  Since this time she has been a little afraid to eat and has lost about 18 pounds but is not having any abdominal pain with eating.  She can occasionally have some crampiness but this is difficult to predict and mild in nature.  No further blood in stool.  She has "GERD" diagnosis on her medical record but she states that she has intermittent heartburn and is used omeprazole 20 mg over-the-counter and occasionally 40 mg prescribed by Dr. Yetta Barre.  She is not taking any antacids now.  She is not having heartburn, dysphagia or odynophagia.  Her weight is down overall since her colitis episode of about 18 pounds.  She remains quite active.  No family  history of anal or rectal cancer.  Her mother had breast cancer, uterine cancer and glioblastoma.  She is retired, never a tobacco user.  Occasional alcohol intake.  Past Medical History:  Diagnosis Date   Anal cancer (HCC)    Cancer (HCC) 04/30/2013   anal =poorly diff squamous cell ca    Dental crowns present    and caps   GERD (gastroesophageal reflux disease)    prn OTC   History of radiation therapy 05/27/13-07/05/13   anal canal/50.4Gy/61fx   Trigger middle finger of right hand 12/31/2011    Past Surgical History:  Procedure Laterality Date   ECTOPIC PREGNANCY SURGERY     with left fallopian tube removal   FLEXIBLE SIGMOIDOSCOPY  08/29/2012   Procedure: FLEXIBLE SIGMOIDOSCOPY;  Surgeon: Louis Meckel, MD;  Location: [facility];  Service: Endoscopy;  Laterality: N/A;   HEMORRHOID BANDING  08/29/2012   Procedure: HEMORRHOID BANDING;  Surgeon: Louis Meckel, MD;  Location: [facility];  Service: Endoscopy;  Laterality: N/A;   LUMBAR FUSION  03/02/2011   right transforaminal lumbar fusion L4-5; left posterolateral fusion L4-5   TONSILLECTOMY  age 50   TRIGGER FINGER RELEASE  01/12/2012   Procedure: RELEASE TRIGGER FINGER/A-1 PULLEY;  Surgeon: Tami Ribas, MD;  Location: [facility];  Service: Orthopedics;  Laterality: Right;  right long trigger release    Outpatient Medications Prior to Visit  Medication Sig Dispense Refill   diazepam (  VALIUM) 10 MG tablet Take 1 tablet (10 mg total) by mouth every 12 (twelve) hours as needed. 60 tablet 3   oxyCODONE-acetaminophen (PERCOCET) 5-325 MG tablet Take 1 tablet by mouth every 6 (six) hours as needed for severe pain. 75 tablet 0   omeprazole (PRILOSEC) 40 MG capsule Take 1 capsule (40 mg total) by mouth daily. (Patient not taking: Reported on 05/25/2023) 90 capsule 0   No facility-administered medications prior to visit.    Allergies  Allergen Reactions   Adhesive [Tape] Rash    Family History  Problem  Relation Age of Onset   Breast cancer Mother    Uterine cancer Mother    Brain cancer Mother    Cancer Mother        breast & uterin & brain   Colon cancer Neg Hx     Social History   Tobacco Use   Smoking status: Never   Smokeless tobacco: Never  Vaping Use   Vaping status: Never Used  Substance Use Topics   Alcohol use: Yes    Comment: 4 beers per week   Drug use: No    ROS: As per history of present illness, otherwise negative  BP 94/60 (BP Location: Left Arm, Patient Position: Sitting, Cuff Size: Normal)   Pulse 72   Ht 4' 11.5" (1.511 m) Comment: height measured without shoes  Wt 115 lb 4 oz (52.3 kg)   BMI 22.89 kg/m  Gen: awake, alert, NAD HEENT: anicteric  CV: RRR, no mrg Pulm: CTA b/l Abd: soft, NT/ND, +BS throughout Ext: no c/c/e Neuro: nonfocal   RELEVANT LABS AND IMAGING: CBC    Component Value Date/Time   WBC 5.9 03/23/2023 0930   RBC 4.73 03/23/2023 0930   HGB 13.1 03/23/2023 0930   HGB 11.9 11/15/2013 1018   HCT 41.1 03/23/2023 0930   HCT 36.5 11/15/2013 1018   PLT 306.0 03/23/2023 0930   PLT 181 11/15/2013 1018   MCV 87.0 03/23/2023 0930   MCV 90.6 11/15/2013 1018   MCH 28.4 12/29/2022 1322   MCHC 32.0 03/23/2023 0930   RDW 14.8 03/23/2023 0930   RDW 13.6 11/15/2013 1018   LYMPHSABS 1.3 03/23/2023 0930   LYMPHSABS 1.0 11/15/2013 1018   MONOABS 0.7 03/23/2023 0930   MONOABS 0.4 11/15/2013 1018   EOSABS 0.1 03/23/2023 0930   EOSABS 0.2 11/15/2013 1018   BASOSABS 0.0 03/23/2023 0930   BASOSABS 0.0 11/15/2013 1018    CMP     Component Value Date/Time   NA 140 03/23/2023 0930   NA 143 05/12/2014 0855   K 3.7 03/23/2023 0930   K 4.2 05/12/2014 0855   CL 103 03/23/2023 0930   CO2 29 03/23/2023 0930   CO2 28 05/12/2014 0855   GLUCOSE 98 03/23/2023 0930   GLUCOSE 93 05/12/2014 0855   BUN 12 03/23/2023 0930   BUN 11.0 05/12/2014 0855   CREATININE 0.84 03/23/2023 0930   CREATININE 0.82 03/23/2020 1405   CREATININE 0.8 05/12/2014  0855   CALCIUM 9.6 03/23/2023 0930   CALCIUM 9.9 05/12/2014 0855   PROT 7.2 03/23/2023 0930   PROT 7.0 07/01/2013 0948   ALBUMIN 4.2 03/23/2023 0930   ALBUMIN 3.8 07/01/2013 0948   AST 17 03/23/2023 0930   AST 32 07/01/2013 0948   ALT 13 03/23/2023 0930   ALT 37 07/01/2013 0948   ALKPHOS 53 03/23/2023 0930   ALKPHOS 54 07/01/2013 0948   BILITOT 0.5 03/23/2023 0930   BILITOT 0.34 07/01/2013 0948   GFRNONAA >  60 12/29/2022 1322   GFRNONAA 75 03/23/2020 1405   GFRAA 86 03/23/2020 1405   CTA ABDOMEN AND PELVIS WITHOUT AND WITH CONTRAST   TECHNIQUE: Multidetector CT imaging of the abdomen and pelvis was performed using the standard protocol during bolus administration of intravenous contrast. Multiplanar reconstructed images and MIPs were obtained and reviewed to evaluate the vascular anatomy.   RADIATION DOSE REDUCTION: This exam was performed according to the departmental dose-optimization program which includes automated exposure control, adjustment of the mA and/or kV according to patient size and/or use of iterative reconstruction technique.   CONTRAST:  80mL OMNIPAQUE IOHEXOL 350 MG/ML SOLN   COMPARISON:  May 26, 2014.   FINDINGS: VASCULAR   Aorta: Normal caliber aorta without aneurysm, dissection, vasculitis or significant stenosis.   Celiac: Patent without evidence of aneurysm, dissection, vasculitis or significant stenosis.   SMA: Patent without evidence of aneurysm, dissection, vasculitis or significant stenosis.   Renals: Both renal arteries are patent without evidence of aneurysm, dissection, vasculitis, fibromuscular dysplasia or significant stenosis.   IMA: Patent without evidence of aneurysm, dissection, vasculitis or significant stenosis.   Inflow: Patent without evidence of aneurysm, dissection, vasculitis or significant stenosis.   Proximal Outflow: Bilateral common femoral and visualized portions of the superficial and profunda femoral  arteries are patent without evidence of aneurysm, dissection, vasculitis or significant stenosis.   Veins: No obvious venous abnormality within the limitations of this arterial phase study.   Review of the MIP images confirms the above findings.   NON-VASCULAR   Lower chest: No acute abnormality.   Hepatobiliary: No focal liver abnormality is seen. No gallstones, gallbladder wall thickening, or biliary dilatation.   Pancreas: Unremarkable. No pancreatic ductal dilatation or surrounding inflammatory changes.   Spleen: Normal in size without focal abnormality.   Adrenals/Urinary Tract: Adrenal glands are unremarkable. Kidneys are normal, without renal calculi, focal lesion, or hydronephrosis. Bladder is unremarkable.   Stomach/Bowel: Stomach and appendix are unremarkable. There is no evidence of bowel obstruction. Moderate wall thickening with surrounding inflammatory changes is seen involving distal transverse colon consistent with infectious or inflammatory colitis. No extravasation of contrast into the gastrointestinal tract is noted to suggest bleeding.   Lymphatic: No adenopathy is noted.   Reproductive: Uterus and bilateral adnexa are unremarkable.   Other: No abdominal wall hernia or abnormality. No abdominopelvic ascites.   Musculoskeletal: Status post surgical posterior fusion of L4-5. No acute osseous abnormality is noted.   IMPRESSION: VASCULAR   No evidence of gastrointestinal bleeding.   NON-VASCULAR   Moderate wall thickening with surrounding inflammatory changes is seen involving distal transverse colon consistent with infectious or inflammatory colitis.     Electronically Signed   By: Lupita Raider M.D.   On: 12/29/2022 15:53   ASSESSMENT/PLAN: 66 year old female with a history of anal cancer treated with radiation therapy, possible prior GERD who is seen to reestablish gastroenterology care after having an episode of acute colitis in May  2024.   Acute colitis/abnormal CT of the colon --we discussed her episode at length today and it is consistent with ischemic colitis.  We discussed the etiology of ischemic colitis and fortunately she does not have any significant mesenteric vascular disease by CTA.  Her colitis has resolved and odds are it will not recur.  I do recommend colonoscopy to exclude other pathology such as inflammatory bowel disease as well as perform a screening exam as her last colonoscopy was 12 years ago.  We reviewed the risk, benefits and alternatives  and she is agreeable and wishes to proceed after the new year -- Resolved ischemic colitis -- Colonoscopy in the [facility]  2.  History of anal cancer --treated with by Dr. Maisie Fus and status post XRT.  Colonoscopy as above  3.  Periodic abdominal cramping --not felt related to an acute ischemic colitis which has resolved but possibly a more of an irritable bowel type response.  Colonoscopy plan as above -- Levsin 0.125 mg sublingual every 6 hours as needed for crampy abdominal pain  4.  Colon cancer screening --last colonoscopy 2012, colonoscopy as above    ZD:GUYQI, Bernadene Bell, Md [address] [place],  [place] [address]

## 2023-06-22 ENCOUNTER — Other Ambulatory Visit: Payer: Self-pay | Admitting: Internal Medicine

## 2023-06-22 DIAGNOSIS — F411 Generalized anxiety disorder: Secondary | ICD-10-CM

## 2023-08-15 ENCOUNTER — Encounter: Payer: Self-pay | Admitting: Internal Medicine

## 2023-08-15 ENCOUNTER — Encounter: Payer: Medicare HMO | Admitting: Internal Medicine

## 2023-08-15 ENCOUNTER — Ambulatory Visit: Payer: HMO | Admitting: Internal Medicine

## 2023-08-15 VITALS — BP 112/66 | HR 69 | Temp 98.3°F | Resp 11

## 2023-08-15 DIAGNOSIS — K5730 Diverticulosis of large intestine without perforation or abscess without bleeding: Secondary | ICD-10-CM

## 2023-08-15 DIAGNOSIS — Z1211 Encounter for screening for malignant neoplasm of colon: Secondary | ICD-10-CM | POA: Diagnosis not present

## 2023-08-15 DIAGNOSIS — D124 Benign neoplasm of descending colon: Secondary | ICD-10-CM | POA: Diagnosis not present

## 2023-08-15 DIAGNOSIS — K648 Other hemorrhoids: Secondary | ICD-10-CM

## 2023-08-15 MED ORDER — SODIUM CHLORIDE 0.9 % IV SOLN
500.0000 mL | Freq: Once | INTRAVENOUS | Status: DC
Start: 2023-08-15 — End: 2023-08-15

## 2023-08-15 NOTE — Progress Notes (Signed)
 Sedate, gd SR, tolerated procedure well, VSS, report to RN

## 2023-08-15 NOTE — Progress Notes (Signed)
 GASTROENTEROLOGY PROCEDURE H&P NOTE   Primary Care Physician: Joshua Debby CROME, MD    Reason for Procedure:   Colon cancer screening, hx of anal cancer  Plan:    colonoscopy  Patient is appropriate for endoscopic procedure(s) in the ambulatory ([facility]) setting.  The nature of the procedure, as well as the risks, benefits, and alternatives were carefully and thoroughly reviewed with the patient. Ample time for discussion and questions allowed. The patient understood, was satisfied, and agreed to proceed.     HPI: Emily Compton is a 68 y.o. female who presents for colonoscopy.  Medical history as below.  Tolerated the prep.  No recent chest pain or shortness of breath.  No abdominal pain today.  Past Medical History:  Diagnosis Date   Anal cancer (HCC)    Cancer (HCC) 04/30/2013   anal =poorly diff squamous cell ca    Dental crowns present    and caps   GERD (gastroesophageal reflux disease)    prn OTC   History of radiation therapy 05/27/13-07/05/13   anal canal/50.4Gy/29fx   Trigger middle finger of right hand 12/31/2011    Past Surgical History:  Procedure Laterality Date   ECTOPIC PREGNANCY SURGERY     with left fallopian tube removal   FLEXIBLE SIGMOIDOSCOPY  08/29/2012   Procedure: FLEXIBLE SIGMOIDOSCOPY;  Surgeon: Lamar JONETTA Aho, MD;  Location: [facility];  Service: Endoscopy;  Laterality: N/A;   HEMORRHOID BANDING  08/29/2012   Procedure: HEMORRHOID BANDING;  Surgeon: Lamar JONETTA Aho, MD;  Location: [facility];  Service: Endoscopy;  Laterality: N/A;   LUMBAR FUSION  03/02/2011   right transforaminal lumbar fusion L4-5; left posterolateral fusion L4-5   TONSILLECTOMY  age 89   TRIGGER FINGER RELEASE  01/12/2012   Procedure: RELEASE TRIGGER FINGER/A-1 PULLEY;  Surgeon: Franky JONELLE Curia, MD;  Location: [facility];  Service: Orthopedics;  Laterality: Right;  right long trigger release    Prior to Admission medications   Medication Sig Start  Date End Date Taking? Authorizing Provider  diazepam  (VALIUM ) 10 MG tablet TAKE 1 TABLET BY MOUTH EVERY 12 HOURS AS NEEDED 06/22/23   Joshua Debby CROME, MD  hyoscyamine  (LEVSIN AMIEL) 0.125 MG SL tablet Place 1 tablet (0.125 mg total) under the tongue every 6 (six) hours as needed. 05/25/23   Taunja Brickner, Gordy HERO, MD  omeprazole  (PRILOSEC) 40 MG capsule Take 1 capsule (40 mg total) by mouth daily. Patient not taking: Reported on 05/25/2023 03/23/23   Joshua Debby CROME, MD  oxyCODONE -acetaminophen  (PERCOCET) 5-325 MG tablet Take 1 tablet by mouth every 6 (six) hours as needed for severe pain. 11/11/22   Joshua Debby CROME, MD    Current Outpatient Medications  Medication Sig Dispense Refill   diazepam  (VALIUM ) 10 MG tablet TAKE 1 TABLET BY MOUTH EVERY 12 HOURS AS NEEDED 60 tablet 1   hyoscyamine  (LEVSIN /SL) 0.125 MG SL tablet Place 1 tablet (0.125 mg total) under the tongue every 6 (six) hours as needed. 30 tablet 2   omeprazole  (PRILOSEC) 40 MG capsule Take 1 capsule (40 mg total) by mouth daily. (Patient not taking: Reported on 05/25/2023) 90 capsule 0   oxyCODONE -acetaminophen  (PERCOCET) 5-325 MG tablet Take 1 tablet by mouth every 6 (six) hours as needed for severe pain. 75 tablet 0   Current Facility-Administered Medications  Medication Dose Route Frequency Provider Last Rate Last Admin   0.9 %  sodium chloride  infusion  500 mL Intravenous Once Roc Streett, Gordy HERO, MD  Allergies as of 08/15/2023 - Review Complete 08/15/2023  Allergen Reaction Noted   Adhesive [tape] Rash 01/05/2012    Family History  Problem Relation Age of Onset   Breast cancer Mother    Uterine cancer Mother    Brain cancer Mother    Cancer Mother        breast & uterin & brain   Colon cancer Neg Hx     Social History   Socioeconomic History   Marital status: Married    Spouse name: Not on file   Number of children: 0   Years of education: Not on file   Highest education level: Not on file  Occupational History    Occupation: retired    Comment: middle school    Employer: [employer]  Tobacco Use   Smoking status: Never   Smokeless tobacco: Never  Vaping Use   Vaping status: Never Used  Substance and Sexual Activity   Alcohol use: Yes    Comment: 4 beers per week   Drug use: No   Sexual activity: Not Currently  Other Topics Concern   Not on file  Social History Narrative   Married, husband Joe   No children   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

## 2023-08-15 NOTE — Op Note (Signed)
 [facility] Patient Name: Emily Compton Procedure Date: 08/15/2023 2:03 PM MRN: [id number] Endoscopist: Gordy CHRISTELLA Starch , MD, [id number] Age: 68 Referring MD:  Date of Birth: 1956-04-14 Gender: Female Account #: [id number] Procedure:                Colonoscopy Indications:              Screening for colorectal malignant neoplasm, hx of                            anal cancer (2014), presumed and now resolved                            ischemic colitis in May 2024 Medicines:                Monitored Anesthesia Care Procedure:                Pre-Anesthesia Assessment:                           - Prior to the procedure, a History and Physical                            was performed, and patient medications and                            allergies were reviewed. The patient's tolerance of                            previous anesthesia was also reviewed. The risks                            and benefits of the procedure and the sedation                            options and risks were discussed with the patient.                            All questions were answered, and informed consent                            was obtained. Prior Anticoagulants: The patient has                            taken no anticoagulant or antiplatelet agents. ASA                            Grade Assessment: II - A patient with mild systemic                            disease. After reviewing the risks and benefits,                            the patient was deemed in satisfactory condition to  undergo the procedure.                           After obtaining informed consent, the colonoscope                            was passed under direct vision. Throughout the                            procedure, the patient's blood pressure, pulse, and                            oxygen saturations were monitored continuously. The                            [id number]-HQ190L Colonoscope  [id number] was introduced                            through the anus and advanced to the terminal                            ileum. The colonoscopy was performed without                            difficulty. The patient tolerated the procedure                            well. The quality of the bowel preparation was                            excellent. The terminal ileum, ileocecal valve,                            appendiceal orifice, and rectum were photographed. Scope In: 2:11:58 PM Scope Out: 2:25:11 PM Scope Withdrawal Time: 0 hours 10 minutes 0 seconds  Total Procedure Duration: 0 hours 13 minutes 13 seconds  Findings:                 The digital rectal exam was normal.                           The terminal ileum appeared normal.                           An 8 mm polyp was found in the descending colon.                            The polyp was sessile. The polyp was removed with a                            cold snare. Resection and retrieval were complete.                           Multiple medium-mouthed and small-mouthed  diverticula were found in the sigmoid colon and                            splenic flexure.                           Retroflexion in the rectum was not performed due to                            narrow rectal vault. Multiple views of rectum and                            anal canal obtained in forward view without                            additional abnormalities.                           Internal hemorrhoids were found during endoscopy.                            The hemorrhoids were small. Complications:            No immediate complications. Estimated Blood Loss:     Estimated blood loss: none. Impression:               - The examined portion of the ileum was normal.                           - One 8 mm polyp in the descending colon, removed                            with a cold snare. Resected and retrieved.                            - Mild diverticulosis in the sigmoid colon and at                            the splenic flexure.                           - Otherwise normal colon with no evidence of                            colitis.                           - Small internal hemorrhoids. Recommendation:           - Patient has a contact number available for                            emergencies. The signs and symptoms of potential                            delayed complications were discussed with the  patient. Return to normal activities tomorrow.                            Written discharge instructions were provided to the                            patient.                           - Resume previous diet.                           - Continue present medications.                           - Await pathology results.                           - Repeat colonoscopy is recommended for                            surveillance. The colonoscopy date will be                            determined after pathology results from today's                            exam become available for review. Gordy CHRISTELLA Starch, MD 08/15/2023 2:30:43 PM This report has been signed electronically.

## 2023-08-15 NOTE — Progress Notes (Signed)
 Pt's states no medical or surgical changes since previsit or office visit.

## 2023-08-15 NOTE — Progress Notes (Signed)
 Called to room to assist during endoscopic procedure.  Patient ID and intended procedure confirmed with present staff. Received instructions for my participation in the procedure from the performing physician.

## 2023-08-15 NOTE — Patient Instructions (Signed)
 Please read handouts provided. Continue present medications. Await pathology results.   YOU HAD AN ENDOSCOPIC PROCEDURE TODAY AT THE [facility]:   Refer to the procedure report that was given to you for any specific questions about what was found during the examination.  If the procedure report does not answer your questions, please call your gastroenterologist to clarify.  If you requested that your care partner not be given the details of your procedure findings, then the procedure report has been included in a sealed envelope for you to review at your convenience later.  YOU SHOULD EXPECT: Some feelings of bloating in the abdomen. Passage of more gas than usual.  Walking can help get rid of the air that was put into your GI tract during the procedure and reduce the bloating. If you had a lower endoscopy (such as a colonoscopy or flexible sigmoidoscopy) you may notice spotting of blood in your stool or on the toilet paper. If you underwent a bowel prep for your procedure, you may not have a normal bowel movement for a few days.  Please Note:  You might notice some irritation and congestion in your nose or some drainage.  This is from the oxygen used during your procedure.  There is no need for concern and it should clear up in a day or so.  SYMPTOMS TO REPORT IMMEDIATELY:  Following lower endoscopy (colonoscopy or flexible sigmoidoscopy):  Excessive amounts of blood in the stool  Significant tenderness or worsening of abdominal pains  Swelling of the abdomen that is new, acute  Fever of 100F or higher  For urgent or emergent issues, a gastroenterologist can be reached at any hour by calling [phone]. Do not use MyChart messaging for urgent concerns.    DIET:  We do recommend a small meal at first, but then you may proceed to your regular diet.  Drink plenty of fluids but you should avoid alcoholic beverages for 24 hours.  ACTIVITY:  You should plan to take it easy for  the rest of today and you should NOT DRIVE or use heavy machinery until tomorrow (because of the sedation medicines used during the test).    FOLLOW UP: Our staff will call the number listed on your records the next business day following your procedure.  We will call around 7:15- 8:00 am to check on you and address any questions or concerns that you may have regarding the information given to you following your procedure. If we do not reach you, we will leave a message.     If any biopsies were taken you will be contacted by phone or by letter within the next 1-3 weeks.  Please call us at [phone] if you have not heard about the biopsies in 3 weeks.    SIGNATURES/CONFIDENTIALITY: You and/or your care partner have signed paperwork which will be entered into your electronic medical record.  These signatures attest to the fact that that the information above on your After Visit Summary has been reviewed and is understood.  Full responsibility of the confidentiality of this discharge information lies with you and/or your care-partner.

## 2023-08-16 ENCOUNTER — Telehealth: Payer: Self-pay

## 2023-08-16 NOTE — Telephone Encounter (Signed)
 LMOM

## 2023-08-18 ENCOUNTER — Encounter: Payer: Self-pay | Admitting: Internal Medicine

## 2023-08-18 LAB — SURGICAL PATHOLOGY

## 2023-09-18 ENCOUNTER — Other Ambulatory Visit: Payer: Self-pay | Admitting: Internal Medicine

## 2023-09-18 DIAGNOSIS — M48061 Spinal stenosis, lumbar region without neurogenic claudication: Secondary | ICD-10-CM

## 2023-09-21 MED ORDER — OXYCODONE-ACETAMINOPHEN 5-325 MG PO TABS
1.0000 | ORAL_TABLET | Freq: Four times a day (QID) | ORAL | 0 refills | Status: DC | PRN
Start: 2023-09-21 — End: 2024-06-11

## 2023-09-25 DIAGNOSIS — H524 Presbyopia: Secondary | ICD-10-CM | POA: Diagnosis not present

## 2023-09-25 DIAGNOSIS — H2513 Age-related nuclear cataract, bilateral: Secondary | ICD-10-CM | POA: Diagnosis not present

## 2023-09-25 DIAGNOSIS — H35373 Puckering of macula, bilateral: Secondary | ICD-10-CM | POA: Diagnosis not present

## 2024-01-11 ENCOUNTER — Other Ambulatory Visit: Payer: Self-pay | Admitting: Internal Medicine

## 2024-01-11 DIAGNOSIS — F411 Generalized anxiety disorder: Secondary | ICD-10-CM

## 2024-01-13 ENCOUNTER — Encounter: Payer: Self-pay | Admitting: Internal Medicine

## 2024-05-31 ENCOUNTER — Ambulatory Visit: Admitting: Nurse Practitioner

## 2024-06-06 ENCOUNTER — Ambulatory Visit: Admitting: Internal Medicine

## 2024-06-06 ENCOUNTER — Encounter: Payer: Self-pay | Admitting: Internal Medicine

## 2024-06-06 ENCOUNTER — Ambulatory Visit: Payer: Self-pay | Admitting: Internal Medicine

## 2024-06-06 VITALS — BP 120/72 | HR 67 | Temp 98.1°F | Resp 16 | Ht 59.5 in | Wt 115.4 lb

## 2024-06-06 DIAGNOSIS — R10811 Right upper quadrant abdominal tenderness: Secondary | ICD-10-CM | POA: Insufficient documentation

## 2024-06-06 DIAGNOSIS — F411 Generalized anxiety disorder: Secondary | ICD-10-CM | POA: Diagnosis not present

## 2024-06-06 DIAGNOSIS — K219 Gastro-esophageal reflux disease without esophagitis: Secondary | ICD-10-CM

## 2024-06-06 DIAGNOSIS — M48061 Spinal stenosis, lumbar region without neurogenic claudication: Secondary | ICD-10-CM

## 2024-06-06 DIAGNOSIS — E785 Hyperlipidemia, unspecified: Secondary | ICD-10-CM | POA: Diagnosis not present

## 2024-06-06 DIAGNOSIS — R002 Palpitations: Secondary | ICD-10-CM

## 2024-06-06 LAB — LIPID PANEL
Cholesterol: 287 mg/dL — ABNORMAL HIGH (ref 0–200)
HDL: 82.9 mg/dL (ref >39.00)
LDL Cholesterol: 174 mg/dL — ABNORMAL HIGH (ref 0–99)
NonHDL: 204.13
Total CHOL/HDL Ratio: 3
Triglycerides: 153 mg/dL — ABNORMAL HIGH (ref 0.0–149.0)
VLDL: 30.6 mg/dL (ref 0.0–40.0)

## 2024-06-06 LAB — CBC WITH DIFFERENTIAL/PLATELET
Basophils Absolute: 0 K/uL (ref 0.0–0.1)
Basophils Relative: 0.4 % (ref 0.0–3.0)
Eosinophils Absolute: 0.1 K/uL (ref 0.0–0.7)
Eosinophils Relative: 0.8 % (ref 0.0–5.0)
HCT: 39.4 % (ref 36.0–46.0)
Hemoglobin: 13 g/dL (ref 12.0–15.0)
Lymphocytes Relative: 23.1 % (ref 12.0–46.0)
Lymphs Abs: 1.8 K/uL (ref 0.7–4.0)
MCHC: 33.1 g/dL (ref 30.0–36.0)
MCV: 86.3 fl (ref 78.0–100.0)
Monocytes Absolute: 0.5 K/uL (ref 0.1–1.0)
Monocytes Relative: 6.2 % (ref 3.0–12.0)
Neutro Abs: 5.3 K/uL (ref 1.4–7.7)
Neutrophils Relative %: 69.5 % (ref 43.0–77.0)
Platelets: 285 K/uL (ref 150.0–400.0)
RBC: 4.57 Mil/uL (ref 3.87–5.11)
RDW: 14.2 % (ref 11.5–15.5)
WBC: 7.7 K/uL (ref 4.0–10.5)

## 2024-06-06 LAB — URINALYSIS, ROUTINE W REFLEX MICROSCOPIC
Bilirubin Urine: NEGATIVE
Hgb urine dipstick: NEGATIVE
Ketones, ur: NEGATIVE
Leukocytes,Ua: NEGATIVE
Nitrite: NEGATIVE
RBC / HPF: NONE SEEN (ref 0–?)
Specific Gravity, Urine: 1.025 (ref 1.000–1.030)
Total Protein, Urine: NEGATIVE
Urine Glucose: NEGATIVE
Urobilinogen, UA: 0.2 (ref 0.0–1.0)
WBC, UA: NONE SEEN (ref 0–?)
pH: 5.5 (ref 5.0–8.0)

## 2024-06-06 LAB — BASIC METABOLIC PANEL WITH GFR
BUN: 17 mg/dL (ref 6–23)
CO2: 29 meq/L (ref 19–32)
Calcium: 9.4 mg/dL (ref 8.4–10.5)
Chloride: 102 meq/L (ref 96–112)
Creatinine, Ser: 0.84 mg/dL (ref 0.40–1.20)
GFR: 70.43 mL/min (ref >60.00)
Glucose, Bld: 86 mg/dL (ref 70–99)
Potassium: 3.8 meq/L (ref 3.5–5.1)
Sodium: 138 meq/L (ref 135–145)

## 2024-06-06 LAB — HEPATIC FUNCTION PANEL
ALT: 11 U/L (ref 0–35)
AST: 18 U/L (ref 0–37)
Albumin: 4.3 g/dL (ref 3.5–5.2)
Alkaline Phosphatase: 53 U/L (ref 39–117)
Bilirubin, Direct: 0 mg/dL (ref 0.0–0.3)
Total Bilirubin: 0.3 mg/dL (ref 0.2–1.2)
Total Protein: 7.1 g/dL (ref 6.0–8.3)

## 2024-06-06 LAB — LIPASE: Lipase: 28 U/L (ref 11.0–59.0)

## 2024-06-06 LAB — AMYLASE: Amylase: 36 U/L (ref 27–131)

## 2024-06-06 LAB — TSH: TSH: 2.44 u[IU]/mL (ref 0.35–5.50)

## 2024-06-06 LAB — C-REACTIVE PROTEIN: CRP: <0.5 mg/dL (ref 0.5–20.0)

## 2024-06-06 NOTE — Progress Notes (Signed)
 "  Subjective:  Patient ID: Emily Compton, female    DOB: 1955-09-11  Age: 68 y.o. MRN: [id number]  CC: Abdominal Pain   HPI Emily Compton presents for f/up --  Discussed the use of AI scribe software for clinical note transcription with the patient, who gave verbal consent to proceed.  History of Present Illness Emily Compton is a 68 year old female with ischemic colitis who presents with right upper quadrant pain.  She has been experiencing intermittent right upper quadrant pain for the past couple of months. The pain has varied in nature, being sharp, stabbing, or burning in the past, but is currently mostly dull. It does not worsen after eating, and she has not taken any specific medication for it due to uncertainty about its cause.  She has a history of ischemic colitis with a severe episode on Dec 29, 2022, and a less severe episode on March 29, 2024, characterized by diarrhea but not constipation. She reports that Dr. Marysue performed a colonoscopy in January 2025 and told her that there was nothing wrong and no scar tissue was found. During the initial episode, she experienced significant weight loss, but her weight has stabilized since then. She notes regular blood in her stools, which she attributes to her history of colitis.  She uses over-the-counter 20 mg proton pump inhibitors intermittently for heartburn or indigestion, initially daily for three weeks, then every other day, and now every few days. She has not used any anti-inflammatories like aspirin or Aleve.  No nausea, vomiting, trouble swallowing, painful swallowing, or changes in appetite. No fever, chills, chest pain, or shortness of breath, although she did experience a palpitation recently. No blood in her urine but notes regular blood in her stools.  She is active, having walked 102 miles last month, and feels she is in good shape for her age.     Outpatient Medications Prior to Visit  Medication Sig  Dispense Refill   oxyCODONE -acetaminophen  (PERCOCET) 5-325 MG tablet Take 1 tablet by mouth every 6 (six) hours as needed for severe pain (pain score 7-10). 75 tablet 0   diazepam  (VALIUM ) 10 MG tablet TAKE 1 TABLET BY MOUTH EVERY 12 HOURS AS NEEDED 60 tablet 1   hyoscyamine  (LEVSIN /SL) 0.125 MG SL tablet Place 1 tablet (0.125 mg total) under the tongue every 6 (six) hours as needed. 30 tablet 2   omeprazole  (PRILOSEC) 40 MG capsule Take 1 capsule (40 mg total) by mouth daily. (Patient not taking: Reported on 05/25/2023) 90 capsule 0   No facility-administered medications prior to visit.    ROS Review of Systems  Constitutional:  Negative for appetite change, chills, diaphoresis, fatigue and fever.  HENT:  Negative for sore throat and trouble swallowing.   Eyes: Negative.   Respiratory: Negative.  Negative for cough, chest tightness, shortness of breath and wheezing.   Cardiovascular:  Positive for palpitations. Negative for chest pain and leg swelling.  Gastrointestinal:  Positive for abdominal pain, blood in stool and diarrhea. Negative for constipation, nausea, rectal pain and vomiting.  Endocrine: Negative.   Genitourinary: Negative.  Negative for difficulty urinating, dysuria and hematuria.  Musculoskeletal: Negative.   Skin: Negative.   Neurological:  Negative for dizziness and weakness.  Hematological:  Negative for adenopathy. Does not bruise/bleed easily.  Psychiatric/Behavioral: Negative.      Objective:  BP 120/72 (BP Location: Left Arm, Patient Position: Sitting, Cuff Size: Small)   Pulse 67   Temp 98.1 F (36.7 C) (Oral)   Resp  16   Ht 4' 11.5 (1.511 m)   Wt 115 lb 6.4 oz (52.3 kg)   SpO2 97%   BMI 22.92 kg/m   BP Readings from Last 3 Encounters:  06/06/24 120/72  08/15/23 112/66  05/25/23 94/60    Wt Readings from Last 3 Encounters:  06/06/24 115 lb 6.4 oz (52.3 kg)  05/25/23 115 lb 4 oz (52.3 kg)  03/23/23 117 lb 9.6 oz (53.3 kg)    Physical  Exam Vitals reviewed.  Constitutional:      Appearance: Normal appearance.  HENT:     Nose: Nose normal.     Mouth/Throat:     Mouth: Mucous membranes are moist.  Eyes:     General: No scleral icterus.    Conjunctiva/sclera: Conjunctivae normal.  Cardiovascular:     Rate and Rhythm: Normal rate and regular rhythm. Occasional Extrasystoles are present.    Pulses: Normal pulses.     Heart sounds: No murmur heard.    No friction rub. No gallop.     Comments: EKG--- NSR with SA, 68 bpm No LVH, Q waves, or ST/T wave changes  Pulmonary:     Effort: Pulmonary effort is normal.     Breath sounds: No stridor. No wheezing, rhonchi or rales.  Abdominal:     General: Abdomen is flat. Bowel sounds are normal. There is no distension.     Palpations: Abdomen is soft. There is no hepatomegaly, splenomegaly or mass.     Tenderness: There is abdominal tenderness in the right upper quadrant. There is no guarding or rebound. Negative signs include Murphy's sign.     Hernia: No hernia is present.  Musculoskeletal:     Cervical back: Neck supple.     Right lower leg: No edema.     Left lower leg: No edema.  Skin:    General: Skin is warm and dry.     Findings: No rash.  Neurological:     General: No focal deficit present.     Mental Status: She is alert.  Psychiatric:        Mood and Affect: Mood normal.        Behavior: Behavior normal.     Lab Results  Component Value Date   WBC 7.7 06/06/2024   HGB 13.0 06/06/2024   HCT 39.4 06/06/2024   PLT 285.0 06/06/2024   GLUCOSE 86 06/06/2024   CHOL 287 (H) 06/06/2024   TRIG 153.0 (H) 06/06/2024   HDL 82.90 06/06/2024   LDLDIRECT 157.5 02/25/2013   LDLCALC 174 (H) 06/06/2024   ALT 11 06/06/2024   AST 18 06/06/2024   NA 138 06/06/2024   K 3.8 06/06/2024   CL 102 06/06/2024   CREATININE 0.84 06/06/2024   BUN 17 06/06/2024   CO2 29 06/06/2024   TSH 2.44 06/06/2024   INR 0.95 02/24/2011    CT Angio Abd/Pel W and/or Wo  Contrast Result Date: 12/29/2022 CLINICAL DATA:  Rectal bleeding.  History of anal cancer. EXAM: CTA ABDOMEN AND PELVIS WITHOUT AND WITH CONTRAST TECHNIQUE: Multidetector CT imaging of the abdomen and pelvis was performed using the standard protocol during bolus administration of intravenous contrast. Multiplanar reconstructed images and MIPs were obtained and reviewed to evaluate the vascular anatomy. RADIATION DOSE REDUCTION: This exam was performed according to the departmental dose-optimization program which includes automated exposure control, adjustment of the mA and/or kV according to patient size and/or use of iterative reconstruction technique. CONTRAST:  80mL OMNIPAQUE  IOHEXOL  350 MG/ML SOLN COMPARISON:  May 26, 2014. FINDINGS: VASCULAR Aorta: Normal caliber aorta without aneurysm, dissection, vasculitis or significant stenosis. Celiac: Patent without evidence of aneurysm, dissection, vasculitis or significant stenosis. SMA: Patent without evidence of aneurysm, dissection, vasculitis or significant stenosis. Renals: Both renal arteries are patent without evidence of aneurysm, dissection, vasculitis, fibromuscular dysplasia or significant stenosis. IMA: Patent without evidence of aneurysm, dissection, vasculitis or significant stenosis. Inflow: Patent without evidence of aneurysm, dissection, vasculitis or significant stenosis. Proximal Outflow: Bilateral common femoral and visualized portions of the superficial and profunda femoral arteries are patent without evidence of aneurysm, dissection, vasculitis or significant stenosis. Veins: No obvious venous abnormality within the limitations of this arterial phase study. Review of the MIP images confirms the above findings. NON-VASCULAR Lower chest: No acute abnormality. Hepatobiliary: No focal liver abnormality is seen. No gallstones, gallbladder wall thickening, or biliary dilatation. Pancreas: Unremarkable. No pancreatic ductal dilatation or surrounding  inflammatory changes. Spleen: Normal in size without focal abnormality. Adrenals/Urinary Tract: Adrenal glands are unremarkable. Kidneys are normal, without renal calculi, focal lesion, or hydronephrosis. Bladder is unremarkable. Stomach/Bowel: Stomach and appendix are unremarkable. There is no evidence of bowel obstruction. Moderate wall thickening with surrounding inflammatory changes is seen involving distal transverse colon consistent with infectious or inflammatory colitis. No extravasation of contrast into the gastrointestinal tract is noted to suggest bleeding. Lymphatic: No adenopathy is noted. Reproductive: Uterus and bilateral adnexa are unremarkable. Other: No abdominal wall hernia or abnormality. No abdominopelvic ascites. Musculoskeletal: Status post surgical posterior fusion of L4-5. No acute osseous abnormality is noted. IMPRESSION: VASCULAR No evidence of gastrointestinal bleeding. NON-VASCULAR Moderate wall thickening with surrounding inflammatory changes is seen involving distal transverse colon consistent with infectious or inflammatory colitis. Electronically Signed   By: Lynwood Landy Raddle M.D.   On: 12/29/2022 15:53   Estimated Creatinine Clearance: 43.7 mL/min (by C-G formula based on SCr of 0.84 mg/dL).   Assessment & Plan:   Intermittent palpitations -     TSH; Future -     Basic metabolic panel with GFR; Future -     CBC with Differential/Platelet; Future -     EKG 12-Lead  Gastroesophageal reflux disease without esophagitis -     CBC with Differential/Platelet; Future  Right upper quadrant abdominal tenderness without rebound tenderness- Labs are normal. Will evaluate for gallstones. -     Lipase; Future -     Amylase; Future -     Urinalysis, Routine w reflex microscopic; Future -     Hepatic function panel; Future -     C-reactive protein; Future -     US  ABDOMEN LIMITED RUQ (LIVER/GB); Future  Hyperlipidemia with target LDL less than 160 - Statin is not indicated. -      Lipid panel; Future  GAD (generalized anxiety disorder) -     clonazePAM ; Take 1 tablet (1 mg total) by mouth 3 (three) times daily.  Dispense: 270 tablet; Refill: 0     Follow-up: Return in about 3 weeks (around 06/27/2024).  Debby Molt, MD "

## 2024-06-06 NOTE — Patient Instructions (Signed)
 Abdominal Pain, Adult  Pain in the abdomen (abdominal pain) can be caused by many things. In most cases, it gets better with no treatment or by being treated at home. But in some cases, it can be serious. Your health care provider will ask questions about your medical history and do a physical exam to try to figure out what is causing your pain. Follow these instructions at home: Medicines Take over-the-counter and prescription medicines only as told by your provider. Do not take medicines that help you poop (laxatives) unless told by your provider. General instructions Watch your condition for any changes. Drink enough fluid to keep your pee (urine) pale yellow. Contact a health care provider if: Your pain changes, gets worse, or lasts longer than expected. You have severe cramping or bloating in your abdomen, or you vomit. Your pain gets worse with meals, after eating, or with certain foods. You are constipated or have diarrhea for more than 2-3 days. You are not hungry, or you lose weight without trying. You have signs of dehydration. These may include: Dark pee, very little pee, or no pee. Cracked lips or dry mouth. Sleepiness or weakness. You have pain when you pee (urinate) or poop. Your abdominal pain wakes you up at night. You have blood in your pee. You have a fever. Get help right away if: You cannot stop vomiting. Your pain is only in one part of the abdomen. Pain on the right side could be caused by appendicitis. You have bloody or black poop (stool), or poop that looks like tar. You have trouble breathing. You have chest pain. These symptoms may be an emergency. Get help right away. Call 911. Do not wait to see if the symptoms will go away. Do not drive yourself to the hospital. This information is not intended to replace advice given to you by your health care provider. Make sure you discuss any questions you have with your health care provider. Document Revised:  05/04/2022 Document Reviewed: 05/04/2022 Elsevier Patient Education  2024 [employer].

## 2024-06-07 ENCOUNTER — Other Ambulatory Visit: Payer: Self-pay | Admitting: Internal Medicine

## 2024-06-07 ENCOUNTER — Telehealth: Payer: Self-pay

## 2024-06-07 DIAGNOSIS — F411 Generalized anxiety disorder: Secondary | ICD-10-CM

## 2024-06-07 MED ORDER — ALPRAZOLAM 0.5 MG PO TABS: 0.5000 mg | ORAL_TABLET | Freq: Three times a day (TID) | ORAL | 0 refills | Status: AC | PRN

## 2024-06-07 MED ORDER — CLONAZEPAM 1 MG PO TABS: 1.0000 mg | ORAL_TABLET | Freq: Three times a day (TID) | ORAL | 0 refills | Status: DC

## 2024-06-07 NOTE — Progress Notes (Unsigned)
 Subjective:  Patient ID: Emily Compton, female    DOB: 06/04/1956  Age: 68 y.o. MRN: [id number]  CC: No chief complaint on file.   HPI Emily Compton presents for ***  Discussed the use of AI scribe software for clinical note transcription with the patient, who gave verbal consent to proceed.  History of Present Illness      Outpatient Medications Prior to Visit  Medication Sig Dispense Refill   oxyCODONE -acetaminophen  (PERCOCET) 5-325 MG tablet Take 1 tablet by mouth every 6 (six) hours as needed for severe pain (pain score 7-10). 75 tablet 0   clonazePAM (KLONOPIN) 1 MG tablet Take 1 tablet (1 mg total) by mouth 3 (three) times daily. 270 tablet 0   No facility-administered medications prior to visit.    ROS Review of Systems  Objective:  There were no vitals taken for this visit.  BP Readings from Last 3 Encounters:  06/06/24 120/72  08/15/23 112/66  05/25/23 94/60    Wt Readings from Last 3 Encounters:  06/06/24 115 lb 6.4 oz (52.3 kg)  05/25/23 115 lb 4 oz (52.3 kg)  03/23/23 117 lb 9.6 oz (53.3 kg)    Physical Exam  Lab Results  Component Value Date   WBC 7.7 06/06/2024   HGB 13.0 06/06/2024   HCT 39.4 06/06/2024   PLT 285.0 06/06/2024   GLUCOSE 86 06/06/2024   CHOL 287 (H) 06/06/2024   TRIG 153.0 (H) 06/06/2024   HDL 82.90 06/06/2024   LDLDIRECT 157.5 02/25/2013   LDLCALC 174 (H) 06/06/2024   ALT 11 06/06/2024   AST 18 06/06/2024   NA 138 06/06/2024   K 3.8 06/06/2024   CL 102 06/06/2024   CREATININE 0.84 06/06/2024   BUN 17 06/06/2024   CO2 29 06/06/2024   TSH 2.44 06/06/2024   INR 0.95 02/24/2011    CT Angio Abd/Pel W and/or Wo Contrast Result Date: 12/29/2022 CLINICAL DATA:  Rectal bleeding.  History of anal cancer. EXAM: CTA ABDOMEN AND PELVIS WITHOUT AND WITH CONTRAST TECHNIQUE: Multidetector CT imaging of the abdomen and pelvis was performed using the standard protocol during bolus administration of intravenous contrast.  Multiplanar reconstructed images and MIPs were obtained and reviewed to evaluate the vascular anatomy. RADIATION DOSE REDUCTION: This exam was performed according to the departmental dose-optimization program which includes automated exposure control, adjustment of the mA and/or kV according to patient size and/or use of iterative reconstruction technique. CONTRAST:  80mL OMNIPAQUE  IOHEXOL  350 MG/ML SOLN COMPARISON:  May 26, 2014. FINDINGS: VASCULAR Aorta: Normal caliber aorta without aneurysm, dissection, vasculitis or significant stenosis. Celiac: Patent without evidence of aneurysm, dissection, vasculitis or significant stenosis. SMA: Patent without evidence of aneurysm, dissection, vasculitis or significant stenosis. Renals: Both renal arteries are patent without evidence of aneurysm, dissection, vasculitis, fibromuscular dysplasia or significant stenosis. IMA: Patent without evidence of aneurysm, dissection, vasculitis or significant stenosis. Inflow: Patent without evidence of aneurysm, dissection, vasculitis or significant stenosis. Proximal Outflow: Bilateral common femoral and visualized portions of the superficial and profunda femoral arteries are patent without evidence of aneurysm, dissection, vasculitis or significant stenosis. Veins: No obvious venous abnormality within the limitations of this arterial phase study. Review of the MIP images confirms the above findings. NON-VASCULAR Lower chest: No acute abnormality. Hepatobiliary: No focal liver abnormality is seen. No gallstones, gallbladder wall thickening, or biliary dilatation. Pancreas: Unremarkable. No pancreatic ductal dilatation or surrounding inflammatory changes. Spleen: Normal in size without focal abnormality. Adrenals/Urinary Tract: Adrenal glands are unremarkable. Kidneys are normal, without renal  calculi, focal lesion, or hydronephrosis. Bladder is unremarkable. Stomach/Bowel: Stomach and appendix are unremarkable. There is no  evidence of bowel obstruction. Moderate wall thickening with surrounding inflammatory changes is seen involving distal transverse colon consistent with infectious or inflammatory colitis. No extravasation of contrast into the gastrointestinal tract is noted to suggest bleeding. Lymphatic: No adenopathy is noted. Reproductive: Uterus and bilateral adnexa are unremarkable. Other: No abdominal wall hernia or abnormality. No abdominopelvic ascites. Musculoskeletal: Status post surgical posterior fusion of L4-5. No acute osseous abnormality is noted. IMPRESSION: VASCULAR No evidence of gastrointestinal bleeding. NON-VASCULAR Moderate wall thickening with surrounding inflammatory changes is seen involving distal transverse colon consistent with infectious or inflammatory colitis. Electronically Signed   By: Lynwood Landy Raddle M.D.   On: 12/29/2022 15:53    Assessment & Plan:  GAD (generalized anxiety disorder) -     ALPRAZolam; Take 1 tablet (0.5 mg total) by mouth 3 (three) times daily as needed for anxiety.  Dispense: 270 tablet; Refill: 0     Follow-up: No follow-ups on file.  Emily Molt, MD

## 2024-06-07 NOTE — Telephone Encounter (Signed)
 Copied from CRM [id number]. Topic: Clinical - Medication Question >> Jun 07, 2024 11:03 AM Carlyon D wrote: Reason for CRM: [facility] the pharmacist  is calling from [facility], Got script for clonazepam this morning 11/7 then received a script for Alprazolam a today as well. She is asking if both are suppose to be filled or if one is to be canceled as they a re in the same category of meds. Please reach out to madison at [facility] # [phone]

## 2024-06-10 NOTE — Telephone Encounter (Signed)
 I have confirmed that the only script that the patient is supposed to be on is the Xanax PER HER MEDICATION LIST in her chart. The pharmacist Adam gave me a verbal understanding.

## 2024-06-11 ENCOUNTER — Encounter: Payer: Self-pay | Admitting: Internal Medicine

## 2024-06-11 ENCOUNTER — Other Ambulatory Visit: Payer: Self-pay

## 2024-06-11 DIAGNOSIS — M48061 Spinal stenosis, lumbar region without neurogenic claudication: Secondary | ICD-10-CM

## 2024-06-11 MED ORDER — OXYCODONE-ACETAMINOPHEN 5-325 MG PO TABS: 1.0000 | ORAL_TABLET | Freq: Four times a day (QID) | ORAL | 0 refills | Status: AC | PRN

## 2024-06-13 ENCOUNTER — Ambulatory Visit
Admission: RE | Admit: 2024-06-13 | Discharge: 2024-06-13 | Disposition: A | Discharge status: Home / Self Care | Source: Ambulatory Visit | Attending: Internal Medicine | Admitting: Internal Medicine

## 2024-06-13 ENCOUNTER — Other Ambulatory Visit: Payer: Self-pay | Admitting: Internal Medicine

## 2024-06-13 DIAGNOSIS — R10811 Right upper quadrant abdominal tenderness: Secondary | ICD-10-CM

## 2024-06-13 DIAGNOSIS — K8020 Calculus of gallbladder without cholecystitis without obstruction: Secondary | ICD-10-CM | POA: Diagnosis not present

## 2024-06-17 ENCOUNTER — Ambulatory Visit: Payer: Self-pay | Admitting: Surgery

## 2024-06-17 DIAGNOSIS — K8010 Calculus of gallbladder with chronic cholecystitis without obstruction: Secondary | ICD-10-CM | POA: Diagnosis not present

## 2024-06-17 DIAGNOSIS — Z8719 Personal history of other diseases of the digestive system: Secondary | ICD-10-CM | POA: Diagnosis not present
